# Patient Record
Sex: Female | Born: 1995 | Race: White | Hispanic: No | Marital: Married | Smoking: Never smoker
Health system: Southern US, Community
[De-identification: ages and names within clinical notes are randomized; demographics above are authoritative.]

## PROBLEM LIST (undated history)

## (undated) DIAGNOSIS — B20 Human immunodeficiency virus [HIV] disease: Secondary | ICD-10-CM

## (undated) DIAGNOSIS — Z21 Asymptomatic human immunodeficiency virus [HIV] infection status: Secondary | ICD-10-CM

## (undated) DIAGNOSIS — J45909 Unspecified asthma, uncomplicated: Secondary | ICD-10-CM

## (undated) DIAGNOSIS — E786 Lipoprotein deficiency: Secondary | ICD-10-CM

## (undated) DIAGNOSIS — A6000 Herpesviral infection of urogenital system, unspecified: Secondary | ICD-10-CM

## (undated) HISTORY — DX: Unspecified asthma, uncomplicated: J45.909

## (undated) HISTORY — DX: Herpesviral infection of urogenital system, unspecified: A60.00

## (undated) HISTORY — PX: APPENDECTOMY: SHX54

---
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
## 2013-01-09 ENCOUNTER — Emergency Department: Payer: Self-pay | Admitting: Emergency Medicine

## 2013-01-09 LAB — COMPREHENSIVE METABOLIC PANEL
Albumin: 3.1 g/dL — ABNORMAL LOW (ref 3.8–5.6)
Alkaline Phosphatase: 88 U/L (ref 82–169)
BUN: 9 mg/dL (ref 9–21)
Co2: 25 mmol/L (ref 16–25)
Creatinine: 0.6 mg/dL (ref 0.60–1.30)
EGFR (Non-African Amer.): >60
Sodium: 136 mmol/L (ref 132–141)
Total Protein: 7 g/dL (ref 6.4–8.6)

## 2013-01-09 LAB — CBC
HCT: 31.3 % — ABNORMAL LOW (ref 35.0–47.0)
HGB: 10.9 g/dL — ABNORMAL LOW (ref 12.0–16.0)
MCH: 28.7 pg (ref 26.0–34.0)
MCHC: 34.8 g/dL (ref 32.0–36.0)
MCV: 82 fL (ref 80–100)
RBC: 3.8 10*6/uL (ref 3.80–5.20)
RDW: 13.6 % (ref 11.5–14.5)
WBC: 10.2 10*3/uL (ref 3.6–11.0)

## 2013-01-09 LAB — URINALYSIS, COMPLETE
Nitrite: NEGATIVE
Ph: 7 (ref 4.5–8.0)
Protein: NEGATIVE
RBC,UR: <1 /HPF (ref 0–5)
Squamous Epithelial: <1

## 2013-05-17 DIAGNOSIS — Z5948 Other specified lack of adequate food: Secondary | ICD-10-CM | POA: Insufficient documentation

## 2013-10-06 DIAGNOSIS — B009 Herpesviral infection, unspecified: Secondary | ICD-10-CM | POA: Insufficient documentation

## 2013-10-06 HISTORY — DX: Herpesviral infection, unspecified: B00.9

## 2014-01-20 ENCOUNTER — Emergency Department: Payer: Self-pay | Admitting: Emergency Medicine

## 2014-03-27 ENCOUNTER — Emergency Department: Payer: Self-pay | Admitting: Internal Medicine

## 2014-07-25 ENCOUNTER — Emergency Department
Admit: 2014-07-25 | Disposition: A | Discharge status: Home / Self Care | Payer: Self-pay | Admitting: Emergency Medicine

## 2014-07-25 LAB — CBC
HCT: 40.9 % (ref 35.0–47.0)
HGB: 13.6 g/dL (ref 12.0–16.0)
MCH: 28.3 pg (ref 26.0–34.0)
MCHC: 33.4 g/dL (ref 32.0–36.0)
MCV: 85 fL (ref 80–100)
Platelet: 333 10*3/uL (ref 150–440)
RBC: 4.82 10*6/uL (ref 3.80–5.20)
RDW: 13.1 % (ref 11.5–14.5)
WBC: 7.3 10*3/uL (ref 3.6–11.0)

## 2014-07-25 LAB — BASIC METABOLIC PANEL
Anion Gap: 7 (ref 7–16)
BUN: 15 mg/dL
CREATININE: 0.58 mg/dL
Calcium, Total: 9.4 mg/dL
Chloride: 105 mmol/L
Co2: 27 mmol/L
EGFR (African American): >60
EGFR (Non-African Amer.): >60
Glucose: 87 mg/dL
Potassium: 4 mmol/L
Sodium: 139 mmol/L

## 2014-07-25 LAB — D-DIMER(ARMC): D-Dimer: 132 ng/ml

## 2014-07-25 LAB — TROPONIN I

## 2014-09-08 ENCOUNTER — Emergency Department
Admission: EM | Admit: 2014-09-08 | Discharge: 2014-09-08 | Disposition: A | Discharge status: Home / Self Care | Payer: Medicaid Other | Attending: Emergency Medicine | Admitting: Emergency Medicine

## 2014-09-08 ENCOUNTER — Encounter: Payer: Self-pay | Admitting: Emergency Medicine

## 2014-09-08 DIAGNOSIS — Z880 Allergy status to penicillin: Secondary | ICD-10-CM | POA: Diagnosis not present

## 2014-09-08 DIAGNOSIS — H578 Other specified disorders of eye and adnexa: Secondary | ICD-10-CM | POA: Diagnosis present

## 2014-09-08 DIAGNOSIS — Z79899 Other long term (current) drug therapy: Secondary | ICD-10-CM | POA: Diagnosis not present

## 2014-09-08 DIAGNOSIS — H109 Unspecified conjunctivitis: Secondary | ICD-10-CM | POA: Insufficient documentation

## 2014-09-08 HISTORY — DX: Asymptomatic human immunodeficiency virus (hiv) infection status: Z21

## 2014-09-08 HISTORY — DX: Human immunodeficiency virus (HIV) disease: B20

## 2014-09-08 MED ORDER — TOBRAMYCIN 0.3 % OP SOLN: 1.0000 [drp] | OPHTHALMIC | Status: AC

## 2014-09-08 MED ORDER — TETRACAINE HCL 0.5 % OP SOLN
OPHTHALMIC | Status: AC
  Filled 2014-09-08: qty 2

## 2014-09-08 MED ORDER — EYE WASH OPHTH SOLN
OPHTHALMIC | Status: AC
  Filled 2014-09-08: qty 118

## 2014-09-08 MED ORDER — FLUORESCEIN SODIUM 1 MG OP STRP
ORAL_STRIP | OPHTHALMIC | Status: AC
  Filled 2014-09-08: qty 2

## 2014-09-08 NOTE — ED Notes (Signed)
Sclera reddened 

## 2014-09-08 NOTE — ED Notes (Signed)
Noted to keep L eye closed more than R.

## 2014-09-08 NOTE — ED Provider Notes (Signed)
CSN: [id number]     Arrival date & time 09/08/14  1103 History   First MD Initiated Contact with Patient 09/08/14 1138     Chief Complaint  Patient presents with  . Eye Problem    L eye irritation x 3 days, denies pain, itching or visual disturbance, sent by boss for clearance for work     (Consider location/radiation/quality/duration/timing/severity/associated sxs/prior Treatment) HPI Comments: 19 year old HIV positive female presents today for left eye irritation and swelling for the past few days. She has chronic issues with decreased far vision in her left eye. No injury that she is aware of to the eye. She is not having any blurred vision or eye pain.   Patient is a 19 y.o. female presenting with eye problem. The history is provided by the patient.  Eye Problem Location:  L eye Quality:  Aching Severity:  Mild Onset quality:  Gradual Duration:  3 days Timing:  Constant Progression:  Worsening Chronicity:  New Context: not burn, not chemical exposure, not contact lens problem, not direct trauma, not foreign body, not using machinery, not scratch, not smoke exposure and not tanning booth use   Relieved by:  Nothing Worsened by:  Bright light Ineffective treatments:  None tried Associated symptoms: crusting, discharge, inflammation, itching, swelling and tearing   Associated symptoms: no blurred vision, no decreased vision, no double vision, no foreign body sensation, no photophobia and no vomiting   Risk factors: not exposed to pinkeye, no previous injury to eye and no recent URI     Past Medical History  Diagnosis Date  . HIV (human immunodeficiency virus infection)    History reviewed. No pertinent past surgical history. No family history on file. History  Substance Use Topics  . Smoking status: Never Smoker   . Smokeless tobacco: Not on file  . Alcohol Use: No   OB History    No data available     Review of Systems  Eyes: Positive for discharge and itching.  Negative for blurred vision, double vision and photophobia.  Gastrointestinal: Negative for vomiting.  All other systems reviewed and are negative.     Allergies  Penicillins  Home Medications   Prior to Admission medications   Medication Sig Start Date End Date Taking? Authorizing Provider  darunavir (PREZISTA) 400 MG tablet Take 400 mg by mouth.   Yes Historical Provider, MD  emtricitabine-tenofovir (TRUVADA) 200-300 MG per tablet Take 1 tablet by mouth daily.   Yes Historical Provider, MD  tobramycin (TOBREX) 0.3 % ophthalmic solution Place 1 drop into the left eye every 4 (four) hours. 09/08/14 09/18/14  Wilber Oliphant V, PA-C   BP 106/57 mmHg  Pulse 102  Temp(Src) 99.1 F (37.3 C) (Oral)  Resp 18  Ht  (1.448 m)  Wt 100 lb (45.36 kg)  BMI 21.63 kg/m2  SpO2 99%  LMP 08/15/2014 (Exact Date) Physical Exam  Constitutional: She is oriented to person, place, and time. She appears well-developed and well-nourished.  HENT:  Head: Normocephalic and atraumatic.  Right Ear: Tympanic membrane and external ear normal.  Left Ear: Tympanic membrane and external ear normal.  Nose: Nose normal.  Mouth/Throat: Oropharynx is clear and moist.  Eyes: EOM are normal. Pupils are equal, round, and reactive to light. Lids are everted and swept, no foreign bodies found. Right eye exhibits no discharge. Left eye exhibits discharge. Left conjunctiva is injected.  Slit lamp exam:      The right eye shows no fluorescein uptake.  The left eye shows no corneal abrasion, no corneal flare, no corneal ulcer, no foreign body and no fluorescein uptake.  Neck: Normal range of motion. Neck supple.  Musculoskeletal: Normal range of motion.  Neurological: She is alert and oriented to person, place, and time.  Skin: Skin is warm and dry.  Psychiatric: She has a normal mood and affect. Her behavior is normal. Judgment and thought content normal.  Nursing note and vitals reviewed.   ED Course   Procedures (including critical care time) Labs Review Labs Reviewed - No data to display  Imaging Review No results found.   EKG Interpretation None      MDM  Fluorescein exam performed, no obvious injury to eye. Treat for conjunctivitis. Tobrex 10 day course.Return for eye pain, vision problems, vomiting  Final diagnoses:  Conjunctivitis, left eye       Luvenia Reddenmma Weavil V, PA-C 09/08/14 1153  Governor Rooksebecca Lord, MD 09/10/14 [phone]

## 2014-09-08 NOTE — Discharge Instructions (Signed)

## 2014-10-01 ENCOUNTER — Emergency Department
Admission: EM | Admit: 2014-10-01 | Discharge: 2014-10-01 | Disposition: A | Discharge status: Home / Self Care | Payer: Medicaid Other | Attending: Emergency Medicine | Admitting: Emergency Medicine

## 2014-10-01 DIAGNOSIS — Z880 Allergy status to penicillin: Secondary | ICD-10-CM | POA: Diagnosis not present

## 2014-10-01 DIAGNOSIS — J069 Acute upper respiratory infection, unspecified: Secondary | ICD-10-CM | POA: Insufficient documentation

## 2014-10-01 DIAGNOSIS — Z21 Asymptomatic human immunodeficiency virus [HIV] infection status: Secondary | ICD-10-CM | POA: Insufficient documentation

## 2014-10-01 DIAGNOSIS — Z79899 Other long term (current) drug therapy: Secondary | ICD-10-CM | POA: Diagnosis not present

## 2014-10-01 DIAGNOSIS — R05 Cough: Secondary | ICD-10-CM | POA: Diagnosis present

## 2014-10-01 MED ORDER — AZITHROMYCIN 250 MG PO TABS
ORAL_TABLET | ORAL | Status: AC
  Administered 2014-10-01: 500 mg via ORAL
  Filled 2014-10-01: qty 2

## 2014-10-01 MED ORDER — AZITHROMYCIN 250 MG PO TABS: 250.0000 mg | ORAL_TABLET | Freq: Every day | ORAL | Status: DC

## 2014-10-01 MED ORDER — AZITHROMYCIN 250 MG PO TABS
500.0000 mg | ORAL_TABLET | Freq: Once | ORAL | Status: AC
  Administered 2014-10-01: 500 mg via ORAL

## 2014-10-01 MED ORDER — HYDROCOD POLST-CPM POLST ER 10-8 MG/5ML PO SUER: 5.0000 mL | Freq: Two times a day (BID) | ORAL | Status: DC

## 2014-10-01 NOTE — Discharge Instructions (Signed)
1. Finish antibiotics as prescribed (erythromycin 250 mg daily 4 days). 2. Take cough medicine as needed (Tussionex). 3. Return to the ER for worsening symptoms, persistent vomiting, fever, difficulty breathing or other concerns.  Upper Respiratory Infection, Adult An upper respiratory infection (URI) is also sometimes known as the common cold. The upper respiratory tract includes the nose, sinuses, throat, trachea, and bronchi. Bronchi are the airways leading to the lungs. Most people improve within 1 week, but symptoms can last up to 2 weeks. A residual cough may last even longer.  CAUSES Many different viruses can infect the tissues lining the upper respiratory tract. The tissues become irritated and inflamed and often become very moist. Mucus production is also common. A cold is contagious. You can easily spread the virus to others by oral contact. This includes kissing, sharing a glass, coughing, or sneezing. Touching your mouth or nose and then touching a surface, which is then touched by another person, can also spread the virus. SYMPTOMS  Symptoms typically develop 1 to 3 days after you come in contact with a cold virus. Symptoms vary from person to person. They may include:  Runny nose.  Sneezing.  Nasal congestion.  Sinus irritation.  Sore throat.  Loss of voice (laryngitis).  Cough.  Fatigue.  Muscle aches.  Loss of appetite.  Headache.  Low-grade fever. DIAGNOSIS  You might diagnose your own cold based on familiar symptoms, since most people get a cold 2 to 3 times a year. Your caregiver can confirm this based on your exam. Most importantly, your caregiver can check that your symptoms are not due to another disease such as strep throat, sinusitis, pneumonia, asthma, or epiglottitis. Blood tests, throat tests, and X-rays are not necessary to diagnose a common cold, but they may sometimes be helpful in excluding other more serious diseases. Your caregiver will decide if  any further tests are required. RISKS AND COMPLICATIONS  You may be at risk for a more severe case of the common cold if you smoke cigarettes, have chronic heart disease (such as heart failure) or lung disease (such as asthma), or if you have a weakened immune system. The very young and very old are also at risk for more serious infections. Bacterial sinusitis, middle ear infections, and bacterial pneumonia can complicate the common cold. The common cold can worsen asthma and chronic obstructive pulmonary disease (COPD). Sometimes, these complications can require emergency medical care and may be life-threatening. PREVENTION  The best way to protect against getting a cold is to practice good hygiene. Avoid oral or hand contact with people with cold symptoms. Wash your hands often if contact occurs. There is no clear evidence that vitamin C, vitamin E, echinacea, or exercise reduces the chance of developing a cold. However, it is always recommended to get plenty of rest and practice good nutrition. TREATMENT  Treatment is directed at relieving symptoms. There is no cure. Antibiotics are not effective, because the infection is caused by a virus, not by bacteria. Treatment may include:  Increased fluid intake. Sports drinks offer valuable electrolytes, sugars, and fluids.  Breathing heated mist or steam (vaporizer or shower).  Eating chicken soup or other clear broths, and maintaining good nutrition.  Getting plenty of rest.  Using gargles or lozenges for comfort.  Controlling fevers with ibuprofen or acetaminophen as directed by your caregiver.  Increasing usage of your inhaler if you have asthma. Zinc gel and zinc lozenges, taken in the first 24 hours of the common cold, can  shorten the duration and lessen the severity of symptoms. Pain medicines may help with fever, muscle aches, and throat pain. A variety of non-prescription medicines are available to treat congestion and runny nose. Your  caregiver can make recommendations and may suggest nasal or lung inhalers for other symptoms.  HOME CARE INSTRUCTIONS   Only take over-the-counter or prescription medicines for pain, discomfort, or fever as directed by your caregiver.  Use a warm mist humidifier or inhale steam from a shower to increase air moisture. This may keep secretions moist and make it easier to breathe.  Drink enough water and fluids to keep your urine clear or pale yellow.  Rest as needed.  Return to work when your temperature has returned to normal or as your caregiver advises. You may need to stay home longer to avoid infecting others. You can also use a face mask and careful hand washing to prevent spread of the virus. SEEK MEDICAL CARE IF:   After the first few days, you feel you are getting worse rather than better.  You need your caregiver's advice about medicines to control symptoms.  You develop chills, worsening shortness of breath, or brown or red sputum. These may be signs of pneumonia.  You develop yellow or brown nasal discharge or pain in the face, especially when you bend forward. These may be signs of sinusitis.  You develop a fever, swollen neck glands, pain with swallowing, or white areas in the back of your throat. These may be signs of strep throat. SEEK IMMEDIATE MEDICAL CARE IF:   You have a fever.  You develop severe or persistent headache, ear pain, sinus pain, or chest pain.  You develop wheezing, a prolonged cough, cough up blood, or have a change in your usual mucus (if you have chronic lung disease).  You develop sore muscles or a stiff neck. Document Released: 09/30/2000 Document Revised: 06/29/2011 Document Reviewed: 07/12/2013 [facility] Patient Information 2015 [place], [place]. This information is not intended to replace advice given to you by your health care provider. Make sure you discuss any questions you have with your health care provider.

## 2014-10-01 NOTE — ED Notes (Signed)
Pt states that she has been coughing, hoarse, and some diarrhea, daughter here with similar complaints

## 2014-10-01 NOTE — ED Provider Notes (Signed)
[facility] Emergency Department Provider Note  ____________________________________________  Time seen: Approximately 5:32 AM  I have reviewed the triage vital signs and the nursing notes.   HISTORY  Chief Complaint Cough    HPI Erica Walton is a 19 y.o. female who presents with a one-week history of worsening fever, cough, sore throat and diarrhea. Patient states she had fevers and chills for the first day of her illness, none subsequently. Cough has progressed to a productive one of yellow sputum with sore throat, hoarseness and occasional diarrhea. Her daughter is here with similar complaints. Denies headache, chest pain, abdominal pain, vomiting, weakness, numbness, tingling.Denies recent foreign travel.   Past Medical History  Diagnosis Date  . HIV (human immunodeficiency virus infection)     There are no active problems to display for this patient.   No past surgical history on file.  Current Outpatient Rx  Name  Route  Sig  Dispense  Refill  . darunavir (PREZISTA) 400 MG tablet   Oral   Take 400 mg by mouth.         Marland Kitchen emtricitabine-tenofovir (TRUVADA) 200-300 MG per tablet   Oral   Take 1 tablet by mouth daily.           Allergies Penicillins  No family history on file.  Social History History  Substance Use Topics  . Smoking status: Never Smoker   . Smokeless tobacco: Not on file  . Alcohol Use: No    Review of Systems Constitutional: No fever/chills Eyes: No visual changes. ENT: Positive for sore throat. Cardiovascular: Denies chest pain. Respiratory: Positive for productive cough. Denies shortness of breath. Gastrointestinal: No abdominal pain.  No nausea, no vomiting.  No diarrhea.  No constipation. Genitourinary: Negative for dysuria. Musculoskeletal: Negative for back pain. Skin: Negative for rash. Neurological: Negative for headaches, focal weakness or numbness.  10-point ROS otherwise  negative.  ____________________________________________   PHYSICAL EXAM:  VITAL SIGNS: ED Triage Vitals  Enc Vitals Group     BP 10/01/14 0101 109/74 mmHg     Pulse Rate 10/01/14 0101 87     Resp --      Temp 10/01/14 0101 98.8 F (37.1 C)     Temp Source 10/01/14 0101 Oral     SpO2 10/01/14 0101 97 %     Weight 10/01/14 0101 95 lb (43.092 kg)     Height 10/01/14 0101 4\' 10"  (1.473 m)     Head Cir --      Peak Flow --      Pain Score 10/01/14 0102 8     Pain Loc --      Pain Edu? --      Excl. in GC? --     Constitutional: Alert and oriented. Well appearing and in no acute distress. Eyes: Conjunctivae are normal. PERRL. EOMI. Head: Atraumatic. Nose: No congestion/rhinnorhea. Mouth/Throat: Mucous membranes are moist.  Oropharynx mildly erythematous without tonsillar swelling. No peritonsillar abscess. No drooling. No hoarse voice.. Neck: No stridor.   Cardiovascular: Normal rate, regular rhythm. Grossly normal heart sounds.  Good peripheral circulation. Respiratory: Normal respiratory effort.  No retractions. Lungs CTAB. Gastrointestinal: Soft and nontender. No distention. No abdominal bruits. No CVA tenderness. Musculoskeletal: No lower extremity tenderness nor edema.  No joint effusions. Neurologic:  Normal speech and language. No gross focal neurologic deficits are appreciated. Speech is normal. No gait instability. Skin:  Skin is warm, dry and intact. No rash noted. Psychiatric: Mood and affect are normal. Speech  and behavior are normal.  ____________________________________________   LABS (all labs ordered are listed, but only abnormal results are displayed)  Labs Reviewed - No data to display ____________________________________________  EKG  None ____________________________________________  RADIOLOGY  None ____________________________________________   PROCEDURES  Procedure(s) performed: None  Critical Care performed:  No  ____________________________________________   INITIAL IMPRESSION / ASSESSMENT AND PLAN / ED COURSE  Pertinent labs & imaging results that were available during my care of the patient were reviewed by me and considered in my medical decision making (see chart for details).  19 year old female with worsening cough and congestion for one week. Afebrile, room air sats 97%. Will treat with Z-Pak, Tussionex, follow-up PCP. Strict return precautions given. Patient verbalizes understanding and agree with plan of care. ____________________________________________   FINAL CLINICAL IMPRESSION(S) / ED DIAGNOSES  Final diagnoses:  URI (upper respiratory infection)      Irean Hong, MD 10/01/14 0745

## 2015-04-03 ENCOUNTER — Emergency Department: Payer: Medicaid Other

## 2015-04-03 ENCOUNTER — Encounter: Payer: Self-pay | Admitting: Emergency Medicine

## 2015-04-03 ENCOUNTER — Emergency Department
Admission: EM | Admit: 2015-04-03 | Discharge: 2015-04-04 | Disposition: A | Discharge status: Home / Self Care | Payer: Medicaid Other | Attending: Emergency Medicine | Admitting: Emergency Medicine

## 2015-04-03 DIAGNOSIS — Z79899 Other long term (current) drug therapy: Secondary | ICD-10-CM | POA: Insufficient documentation

## 2015-04-03 DIAGNOSIS — S0083XA Contusion of other part of head, initial encounter: Secondary | ICD-10-CM

## 2015-04-03 DIAGNOSIS — S0990XA Unspecified injury of head, initial encounter: Secondary | ICD-10-CM | POA: Diagnosis present

## 2015-04-03 DIAGNOSIS — Y998 Other external cause status: Secondary | ICD-10-CM | POA: Diagnosis not present

## 2015-04-03 DIAGNOSIS — S060X1A Concussion with loss of consciousness of 30 minutes or less, initial encounter: Secondary | ICD-10-CM | POA: Diagnosis not present

## 2015-04-03 DIAGNOSIS — W01198A Fall on same level from slipping, tripping and stumbling with subsequent striking against other object, initial encounter: Secondary | ICD-10-CM | POA: Diagnosis not present

## 2015-04-03 DIAGNOSIS — Z880 Allergy status to penicillin: Secondary | ICD-10-CM | POA: Insufficient documentation

## 2015-04-03 DIAGNOSIS — Y9389 Activity, other specified: Secondary | ICD-10-CM | POA: Diagnosis not present

## 2015-04-03 DIAGNOSIS — Y9289 Other specified places as the place of occurrence of the external cause: Secondary | ICD-10-CM | POA: Diagnosis not present

## 2015-04-03 LAB — CBC WITH DIFFERENTIAL/PLATELET
BASOS ABS: 0 10*3/uL (ref 0–0.1)
BASOS PCT: 0 %
EOS ABS: 0.4 10*3/uL (ref 0–0.7)
Eosinophils Relative: 3 %
HEMATOCRIT: 43 % (ref 35.0–47.0)
HEMOGLOBIN: 14.4 g/dL (ref 12.0–16.0)
Lymphocytes Relative: 14 %
Lymphs Abs: 1.9 10*3/uL (ref 1.0–3.6)
MCH: 28.1 pg (ref 26.0–34.0)
MCHC: 33.4 g/dL (ref 32.0–36.0)
MCV: 84.2 fL (ref 80.0–100.0)
MONOS PCT: 5 %
Monocytes Absolute: 0.7 10*3/uL (ref 0.2–0.9)
NEUTROS ABS: 10.4 10*3/uL — AB (ref 1.4–6.5)
NEUTROS PCT: 78 %
Platelets: 315 10*3/uL (ref 150–440)
RBC: 5.11 MIL/uL (ref 3.80–5.20)
RDW: 12.3 % (ref 11.5–14.5)
WBC: 13.5 10*3/uL — AB (ref 3.6–11.0)

## 2015-04-03 LAB — BASIC METABOLIC PANEL
Anion gap: 4 — ABNORMAL LOW (ref 5–15)
BUN: 11 mg/dL (ref 6–20)
CALCIUM: 9.2 mg/dL (ref 8.9–10.3)
CHLORIDE: 110 mmol/L (ref 101–111)
CO2: 24 mmol/L (ref 22–32)
Creatinine, Ser: 0.57 mg/dL (ref 0.44–1.00)
GFR calc Af Amer: >60 mL/min (ref >60)
GFR calc non Af Amer: >60 mL/min (ref >60)
Glucose, Bld: 103 mg/dL — ABNORMAL HIGH (ref 65–99)
POTASSIUM: 4 mmol/L (ref 3.5–5.1)
Sodium: 138 mmol/L (ref 135–145)

## 2015-04-03 LAB — ETHANOL

## 2015-04-03 NOTE — ED Notes (Signed)
Pt arrived to the ED accompanied by family member for a head injury after sustaining a fall. Pt is Alert but not oriented. ED MD notified.

## 2015-04-03 NOTE — ED Provider Notes (Signed)
[facility] Emergency Department Provider Note   ____________________________________________  Time seen: Upon arrival I have reviewed the triage vital signs and the triage nursing note.  HISTORY  Chief Complaint Head Injury   Historian Patient is altered mental status, history obtained from roommate  HPI Erica Walton is a 19 y.o. female was brought in by roommate after head injury. Reportedly the patient slipped on the floor and struck her head against the wall. Reported short loss of consciousness followed by confusion which is persisted. Currently patient is confused but does state she has a headache. No additional reported recent illnesses.    Past Medical History  Diagnosis Date  . HIV (human immunodeficiency virus infection) (HCC)     There are no active problems to display for this patient.   No past surgical history on file.  Current Outpatient Rx  Name  Route  Sig  Dispense  Refill  . darunavir (PREZISTA) 400 MG tablet   Oral   Take 400 mg by mouth.         Marland Kitchen emtricitabine-tenofovir (TRUVADA) 200-300 MG per tablet   Oral   Take 1 tablet by mouth daily.           Allergies Penicillins  No family history on file.  Social History Social History  Substance Use Topics  . Smoking status: Never Smoker   . Smokeless tobacco: None  . Alcohol Use: No    Review of Systems Limited due to altered mental status Constitutional:  Eyes:  ENT: Cardiovascular:  Respiratory:  Gastrointestinal: Genitourinary: Musculoskeletal:  Skin: Neurological: Positive for headache. 10 point Review of Systems otherwise negative ____________________________________________   PHYSICAL EXAM:  VITAL SIGNS: ED Triage Vitals  Enc Vitals Group     BP 04/03/15 2231 108/75 mmHg     Pulse Rate 04/03/15 2231 102     Resp 04/03/15 2231 16     Temp 04/03/15 2231 97.8 F (36.6 C)     Temp Source 04/03/15 2231 Oral     SpO2 04/03/15 2231 98 %      Weight 04/03/15 2231 115 lb (52.164 kg)     Height 04/03/15 2231 5' (1.524 m)     Head Cir --      Peak Flow --      Pain Score --      Pain Loc --      Pain Edu? --      Excl. in GC? --      Constitutional: Alert, confused.  In no distress. Eyes: Conjunctivae are normal. PERRL. Normal extraocular movements. ENT   Head: Forehead hematoma extending to the bridge of nose. No nasal septal hematoma.   Nose: No congestion/rhinnorhea.   Mouth/Throat: Mucous membranes are moist.   Neck: No stridor. No midline she's been tenderness or step-off. Cardiovascular/Chest: Normal rate, regular rhythm.  No murmurs, rubs, or gallops. Respiratory: Normal respiratory effort without tachypnea nor retractions. Breath sounds are clear and equal bilaterally. No wheezes/rales/rhonchi. Gastrointestinal: Soft. No distention, no guarding, no rebound. Nontender  Genitourinary/rectal:Deferred Musculoskeletal: Nontender with normal range of motion in all extremities. No joint effusions.  No lower extremity tenderness.  No edema. Neurologic:  Confused and repetitive questions, but no slurred speech. No gross or focal neurologic deficits are appreciated. Skin:  Skin is warm, dry and intact. No rash noted.  ____________________________________________   EKG I, Governor Rooks, MD, the attending physician have personally viewed and interpreted all ECGs.  None ____________________________________________  LABS (pertinent positives/negatives)  Basic  metabolic panel without significant abnormalities Alcohol less than 5 White blood count 13.5 and mild left shift, hemoglobin 14.4 and platelet count 3:15  ____________________________________________  RADIOLOGY All Xrays were viewed by me. Imaging interpreted by Radiologist.  CT head noncontrast: Small left frontal scalp hematoma. No skull fracture. Negative noncontrast head CT CT cervical spine: Strained cervical lordosis without acute fracture  or malalignment. __________________________________________  PROCEDURES  Procedure(s) performed: None  Critical Care performed: None  ____________________________________________   ED COURSE / ASSESSMENT AND PLAN  CONSULTATIONS: None  Pertinent labs & imaging results that were available during my care of the patient were reviewed by me and considered in my medical decision making (see chart for details).  Patient arrived with reported head injury, and she is confused consistent with the severe concussion.  CT head negative for intracranial traumatic injury.  Confusion I do think is likely due to concussion, and laboratory evaluation is reassuring. Urinalysis and urine drug screen are pending. Disposition to be determined after reevaluation and once urine tests are complete  Patient transferred to Dr. Manson PasseyBrown at midnight. Urinalysis, urine drug screen and disposition are pending.  Patient / Family / Caregiver informed of clinical course, medical decision-making process, and agree with plan.   ___________________________________________   FINAL CLINICAL IMPRESSION(S) / ED DIAGNOSES   Final diagnoses:  Concussion, with loss of consciousness of 30 minutes or less, initial encounter  Hematoma of face, initial encounter       Governor Rooksebecca Kanton Kamel, MD 04/04/15 0000

## 2015-04-04 LAB — URINALYSIS COMPLETE WITH MICROSCOPIC (ARMC ONLY)
BILIRUBIN URINE: NEGATIVE
Glucose, UA: NEGATIVE mg/dL
HGB URINE DIPSTICK: NEGATIVE
KETONES UR: NEGATIVE mg/dL
NITRITE: NEGATIVE
Protein, ur: NEGATIVE mg/dL
Specific Gravity, Urine: 1.011 (ref 1.005–1.030)
pH: 7 (ref 5.0–8.0)

## 2015-04-04 LAB — URINE DRUG SCREEN, QUALITATIVE (ARMC ONLY)
Amphetamines, Ur Screen: NOT DETECTED
Barbiturates, Ur Screen: NOT DETECTED
Benzodiazepine, Ur Scrn: NOT DETECTED
Cannabinoid 50 Ng, Ur ~~LOC~~: NOT DETECTED
Cocaine Metabolite,Ur ~~LOC~~: NOT DETECTED
MDMA (ECSTASY) UR SCREEN: NOT DETECTED
Methadone Scn, Ur: NOT DETECTED
Opiate, Ur Screen: NOT DETECTED
Phencyclidine (PCP) Ur S: NOT DETECTED
TRICYCLIC, UR SCREEN: NOT DETECTED

## 2015-04-04 MED ORDER — IBUPROFEN 800 MG PO TABS
800.0000 mg | ORAL_TABLET | Freq: Once | ORAL | Status: AC
  Administered 2015-04-04: 800 mg via ORAL
  Filled 2015-04-04: qty 1

## 2015-04-04 NOTE — Discharge Instructions (Signed)
Concussion, Adult  A concussion, or closed-head injury, is a brain injury caused by a direct blow to the head or by a quick and sudden movement (jolt) of the head or neck. Concussions are usually not life-threatening. Even so, the effects of a concussion can be serious. If you have had a concussion before, you are more likely to experience concussion-like symptoms after a direct blow to the head.   CAUSES  · Direct blow to the head, such as from running into another player during a soccer game, being hit in a fight, or hitting your head on a hard surface.  · A jolt of the head or neck that causes the brain to move back and forth inside the skull, such as in a car crash.  SIGNS AND SYMPTOMS  The signs of a concussion can be hard to notice. Early on, they may be missed by you, family members, and health care providers. You may look fine but act or feel differently.  Symptoms are usually temporary, but they may last for days, weeks, or even longer. Some symptoms may appear right away while others may not show up for hours or days. Every head injury is different. Symptoms include:  · Mild to moderate headaches that will not go away.  · A feeling of pressure inside your head.  · Having more trouble than usual:    Learning or remembering things you have heard.    Answering questions.    Paying attention or concentrating.    Organizing daily tasks.    Making decisions and solving problems.  · Slowness in thinking, acting or reacting, speaking, or reading.  · Getting lost or being easily confused.  · Feeling tired all the time or lacking energy (fatigued).  · Feeling drowsy.  · Sleep disturbances.    Sleeping more than usual.    Sleeping less than usual.    Trouble falling asleep.    Trouble sleeping (insomnia).  · Loss of balance or feeling lightheaded or dizzy.  · Nausea or vomiting.  · Numbness or tingling.  · Increased sensitivity to:    Sounds.    Lights.    Distractions.  · Vision problems or eyes that tire  easily.  · Diminished sense of taste or smell.  · Ringing in the ears.  · Mood changes such as feeling sad or anxious.  · Becoming easily irritated or angry for little or no reason.  · Lack of motivation.  · Seeing or hearing things other people do not see or hear (hallucinations).  DIAGNOSIS  Your health care provider can usually diagnose a concussion based on a description of your injury and symptoms. He or she will ask whether you passed out (lost consciousness) and whether you are having trouble remembering events that happened right before and during your injury.  Your evaluation might include:  · A brain scan to look for signs of injury to the brain. Even if the test shows no injury, you may still have a concussion.  · Blood tests to be sure other problems are not present.  TREATMENT  · Concussions are usually treated in an emergency department, in urgent care, or at a clinic. You may need to stay in the hospital overnight for further treatment.  · Tell your health care provider if you are taking any medicines, including prescription medicines, over-the-counter medicines, and natural remedies. Some medicines, such as blood thinners (anticoagulants) and aspirin, may increase the chance of complications. Also tell your health care   provider whether you have had alcohol or are taking illegal drugs. This information may affect treatment.  · Your health care provider will send you home with important instructions to follow.  · How fast you will recover from a concussion depends on many factors. These factors include how severe your concussion is, what part of your brain was injured, your age, and how healthy you were before the concussion.  · Most people with mild injuries recover fully. Recovery can take time. In general, recovery is slower in older persons. Also, persons who have had a concussion in the past or have other medical problems may find that it takes longer to recover from their current injury.  HOME  CARE INSTRUCTIONS  General Instructions  · Carefully follow the directions your health care provider gave you.  · Only take over-the-counter or prescription medicines for pain, discomfort, or fever as directed by your health care provider.  · Take only those medicines that your health care provider has approved.  · Do not drink alcohol until your health care provider says you are well enough to do so. Alcohol and certain other drugs may slow your recovery and can put you at risk of further injury.  · If it is harder than usual to remember things, write them down.  · If you are easily distracted, try to do one thing at a time. For example, do not try to watch TV while fixing dinner.  · Talk with family members or close friends when making important decisions.  · Keep all follow-up appointments. Repeated evaluation of your symptoms is recommended for your recovery.  · Watch your symptoms and tell others to do the same. Complications sometimes occur after a concussion. Older adults with a brain injury may have a higher risk of serious complications, such as a blood clot on the brain.  · Tell your teachers, school nurse, school counselor, coach, athletic trainer, or work manager about your injury, symptoms, and restrictions. Tell them about what you can or cannot do. They should watch for:    Increased problems with attention or concentration.    Increased difficulty remembering or learning new information.    Increased time needed to complete tasks or assignments.    Increased irritability or decreased ability to cope with stress.    Increased symptoms.  · Rest. Rest helps the brain to heal. Make sure you:    Get plenty of sleep at night. Avoid staying up late at night.    Keep the same bedtime hours on weekends and weekdays.    Rest during the day. Take daytime naps or rest breaks when you feel tired.  · Limit activities that require a lot of thought or concentration. These include:    Doing homework or job-related  work.    Watching TV.    Working on the computer.  · Avoid any situation where there is potential for another head injury (football, hockey, soccer, basketball, martial arts, downhill snow sports and horseback riding). Your condition will get worse every time you experience a concussion. You should avoid these activities until you are evaluated by the appropriate follow-up health care providers.  Returning To Your Regular Activities  You will need to return to your normal activities slowly, not all at once. You must give your body and brain enough time for recovery.  · Do not return to sports or other athletic activities until your health care provider tells you it is safe to do so.  · Ask   your health care provider when you can drive, ride a bicycle, or operate heavy machinery. Your ability to react may be slower after a brain injury. Never do these activities if you are dizzy.  · Ask your health care provider about when you can return to work or school.  Preventing Another Concussion  It is very important to avoid another brain injury, especially before you have recovered. In rare cases, another injury can lead to permanent brain damage, brain swelling, or death. The risk of this is greatest during the first 7-10 days after a head injury. Avoid injuries by:  · Wearing a seat belt when riding in a car.  · Drinking alcohol only in moderation.  · Wearing a helmet when biking, skiing, skateboarding, skating, or doing similar activities.  · Avoiding activities that could lead to a second concussion, such as contact or recreational sports, until your health care provider says it is okay.  · Taking safety measures in your home.    Remove clutter and tripping hazards from floors and stairways.    Use grab bars in bathrooms and handrails by stairs.    Place non-slip mats on floors and in bathtubs.    Improve lighting in dim areas.  SEEK MEDICAL CARE IF:  · You have increased problems paying attention or  concentrating.  · You have increased difficulty remembering or learning new information.  · You need more time to complete tasks or assignments than before.  · You have increased irritability or decreased ability to cope with stress.  · You have more symptoms than before.  Seek medical care if you have any of the following symptoms for more than 2 weeks after your injury:  · Lasting (chronic) headaches.  · Dizziness or balance problems.  · Nausea.  · Vision problems.  · Increased sensitivity to noise or light.  · Depression or mood swings.  · Anxiety or irritability.  · Memory problems.  · Difficulty concentrating or paying attention.  · Sleep problems.  · Feeling tired all the time.  SEEK IMMEDIATE MEDICAL CARE IF:  · You have severe or worsening headaches. These may be a sign of a blood clot in the brain.  · You have weakness (even if only in one hand, leg, or part of the face).  · You have numbness.  · You have decreased coordination.  · You vomit repeatedly.  · You have increased sleepiness.  · One pupil is larger than the other.  · You have convulsions.  · You have slurred speech.  · You have increased confusion. This may be a sign of a blood clot in the brain.  · You have increased restlessness, agitation, or irritability.  · You are unable to recognize people or places.  · You have neck pain.  · It is difficult to wake you up.  · You have unusual behavior changes.  · You lose consciousness.  MAKE SURE YOU:  · Understand these instructions.  · Will watch your condition.  · Will get help right away if you are not doing well or get worse.     This information is not intended to replace advice given to you by your health care provider. Make sure you discuss any questions you have with your health care provider.     Document Released: 06/27/2003 Document Revised: 04/27/2014 Document Reviewed: 10/27/2012  Elsevier Interactive Patient Education ©2016 [employer]

## 2015-04-04 NOTE — ED Provider Notes (Signed)
I assumed care of the patient at 12:00 AM from Dr. Shaune PollackLord. Patient currently alert and oriented 4. CT scan of the head revealed CT Head Wo Contrast (Final result) Result time: 04/03/15 22:54:23   Final result by Rad Results In Interface (04/03/15 22:54:23)   Narrative:   CLINICAL DATA: Fall, head injury. Alert but not oriented.  EXAM: CT HEAD WITHOUT CONTRAST  CT CERVICAL SPINE WITHOUT CONTRAST  TECHNIQUE: Multidetector CT imaging of the head and cervical spine was performed following the standard protocol without intravenous contrast. Multiplanar CT image reconstructions of the cervical spine were also generated.  COMPARISON: None.  FINDINGS: CT HEAD FINDINGS  The ventricles and sulci are normal. No intraparenchymal hemorrhage, mass effect nor midline shift. No acute large vascular territory infarcts.  No abnormal extra-axial fluid collections. Basal cisterns are patent.  No skull fracture. Small LEFT frontal scalp hematoma without subcutaneous gas or radiopaque foreign bodies. The included ocular globes and orbital contents are non-suspicious. The mastoid aircells and included paranasal sinuses are well-aerated. Prominent adenoidal soft tissues in keeping with patient's provided young age.  CT CERVICAL SPINE FINDINGS  Cervical vertebral bodies and posterior elements are intact and aligned with straightened cervical lordosis. Intervertebral disc heights preserved. No destructive bony lesions. C1-2 articulation maintained. Included prevertebral and paraspinal soft tissues are unremarkable.  IMPRESSION: CT HEAD: Small LEFT frontal scalp hematoma. No skull fracture.  Negative noncontrast CT head.  CT CERVICAL SPINE: Straightened cervical lordosis without acute fracture or or malalignment.   Electronically Signed By: Awilda Metroourtnay Bloomer M.D. On: 04/03/2015 22:54          CT Cervical Spine Wo Contrast (Final result) Result time: 04/03/15 22:54:23    Final result by Rad Results In Interface (04/03/15 22:54:23)   Narrative:   CLINICAL DATA: Fall, head injury. Alert but not oriented.  EXAM: CT HEAD WITHOUT CONTRAST  CT CERVICAL SPINE WITHOUT CONTRAST  TECHNIQUE: Multidetector CT imaging of the head and cervical spine was performed following the standard protocol without intravenous contrast. Multiplanar CT image reconstructions of the cervical spine were also generated.  COMPARISON: None.  FINDINGS: CT HEAD FINDINGS  The ventricles and sulci are normal. No intraparenchymal hemorrhage, mass effect nor midline shift. No acute large vascular territory infarcts.  No abnormal extra-axial fluid collections. Basal cisterns are patent.  No skull fracture. Small LEFT frontal scalp hematoma without subcutaneous gas or radiopaque foreign bodies. The included ocular globes and orbital contents are non-suspicious. The mastoid aircells and included paranasal sinuses are well-aerated. Prominent adenoidal soft tissues in keeping with patient's provided young age.  CT CERVICAL SPINE FINDINGS  Cervical vertebral bodies and posterior elements are intact and aligned with straightened cervical lordosis. Intervertebral disc heights preserved. No destructive bony lesions. C1-2 articulation maintained. Included prevertebral and paraspinal soft tissues are unremarkable.  IMPRESSION: CT HEAD: Small LEFT frontal scalp hematoma. No skull fracture.  Negative noncontrast CT head.  CT CERVICAL SPINE: Straightened cervical lordosis without acute fracture or or malalignment.   Electronically Signed By: Awilda Metroourtnay Bloomer M.D. On: 04/03/2015 22:54         Spoke with patient at length regarding the need for follow-up and possibly postconcussive syndrome.  Darci Currentandolph N Schon Zeiders, MD 04/04/15 [phone]

## 2015-05-29 ENCOUNTER — Emergency Department: Payer: Medicaid Other

## 2015-05-29 ENCOUNTER — Encounter: Payer: Self-pay | Admitting: *Deleted

## 2015-05-29 ENCOUNTER — Emergency Department
Admission: EM | Admit: 2015-05-29 | Discharge: 2015-05-29 | Disposition: A | Discharge status: Home / Self Care | Payer: Medicaid Other | Attending: Emergency Medicine | Admitting: Emergency Medicine

## 2015-05-29 DIAGNOSIS — M545 Low back pain, unspecified: Secondary | ICD-10-CM

## 2015-05-29 DIAGNOSIS — Z3202 Encounter for pregnancy test, result negative: Secondary | ICD-10-CM | POA: Insufficient documentation

## 2015-05-29 DIAGNOSIS — Z79899 Other long term (current) drug therapy: Secondary | ICD-10-CM | POA: Insufficient documentation

## 2015-05-29 DIAGNOSIS — Z880 Allergy status to penicillin: Secondary | ICD-10-CM | POA: Diagnosis not present

## 2015-05-29 LAB — URINALYSIS COMPLETE WITH MICROSCOPIC (ARMC ONLY)
BILIRUBIN URINE: NEGATIVE
GLUCOSE, UA: NEGATIVE mg/dL
HGB URINE DIPSTICK: NEGATIVE
KETONES UR: NEGATIVE mg/dL
LEUKOCYTES UA: NEGATIVE
Nitrite: NEGATIVE
Protein, ur: NEGATIVE mg/dL
SPECIFIC GRAVITY, URINE: 1.02 (ref 1.005–1.030)
pH: 7 (ref 5.0–8.0)

## 2015-05-29 LAB — POCT PREGNANCY, URINE: Preg Test, Ur: NEGATIVE

## 2015-05-29 MED ORDER — NAPROXEN 500 MG PO TABS: 500.0000 mg | ORAL_TABLET | Freq: Two times a day (BID) | ORAL | Status: DC

## 2015-05-29 MED ORDER — HYDROCODONE-ACETAMINOPHEN 5-325 MG PO TABS: 1.0000 | ORAL_TABLET | ORAL | Status: DC | PRN

## 2015-05-29 MED ORDER — HYDROCODONE-ACETAMINOPHEN 5-325 MG PO TABS
1.0000 | ORAL_TABLET | Freq: Once | ORAL | Status: AC
  Administered 2015-05-29: 1 via ORAL
  Filled 2015-05-29: qty 1

## 2015-05-29 MED ORDER — CYCLOBENZAPRINE HCL 5 MG PO TABS: 5.0000 mg | ORAL_TABLET | Freq: Three times a day (TID) | ORAL | Status: DC | PRN

## 2015-05-29 MED ORDER — KETOROLAC TROMETHAMINE 60 MG/2ML IM SOLN
60.0000 mg | Freq: Once | INTRAMUSCULAR | Status: AC
  Administered 2015-05-29: 60 mg via INTRAMUSCULAR
  Filled 2015-05-29: qty 2

## 2015-05-29 MED ORDER — DIAZEPAM 5 MG PO TABS
5.0000 mg | ORAL_TABLET | Freq: Once | ORAL | Status: AC
  Administered 2015-05-29: 5 mg via ORAL
  Filled 2015-05-29: qty 1

## 2015-05-29 NOTE — ED Provider Notes (Signed)
[facility] Emergency Department Provider Note  ____________________________________________  Time seen: Approximately 7:02 PM  I have reviewed the triage vital signs and the nursing notes.   HISTORY  Chief Complaint Back Pain    HPI Erica Walton is a 20 y.o. female is HIV positive complaining of low back pain continuous for several months. Patient states that she does not have a doctor and unable to speak. Patient is able to see her infectious disease doctor at [facility] with good results. Denies any recent trauma. Denies any numbness tingling saddle paresthesia or radiation of pain.   Past Medical History  Diagnosis Date  . HIV (human immunodeficiency virus infection) (HCC)     There are no active problems to display for this patient.   History reviewed. No pertinent past surgical history.  Current Outpatient Rx  Name  Route  Sig  Dispense  Refill  . cyclobenzaprine (FLEXERIL) 5 MG tablet   Oral   Take 1 tablet (5 mg total) by mouth every 8 (eight) hours as needed for muscle spasms.   30 tablet   0   . darunavir (PREZISTA) 400 MG tablet   Oral   Take 400 mg by mouth.         Marland Kitchen emtricitabine-tenofovir (TRUVADA) 200-300 MG per tablet   Oral   Take 1 tablet by mouth daily.         Marland Kitchen HYDROcodone-acetaminophen (NORCO) 5-325 MG tablet   Oral   Take 1-2 tablets by mouth every 4 (four) hours as needed for moderate pain.   15 tablet   0   . naproxen (NAPROSYN) 500 MG tablet   Oral   Take 1 tablet (500 mg total) by mouth 2 (two) times daily with a meal.   60 tablet   0     Allergies Penicillins  History reviewed. No pertinent family history.  Social History Social History  Substance Use Topics  . Smoking status: Never Smoker   . Smokeless tobacco: None  . Alcohol Use: No    Review of Systems Cardiovascular: Denies chest pain. Respiratory: Denies shortness of breath. Genitourinary: Negative for  dysuria. Musculoskeletal: Positive for back pain. Neurological: Negative for headaches, focal weakness or numbness.  10-point ROS otherwise negative.  ____________________________________________   PHYSICAL EXAM:  VITAL SIGNS: ED Triage Vitals  Enc Vitals Group     BP 05/29/15 1855 107/66 mmHg     Pulse Rate 05/29/15 1855 90     Resp 05/29/15 1855 18     Temp 05/29/15 1855 97.6 F (36.4 C)     Temp Source 05/29/15 1855 Oral     SpO2 05/29/15 1855 99 %     Weight 05/29/15 1855 95 lb (43.092 kg)     Height 05/29/15 1855  (1.448 m)     Head Cir --      Peak Flow --      Pain Score 05/29/15 1855 10     Pain Loc --      Pain Edu? --      Excl. in GC? --     Constitutional: Alert and oriented. Well appearing and in no acute distress. Cardiovascular: Normal rate, regular rhythm. Grossly normal heart sounds.  Good peripheral circulation. Respiratory: Normal respiratory effort.  No retractions. Lungs CTAB. Gastrointestinal: Soft and nontender. No distention. No abdominal bruits. No CVA tenderness. Musculoskeletal: Point tenderness to both lumbar and paralumbar spinal area. Straight leg raise positive. At approximately 45. Neurologic:  Normal speech and  language. No gross focal neurologic deficits are appreciated. No gait instability. Skin:  Skin is warm, dry and intact. No rash noted. Psychiatric: Mood and affect are normal. Speech and behavior are normal.  ____________________________________________   LABS (all labs ordered are listed, but only abnormal results are displayed)  Labs Reviewed  URINALYSIS COMPLETEWITH MICROSCOPIC ([facility] ONLY) - Abnormal; Notable for the following:    Color, Urine YELLOW (*)    APPearance CLOUDY (*)    Bacteria, UA RARE (*)    Squamous Epithelial / LPF 6-30 (*)    All other components within normal limits  POC URINE PREG, ED  POCT PREGNANCY, URINE    RADIOLOGY  Negative for any acute osseous findings of the lumbar  spine. ____________________________________________   PROCEDURES  Procedure(s) performed: None  Critical Care performed: No  ____________________________________________   INITIAL IMPRESSION / ASSESSMENT AND PLAN / ED COURSE  Pertinent labs & imaging results that were available during my care of the patient were reviewed by me and considered in my medical decision making (see chart for details).  Recurrent lower back pain without sciatica. Rx given for Naprosyn 500 mg twice a day, Flexeril 5 mg 3 times a day, hydrocodone 5/325 as needed for severe pain. Patient voices no other emergency medical complaints and will follow-up with orthopedics when she obtains insurance. ____________________________________________   FINAL CLINICAL IMPRESSION(S) / ED DIAGNOSES  Final diagnoses:  Acute low back pain without sciatica, unspecified back pain laterality     This chart was dictated using voice recognition software/Dragon. Despite best efforts to proofread, errors can occur which can change the meaning. Any change was purely unintentional.   Evangeline Dakin, PA-C 05/29/15 2001  Phineas Semen, MD 05/29/15 2037

## 2015-05-29 NOTE — ED Notes (Signed)
Patient transported to X-ray 

## 2015-05-29 NOTE — ED Notes (Signed)
Pt states lower back pain from a hx of a gymnastics inury

## 2015-05-29 NOTE — Discharge Instructions (Signed)
Back Pain, Adult °Back pain is very common in adults. The cause of back pain is rarely dangerous and the pain often gets better over time. The cause of your back pain may not be known. Some common causes of back pain include: °· Strain of the muscles or ligaments supporting the spine. °· Wear and tear (degeneration) of the spinal disks. °· Arthritis. °· Direct injury to the back. °For many people, back pain may return. Since back pain is rarely dangerous, most people can learn to manage this condition on their own. °HOME CARE INSTRUCTIONS °Watch your back pain for any changes. The following actions may help to lessen any discomfort you are feeling: °· Remain active. It is stressful on your back to sit or stand in one place for long periods of time. Do not sit, drive, or stand in one place for more than 30 minutes at a time. Take short walks on even surfaces as soon as you are able. Try to increase the length of time you walk each day. °· Exercise regularly as directed by your health care provider. Exercise helps your back heal faster. It also helps avoid future injury by keeping your muscles strong and flexible. °· Do not stay in bed. Resting more than 1-2 days can delay your recovery. °· Pay attention to your body when you bend and lift. The most comfortable positions are those that put less stress on your recovering back. Always use proper lifting techniques, including: °· Bending your knees. °· Keeping the load close to your body. °· Avoiding twisting. °· Find a comfortable position to sleep. Use a firm mattress and lie on your side with your knees slightly bent. If you lie on your back, put a pillow under your knees. °· Avoid feeling anxious or stressed. Stress increases muscle tension and can worsen back pain. It is important to recognize when you are anxious or stressed and learn ways to manage it, such as with exercise. °· Take medicines only as directed by your health care provider. Over-the-counter  medicines to reduce pain and inflammation are often the most helpful. Your health care provider may prescribe muscle relaxant drugs. These medicines help dull your pain so you can more quickly return to your normal activities and healthy exercise. °· Apply ice to the injured area: °· Put ice in a plastic bag. °· Place a towel between your skin and the bag. °· Leave the ice on for 20 minutes, 2-3 times a day for the first 2-3 days. After that, ice and heat may be alternated to reduce pain and spasms. °· Maintain a healthy weight. Excess weight puts extra stress on your back and makes it difficult to maintain good posture. °SEEK MEDICAL CARE IF: °· You have pain that is not relieved with rest or medicine. °· You have increasing pain going down into the legs or buttocks. °· You have pain that does not improve in one week. °· You have night pain. °· You lose weight. °· You have a fever or chills. °SEEK IMMEDIATE MEDICAL CARE IF:  °· You develop new bowel or bladder control problems. °· You have unusual weakness or numbness in your arms or legs. °· You develop nausea or vomiting. °· You develop abdominal pain. °· You feel faint. °  °This information is not intended to replace advice given to you by your health care provider. Make sure you discuss any questions you have with your health care provider. °  °Document Released: 04/06/2005 Document Revised: 04/27/2014 Document Reviewed: 08/08/2013 °Elsevier Interactive Patient Education ©2016 [employer]vier   [employer] ° °Chronic Back Pain ° When back pain lasts longer than 3 months, it is called chronic back pain. People with chronic back pain often go through certain periods that are more intense (flare-ups).  °CAUSES °Chronic back pain can be caused by wear and tear (degeneration) on different structures in your back. These structures include: °· The bones of your spine (vertebrae) and the joints surrounding your spinal cord and nerve roots (facets). °· The strong, fibrous tissues that  connect your vertebrae (ligaments). °Degeneration of these structures may result in pressure on your nerves. This can lead to constant pain. °HOME CARE INSTRUCTIONS °· Avoid bending, heavy lifting, prolonged sitting, and activities which make the problem worse. °· Take brief periods of rest throughout the day to reduce your pain. Lying down or standing usually is better than sitting while you are resting. °· Take over-the-counter or prescription medicines only as directed by your caregiver. °SEEK IMMEDIATE MEDICAL CARE IF:  °· You have weakness or numbness in one of your legs or feet. °· You have trouble controlling your bladder or bowels. °· You have nausea, vomiting, abdominal pain, shortness of breath, or fainting. °  °This information is not intended to replace advice given to you by your health care provider. Make sure you discuss any questions you have with your health care provider. °  °Document Released: 05/14/2004 Document Revised: 06/29/2011 Document Reviewed: 09/24/2014 °Elsevier Interactive Patient Education ©2016 [employer] Inc. ° °

## 2015-08-28 ENCOUNTER — Emergency Department
Admission: EM | Admit: 2015-08-28 | Discharge: 2015-08-28 | Disposition: A | Discharge status: Home / Self Care | Payer: Medicaid Other | Attending: Emergency Medicine | Admitting: Emergency Medicine

## 2015-08-28 ENCOUNTER — Encounter: Payer: Self-pay | Admitting: Emergency Medicine

## 2015-08-28 DIAGNOSIS — Z21 Asymptomatic human immunodeficiency virus [HIV] infection status: Secondary | ICD-10-CM | POA: Insufficient documentation

## 2015-08-28 DIAGNOSIS — M545 Low back pain: Secondary | ICD-10-CM | POA: Diagnosis not present

## 2015-08-28 DIAGNOSIS — M7918 Myalgia, other site: Secondary | ICD-10-CM

## 2015-08-28 MED ORDER — BACLOFEN 10 MG PO TABS: 10.0000 mg | ORAL_TABLET | Freq: Three times a day (TID) | ORAL | Status: DC

## 2015-08-28 MED ORDER — KETOROLAC TROMETHAMINE 60 MG/2ML IM SOLN: 60.0000 mg | Freq: Once | INTRAMUSCULAR | Status: DC

## 2015-08-28 MED ORDER — NAPROXEN 500 MG PO TABS: 500.0000 mg | ORAL_TABLET | Freq: Two times a day (BID) | ORAL | Status: DC

## 2015-08-28 MED ORDER — HYDROCODONE-ACETAMINOPHEN 5-325 MG PO TABS: 1.0000 | ORAL_TABLET | ORAL | Status: DC | PRN

## 2015-08-28 NOTE — Discharge Instructions (Signed)
Back Pain, Adult °Back pain is very common in adults. The cause of back pain is rarely dangerous and the pain often gets better over time. The cause of your back pain may not be known. Some common causes of back pain include: °· Strain of the muscles or ligaments supporting the spine. °· Wear and tear (degeneration) of the spinal disks. °· Arthritis. °· Direct injury to the back. °For many people, back pain may return. Since back pain is rarely dangerous, most people can learn to manage this condition on their own. °HOME CARE INSTRUCTIONS °Watch your back pain for any changes. The following actions may help to lessen any discomfort you are feeling: °· Remain active. It is stressful on your back to sit or stand in one place for long periods of time. Do not sit, drive, or stand in one place for more than 30 minutes at a time. Take short walks on even surfaces as soon as you are able. Try to increase the length of time you walk each day. °· Exercise regularly as directed by your health care provider. Exercise helps your back heal faster. It also helps avoid future injury by keeping your muscles strong and flexible. °· Do not stay in bed. Resting more than 1-2 days can delay your recovery. °· Pay attention to your body when you bend and lift. The most comfortable positions are those that put less stress on your recovering back. Always use proper lifting techniques, including: °· Bending your knees. °· Keeping the load close to your body. °· Avoiding twisting. °· Find a comfortable position to sleep. Use a firm mattress and lie on your side with your knees slightly bent. If you lie on your back, put a pillow under your knees. °· Avoid feeling anxious or stressed. Stress increases muscle tension and can worsen back pain. It is important to recognize when you are anxious or stressed and learn ways to manage it, such as with exercise. °· Take medicines only as directed by your health care provider. Over-the-counter  medicines to reduce pain and inflammation are often the most helpful. Your health care provider may prescribe muscle relaxant drugs. These medicines help dull your pain so you can more quickly return to your normal activities and healthy exercise. °· Apply ice to the injured area: °· Put ice in a plastic bag. °· Place a towel between your skin and the bag. °· Leave the ice on for 20 minutes, 2-3 times a day for the first 2-3 days. After that, ice and heat may be alternated to reduce pain and spasms. °· Maintain a healthy weight. Excess weight puts extra stress on your back and makes it difficult to maintain good posture. °SEEK MEDICAL CARE IF: °· You have pain that is not relieved with rest or medicine. °· You have increasing pain going down into the legs or buttocks. °· You have pain that does not improve in one week. °· You have night pain. °· You lose weight. °· You have a fever or chills. °SEEK IMMEDIATE MEDICAL CARE IF:  °· You develop new bowel or bladder control problems. °· You have unusual weakness or numbness in your arms or legs. °· You develop nausea or vomiting. °· You develop abdominal pain. °· You feel faint. °  °This information is not intended to replace advice given to you by your health care provider. Make sure you discuss any questions you have with your health care provider. °  °Document Released: 04/06/2005 Document Revised: 04/27/2014 Document Reviewed: 08/08/2013 °Elsevier Interactive Patient Education ©2016 Elsevier   Inc.  Musculoskeletal Pain Musculoskeletal pain is muscle and boney aches and pains. These pains can occur in any part of the body. Your caregiver may treat you without knowing the cause of the pain. They may treat you if blood or urine tests, X-rays, and other tests were normal.  CAUSES There is often not a definite cause or reason for these pains. These pains may be caused by a type of germ (virus). The discomfort may also come from overuse. Overuse includes working out  too hard when your body is not fit. Boney aches also come from weather changes. Bone is sensitive to atmospheric pressure changes. HOME CARE INSTRUCTIONS   Ask when your test results will be ready. Make sure you get your test results.  Only take over-the-counter or prescription medicines for pain, discomfort, or fever as directed by your caregiver. If you were given medications for your condition, do not drive, operate machinery or power tools, or sign legal documents for 24 hours. Do not drink alcohol. Do not take sleeping pills or other medications that may interfere with treatment.  Continue all activities unless the activities cause more pain. When the pain lessens, slowly resume normal activities. Gradually increase the intensity and duration of the activities or exercise.  During periods of severe pain, bed rest may be helpful. Lay or sit in any position that is comfortable.  Putting ice on the injured area.  Put ice in a bag.  Place a towel between your skin and the bag.  Leave the ice on for 15 to 20 minutes, 3 to 4 times a day.  Follow up with your caregiver for continued problems and no reason can be found for the pain. If the pain becomes worse or does not go away, it may be necessary to repeat tests or do additional testing. Your caregiver may need to look further for a possible cause. SEEK IMMEDIATE MEDICAL CARE IF:  You have pain that is getting worse and is not relieved by medications.  You develop chest pain that is associated with shortness or breath, sweating, feeling sick to your stomach (nauseous), or throw up (vomit).  Your pain becomes localized to the abdomen.  You develop any new symptoms that seem different or that concern you. MAKE SURE YOU:   Understand these instructions.  Will watch your condition.  Will get help right away if you are not doing well or get worse.   This information is not intended to replace advice given to you by your health care  provider. Make sure you discuss any questions you have with your health care provider.   Document Released: 04/06/2005 Document Revised: 06/29/2011 Document Reviewed: 12/09/2012 Elsevier Interactive Patient Education 2016 Elsevier Inc.  [employer] Therapy Heat therapy can help ease sore, stiff, injured, and tight muscles and joints. Heat relaxes your muscles, which may help ease your pain. Heat therapy should only be used on old, pre-existing, or long-lasting (chronic) injuries. Do not use heat therapy unless told by your doctor. HOW TO USE HEAT THERAPY There are several different kinds of heat therapy, including:  Moist heat pack.  Warm water bath.  Hot water bottle.  Electric heating pad.  Heated gel pack.  Heated wrap.  Electric heating pad. GENERAL HEAT THERAPY RECOMMENDATIONS   Do not sleep while using heat therapy. Only use heat therapy while you are awake.  Your skin may turn pink while using heat therapy. Do not use heat therapy if your skin turns red.  Do not use  heat therapy if you have new pain.  High heat or long exposure to heat can cause burns. Be careful when using heat therapy to avoid burning your skin.  Do not use heat therapy on areas of your skin that are already irritated, such as with a rash or sunburn. GET HELP IF:   You have blisters, redness, swelling (puffiness), or numbness.  You have new pain.  Your pain is worse. MAKE SURE YOU:  Understand these instructions.  Will watch your condition.  Will get help right away if you are not doing well or get worse.   This information is not intended to replace advice given to you by your health care provider. Make sure you discuss any questions you have with your health care provider.   Document Released: 06/29/2011 Document Revised: 04/27/2014 Document Reviewed: 05/30/2013 Elsevier Interactive Patient Education [employer]2016 Elsevier Inc.

## 2015-08-28 NOTE — ED Notes (Signed)
Pt reports bilateral hip and lower back pain/pressure x4 days; denies any urinary symptoms.

## 2015-08-28 NOTE — ED Provider Notes (Signed)
[facility] Emergency Department Provider Note  ____________________________________________  Time seen: Approximately 4:41 PM  I have reviewed the triage vital signs and the nursing notes.   HISTORY  Chief Complaint Back Pain    HPI Erica Walton is a 20 y.o. female presents for evaluation of continued lower back and hip pain and pressure 4 days. Denies any urinary symptoms. Past medical history is significant for HIV. Patient states that she did not get the medications filled from 2 months ago when she was here last secondary to lack of insurance or money. Describes her pain as 12/10 nonradiating.   Past Medical History  Diagnosis Date  . HIV (human immunodeficiency virus infection) (HCC)     There are no active problems to display for this patient.   History reviewed. No pertinent past surgical history.  Current Outpatient Rx  Name  Route  Sig  Dispense  Refill  . baclofen (LIORESAL) 10 MG tablet   Oral   Take 1 tablet (10 mg total) by mouth 3 (three) times daily.   30 tablet   0   . darunavir (PREZISTA) 400 MG tablet   Oral   Take 400 mg by mouth.         Marland Kitchen. emtricitabine-tenofovir (TRUVADA) 200-300 MG per tablet   Oral   Take 1 tablet by mouth daily.         Marland Kitchen. HYDROcodone-acetaminophen (NORCO) 5-325 MG tablet   Oral   Take 1-2 tablets by mouth every 4 (four) hours as needed for moderate pain.   8 tablet   0   . naproxen (NAPROSYN) 500 MG tablet   Oral   Take 1 tablet (500 mg total) by mouth 2 (two) times daily with a meal.   60 tablet   0     Allergies Penicillins  No family history on file.  Social History Social History  Substance Use Topics  . Smoking status: Never Smoker   . Smokeless tobacco: None  . Alcohol Use: No    Review of Systems Constitutional: No fever/chills Eyes: No visual changes. ENT: No sore throat. Cardiovascular: Denies chest pain. Respiratory: Denies shortness of  breath. Gastrointestinal: No abdominal pain.  No nausea, no vomiting.  No diarrhea.  No constipation. Genitourinary: Negative for dysuria. Musculoskeletal: Positive for low back pain. Skin: Negative for rash. Neurological: Negative for headaches, focal weakness or numbness.  10-point ROS otherwise negative.  ____________________________________________   PHYSICAL EXAM:  VITAL SIGNS: ED Triage Vitals  Enc Vitals Group     BP 08/28/15 1628 103/56 mmHg     Pulse Rate 08/28/15 1628 92     Resp 08/28/15 1628 20     Temp 08/28/15 1628 98.6 F (37 C)     Temp Source 08/28/15 1628 Oral     SpO2 08/28/15 1628 99 %     Weight 08/28/15 1628 105 lb (47.628 kg)     Height 08/28/15 1628 4\' 9"  (1.448 m)     Head Cir --      Peak Flow --      Pain Score --      Pain Loc --      Pain Edu? --      Excl. in GC? --     Constitutional: Alert and oriented. Well appearing and in no acute distress.  Cardiovascular: Normal rate, regular rhythm. Grossly normal heart sounds.  Good peripheral circulation. Respiratory: Normal respiratory effort.  No retractions. Lungs CTAB. Gastrointestinal: Soft and nontender. No distention.  No abdominal bruits. No CVA tenderness. Musculoskeletal: No lower extremity tenderness nor edema.  No joint effusions.Straight leg raise positive bilaterally at 20 both sides. When tenderness noted to the lumbar and paraspinal muscle area. Neurologic:  Normal speech and language. No gross focal neurologic deficits are appreciated. No gait instability. Skin:  Skin is warm, dry and intact. No rash noted. Psychiatric: Mood and affect are normal. Speech and behavior are normal.  ____________________________________________   LABS (all labs ordered are listed, but only abnormal results are displayed)  Labs Reviewed - No data to display ____________________________________________    PROCEDURES  Procedure(s) performed: None  Critical Care performed:  No  ____________________________________________   INITIAL IMPRESSION / ASSESSMENT AND PLAN / ED COURSE  Pertinent labs & imaging results that were available during my care of the patient were reviewed by me and considered in my medical decision making (see chart for details).  Acute lumbar muscle pain. Rx given for 1010 mg 3 times a day and Naprosyn 500 mg twice a day. Patient follow-up PCP or return to the ER for any worsening symptomology. ____________________________________________   FINAL CLINICAL IMPRESSION(S) / ED DIAGNOSES  Final diagnoses:  Lumbar muscle pain     This chart was dictated using voice recognition software/Dragon. Despite best efforts to proofread, errors can occur which can change the meaning. Any change was purely unintentional.   Evangeline Dakin, PA-C 08/28/15 1818  Maurilio Lovely, MD 08/29/15 1610

## 2015-11-29 ENCOUNTER — Ambulatory Visit: Payer: Medicaid Other | Admitting: Sports Medicine

## 2016-01-12 ENCOUNTER — Emergency Department
Admission: EM | Admit: 2016-01-12 | Discharge: 2016-01-12 | Disposition: A | Discharge status: Home / Self Care | Payer: Self-pay | Attending: Emergency Medicine | Admitting: Emergency Medicine

## 2016-01-12 ENCOUNTER — Encounter: Payer: Self-pay | Admitting: *Deleted

## 2016-01-12 ENCOUNTER — Emergency Department: Payer: Self-pay

## 2016-01-12 DIAGNOSIS — X501XXA Overexertion from prolonged static or awkward postures, initial encounter: Secondary | ICD-10-CM | POA: Insufficient documentation

## 2016-01-12 DIAGNOSIS — S93402A Sprain of unspecified ligament of left ankle, initial encounter: Secondary | ICD-10-CM | POA: Insufficient documentation

## 2016-01-12 DIAGNOSIS — Y9389 Activity, other specified: Secondary | ICD-10-CM | POA: Insufficient documentation

## 2016-01-12 DIAGNOSIS — H109 Unspecified conjunctivitis: Secondary | ICD-10-CM | POA: Insufficient documentation

## 2016-01-12 DIAGNOSIS — Z79899 Other long term (current) drug therapy: Secondary | ICD-10-CM | POA: Insufficient documentation

## 2016-01-12 DIAGNOSIS — Y990 Civilian activity done for income or pay: Secondary | ICD-10-CM | POA: Insufficient documentation

## 2016-01-12 DIAGNOSIS — Z791 Long term (current) use of non-steroidal anti-inflammatories (NSAID): Secondary | ICD-10-CM | POA: Insufficient documentation

## 2016-01-12 DIAGNOSIS — Z21 Asymptomatic human immunodeficiency virus [HIV] infection status: Secondary | ICD-10-CM | POA: Insufficient documentation

## 2016-01-12 DIAGNOSIS — Y929 Unspecified place or not applicable: Secondary | ICD-10-CM | POA: Insufficient documentation

## 2016-01-12 MED ORDER — MELOXICAM 15 MG PO TABS: 15.0000 mg | ORAL_TABLET | Freq: Every day | ORAL | 0 refills | Status: DC

## 2016-01-12 MED ORDER — POLYMYXIN B-TRIMETHOPRIM 10000-0.1 UNIT/ML-% OP SOLN: 2.0000 [drp] | Freq: Four times a day (QID) | OPHTHALMIC | 0 refills | Status: DC

## 2016-01-12 NOTE — ED Provider Notes (Signed)
[facility] Emergency Department Provider Note  ____________________________________________  Time seen: Approximately 6:59 PM  I have reviewed the triage vital signs and the nursing notes.   HISTORY  Chief Complaint Ankle Pain and Eye Pain    HPI Erica Walton is a 20 y.o. female who presents emergency department complaining of left ankle pain and left eye pain. Patient states that her left eye has been swelling, with pustular drainage and redness 2 weeks. Patient states that she "doesn't like seen the doctors" so has not sought medical treatment for this condition. Patient states that she has been using over-the-counter red eye drops with no relief. Patient denies any vision changes, headache. Patient is also complaining of left ankle pain. She states that she was at work when she "rolled her ankle." Patient states that she was sitting on the floor trying to restock shelf when she stood up and her ankle rolled. She states that pain is concerning to the lateral and anterior aspect of the ankle. No numbness or tingling in her toes. No gross edema or ecchymosis visualized. This occurred 3 days prior to arrival. No other complaints.   Past Medical History:  Diagnosis Date  . HIV (human immunodeficiency virus infection) (HCC)     There are no active problems to display for this patient.   History reviewed. No pertinent surgical history.  Prior to Admission medications   Medication Sig Start Date End Date Taking? Authorizing Provider  baclofen (LIORESAL) 10 MG tablet Take 1 tablet (10 mg total) by mouth 3 (three) times daily. 08/28/15   Charmayne Sheer Beers, PA-C  darunavir (PREZISTA) 400 MG tablet Take 400 mg by mouth.    Historical Provider, MD  emtricitabine-tenofovir (TRUVADA) 200-300 MG per tablet Take 1 tablet by mouth daily.    Historical Provider, MD  HYDROcodone-acetaminophen (NORCO) 5-325 MG tablet Take 1-2 tablets by mouth every 4 (four) hours as  needed for moderate pain. 08/28/15   Charmayne Sheer Beers, PA-C  meloxicam (MOBIC) 15 MG tablet Take 1 tablet (15 mg total) by mouth daily. 01/12/16   Delorise Royals Cuthriell, PA-C  naproxen (NAPROSYN) 500 MG tablet Take 1 tablet (500 mg total) by mouth 2 (two) times daily with a meal. 08/28/15   Evangeline Dakin, PA-C  trimethoprim-polymyxin b (POLYTRIM) ophthalmic solution Place 2 drops into the left eye every 6 (six) hours. 01/12/16   Delorise Royals Cuthriell, PA-C    Allergies Penicillins  History reviewed. No pertinent family history.  Social History Social History  Substance Use Topics  . Smoking status: Never Smoker  . Smokeless tobacco: Never Used  . Alcohol use No     Review of Systems  Constitutional: No fever/chills Eyes: No visual changes. Positive for purulent discharge from left eye. Left eye is red. ENT: No upper respiratory complaints. Cardiovascular: no chest pain. Respiratory: no cough. No SOB. Gastrointestinal: No abdominal pain.  No nausea, no vomiting.   Musculoskeletal: Positive for left ankle pain Skin: Negative for rash, abrasions, lacerations, ecchymosis. Neurological: Negative for headaches, focal weakness or numbness. 10-point ROS otherwise negative.  ____________________________________________   PHYSICAL EXAM:  VITAL SIGNS: ED Triage Vitals  Enc Vitals Group     BP 01/12/16 1828 98/67     Pulse Rate 01/12/16 1828 82     Resp 01/12/16 1828 16     Temp 01/12/16 1828 98.6 F (37 C)     Temp Source 01/12/16 1828 Oral     SpO2 01/12/16 1828 99 %  Weight 01/12/16 1828 105 lb (47.6 kg)     Height 01/12/16 1828 4\' 9"  (1.448 m)     Head Circumference --      Peak Flow --      Pain Score 01/12/16 1829 5     Pain Loc --      Pain Edu? --      Excl. in GC? --      Constitutional: Alert and oriented. Well appearing and in no acute distress. Eyes: Is erythematous on the left side. No drainage is appreciated this time. Funduscopic exam reveals good red  reflex, vascular and optic disc is unremarkable. PERRL. EOMI. Head: Atraumatic. ENT:      Ears:       Nose: No congestion/rhinnorhea.      Mouth/Throat: Mucous membranes are moist.  Neck: No stridor.   Cardiovascular: Normal rate, regular rhythm. Normal S1 and S2.  Good peripheral circulation. Respiratory: Normal respiratory effort without tachypnea or retractions. Lungs CTAB. Good air entry to the bases with no decreased or absent breath sounds. Musculoskeletal: Full range of motion to all extremities. No gross deformities appreciated. No deformities noted to left ankle upon inspection. Full range of motion ankle. Patient is diffusely tender to palpation over the lateral malleolus and talonavicular joint line. No palpable abnormality. Dorsalis pedis pulse intact. Sensation intact 5 digits. Neurologic:  Normal speech and language. No gross focal neurologic deficits are appreciated.  Skin:  Skin is warm, dry and intact. No rash noted. Psychiatric: Mood and affect are normal. Speech and behavior are normal. Patient exhibits appropriate insight and judgement.   ____________________________________________   LABS (all labs ordered are listed, but only abnormal results are displayed)  Labs Reviewed - No data to display ____________________________________________  EKG   ____________________________________________  RADIOLOGY Festus Barren Cuthriell, personally viewed and evaluated these images (plain radiographs) as part of my medical decision making, as well as reviewing the written report by the radiologist.  Dg Ankle Complete Left  Result Date: 01/12/2016 CLINICAL DATA:  Rolled left ankle at work, with left ankle pain. Initial encounter. EXAM: LEFT ANKLE COMPLETE - 3+ VIEW COMPARISON:  None. FINDINGS: There is no evidence of fracture or dislocation. The ankle mortise is intact; the interosseous space is within normal limits. No talar tilt or subluxation is seen. The joint spaces are  preserved. Dorsal soft tissue swelling is noted at the ankle. IMPRESSION: No evidence of fracture or dislocation. Electronically Signed   By: Roanna Raider M.D.   On: 01/12/2016 20:25    ____________________________________________    PROCEDURES  Procedure(s) performed:    Procedures    Medications - No data to display   ____________________________________________   INITIAL IMPRESSION / ASSESSMENT AND PLAN / ED COURSE  Pertinent labs & imaging results that were available during my care of the patient were reviewed by me and considered in my medical decision making (see chart for details).  Review of the [place] CSRS was performed in accordance of the [facility] prior to dispensing any controlled drugs.  Clinical Course    Patient's diagnosis is consistent with Left ankle sprain and left-sided pectoral conjunctivitis. Exam is reassuring to left ankle. X-ray reveals no acute osseous abnormality. Examination of the left eye is reassuring.. Patient will be discharged home with prescriptions for anti-inflammatories for left ankle and antibiotic eyedrops for left eye. Patient is to follow up with primary care as needed or otherwise directed. Patient is given ED precautions to return to the ED for any worsening  or new symptoms.     ____________________________________________  FINAL CLINICAL IMPRESSION(S) / ED DIAGNOSES  Final diagnoses:  Conjunctivitis of left eye  Left ankle sprain, initial encounter      NEW MEDICATIONS STARTED DURING THIS VISIT:  New Prescriptions   MELOXICAM (MOBIC) 15 MG TABLET    Take 1 tablet (15 mg total) by mouth daily.   TRIMETHOPRIM-POLYMYXIN B (POLYTRIM) OPHTHALMIC SOLUTION    Place 2 drops into the left eye every 6 (six) hours.        This chart was dictated using voice recognition software/Dragon. Despite best efforts to proofread, errors can occur which can change the meaning. Any change was purely unintentional.    Racheal PatchesJonathan D Cuthriell,  PA-C 01/12/16 2039    Phineas SemenGraydon Goodman, MD 01/12/16 2328

## 2016-01-12 NOTE — ED Triage Notes (Signed)
Pt reports rolling left ankle to the outside 3 days ago at work. Pt reports swelling and pain has increased over the past three days. Pt also has left eye redness to left eye for the past 2-3 weeks. Pt reports discharge at night that she reports is "cursted" when she wakes up. Pt denies fevers or vision changes.

## 2017-09-26 ENCOUNTER — Other Ambulatory Visit: Payer: Self-pay

## 2017-09-26 ENCOUNTER — Encounter: Payer: Self-pay | Admitting: Emergency Medicine

## 2017-09-26 ENCOUNTER — Emergency Department: Payer: Medicaid Other

## 2017-09-26 ENCOUNTER — Emergency Department
Admission: EM | Admit: 2017-09-26 | Discharge: 2017-09-26 | Disposition: A | Discharge status: Home / Self Care | Payer: Medicaid Other | Attending: Emergency Medicine | Admitting: Emergency Medicine

## 2017-09-26 DIAGNOSIS — J069 Acute upper respiratory infection, unspecified: Secondary | ICD-10-CM | POA: Diagnosis not present

## 2017-09-26 DIAGNOSIS — Z79899 Other long term (current) drug therapy: Secondary | ICD-10-CM | POA: Diagnosis not present

## 2017-09-26 DIAGNOSIS — Z21 Asymptomatic human immunodeficiency virus [HIV] infection status: Secondary | ICD-10-CM | POA: Insufficient documentation

## 2017-09-26 DIAGNOSIS — O99513 Diseases of the respiratory system complicating pregnancy, third trimester: Secondary | ICD-10-CM | POA: Insufficient documentation

## 2017-09-26 MED ORDER — SULFAMETHOXAZOLE-TRIMETHOPRIM 800-160 MG PO TABS: 1.0000 | ORAL_TABLET | Freq: Two times a day (BID) | ORAL | 0 refills | Status: DC

## 2017-09-26 MED ORDER — ALBUTEROL SULFATE (2.5 MG/3ML) 0.083% IN NEBU
2.5000 mg | INHALATION_SOLUTION | Freq: Once | RESPIRATORY_TRACT | Status: AC
Start: 2017-09-26 — End: 2017-09-26
  Administered 2017-09-26: 2.5 mg via RESPIRATORY_TRACT
  Filled 2017-09-26: qty 3

## 2017-09-26 MED ORDER — ALBUTEROL SULFATE HFA 108 (90 BASE) MCG/ACT IN AERS: 2.0000 | INHALATION_SPRAY | Freq: Four times a day (QID) | RESPIRATORY_TRACT | 0 refills | Status: DC | PRN

## 2017-09-26 NOTE — ED Notes (Signed)
Pt has hx of HIV. Pt is 29 weeks 2 days pregnant. Pt is G3L2. Pt c/o cough and swollen glands in her throat. Pt with noted strong productive cough, pt states white thick sputum when cough is productive. Pt also with noted nasal congestion.

## 2017-09-26 NOTE — ED Provider Notes (Signed)
[facility] Emergency Department Provider Note  ____________________________________________  Time seen: Approximately 1:43 PM  I have reviewed the triage vital signs and the nursing notes.   HISTORY  Chief Complaint Cough; Nasal Congestion; and Chest Pain    HPI Erica Walton is a 22 y.o. female that is 6629 weeks pregnant with PMH HIV that presents to the emergency department for evaluation of nasal congestion, sore throat, cough for 6 days. Cough is productive with white sputum. Patient has had chest tightness for several days that worsens with coughing and deep breathing. She is [redacted] weeks pregnant.  This is her third child.  Last CD4 count was in May and was 600.  No fever, chills, shortness of breath, nausea, vomiting, abdominal pain.   Past Medical History:  Diagnosis Date  . HIV (human immunodeficiency virus infection) (HCC)     There are no active problems to display for this patient.   History reviewed. No pertinent surgical history.  Prior to Admission medications   Medication Sig Start Date End Date Taking? Authorizing Provider  albuterol (PROVENTIL HFA;VENTOLIN HFA) 108 (90 Base) MCG/ACT inhaler Inhale 2 puffs into the lungs every 6 (six) hours as needed for wheezing or shortness of breath. 09/26/17   Enid DerryWagner, Feliz Herard, PA-C  baclofen (LIORESAL) 10 MG tablet Take 1 tablet (10 mg total) by mouth 3 (three) times daily. 08/28/15   Beers, Charmayne Sheerharles M, PA-C  darunavir (PREZISTA) 400 MG tablet Take 400 mg by mouth.    [provider]  emtricitabine-tenofovir (TRUVADA) 200-300 MG per tablet Take 1 tablet by mouth daily.    [provider]  HYDROcodone-acetaminophen (NORCO) 5-325 MG tablet Take 1-2 tablets by mouth every 4 (four) hours as needed for moderate pain. 08/28/15   Beers, Charmayne Sheerharles M, PA-C  meloxicam (MOBIC) 15 MG tablet Take 1 tablet (15 mg total) by mouth daily. 01/12/16   Cuthriell, Delorise RoyalsJonathan D, PA-C  naproxen (NAPROSYN) 500 MG  tablet Take 1 tablet (500 mg total) by mouth 2 (two) times daily with a meal. 08/28/15   Beers, Charmayne Sheerharles M, PA-C  sulfamethoxazole-trimethoprim (BACTRIM DS,SEPTRA DS) 800-160 MG tablet Take 1 tablet by mouth 2 (two) times daily. 09/26/17   Enid DerryWagner, Ambermarie Honeyman, PA-C  trimethoprim-polymyxin b (POLYTRIM) ophthalmic solution Place 2 drops into the left eye every 6 (six) hours. 01/12/16   Cuthriell, Delorise RoyalsJonathan D, PA-C    Allergies Penicillins  No family history on file.  Social History Social History   Tobacco Use  . Smoking status: Never Smoker  . Smokeless tobacco: Never Used  Substance Use Topics  . Alcohol use: No  . Drug use: Not on file     Review of Systems  Constitutional: No fever/chills Eyes: No visual changes. No discharge. ENT: Positive for congestion and rhinorrhea. Respiratory: Positive for cough. No SOB. Gastrointestinal: No abdominal pain.  No nausea, no vomiting. Musculoskeletal: Negative for musculoskeletal pain. Skin: Negative for rash, abrasions, lacerations, ecchymosis. Neurological: Negative for headaches.   ____________________________________________   PHYSICAL EXAM:  VITAL SIGNS: ED Triage Vitals [09/26/17 1217]  Enc Vitals Group     BP 111/69     Pulse Rate (!) 105     Resp 16     Temp 98.3 F (36.8 C)     Temp Source Oral     SpO2 100 %     Weight 128 lb (58.1 kg)     Height 4' 9.5" (1.461 m)     Head Circumference      Peak Flow  Pain Score 0     Pain Loc      Pain Edu?      Excl. in GC?      Constitutional: Alert and oriented. Well appearing and in no acute distress. Eyes: Conjunctivae are normal. PERRL. EOMI. No discharge. Head: Atraumatic. ENT: No frontal and maxillary sinus tenderness.      Ears: Tympanic membranes pearly gray with good landmarks. No discharge.      Nose: Moderate congestion/rhinnorhea.      Mouth/Throat: Mucous membranes are moist. Oropharynx non-erythematous. Tonsils not enlarged. No exudates. Uvula midline. Neck:  No stridor.   Hematological/Lymphatic/Immunilogical: No cervical lymphadenopathy. Cardiovascular: Normal rate, regular rhythm.  Good peripheral circulation. Respiratory: Normal respiratory effort without tachypnea or retractions. Lungs CTAB. Good air entry to the bases with no decreased or absent breath sounds. Gastrointestinal: Bowel sounds 4 quadrants. Soft and nontender to palpation. No guarding or rigidity. No palpable masses. No distention. Musculoskeletal: Full range of motion to all extremities. No gross deformities appreciated. Neurologic:  Normal speech and language. No gross focal neurologic deficits are appreciated.  Skin:  Skin is warm, dry and intact. No rash noted. Psychiatric: Mood and affect are normal. Speech and behavior are normal. Patient exhibits appropriate insight and judgement.   ____________________________________________   LABS (all labs ordered are listed, but only abnormal results are displayed)  Labs Reviewed - No data to display ____________________________________________  EKG  ST ____________________________________________  RADIOLOGY   No results found.  ____________________________________________    PROCEDURES  Procedure(s) performed:    Procedures    Medications  albuterol (PROVENTIL) (2.5 MG/3ML) 0.083% nebulizer solution 2.5 mg (2.5 mg Nebulization Given 09/26/17 1347)     ____________________________________________   INITIAL IMPRESSION / ASSESSMENT AND PLAN / ED COURSE  Pertinent labs & imaging results that were available during my care of the patient were reviewed by me and considered in my medical decision making (see chart for details).  Review of the [place] CSRS was performed in accordance of the [facility] prior to dispensing any controlled drugs.   Patient's diagnosis is consistent with URI. Vital signs and exam are reassuring.  No STEMI on EKG per MD.  EKG shows sinus tachycardia.  Dr. Jean Rosenthal with OB was consulted and is  agreeable with treatment plan of Bactrim to cover for PCP pneumonia.  Chest tightness resolved with albuterol nebulizer.  Patient appears well and is staying well hydrated. Patient feels comfortable going home. Patient will be discharged home with prescriptions for Bactrim and albuterol inhaler. Patient is to follow up with OB as needed or otherwise directed. Patient is given ED precautions to return to the ED for any worsening or new symptoms.     ____________________________________________  FINAL CLINICAL IMPRESSION(S) / ED DIAGNOSES  Final diagnoses:  Upper respiratory tract infection, unspecified type      NEW MEDICATIONS STARTED DURING THIS VISIT:  ED Discharge Orders        Ordered    sulfamethoxazole-trimethoprim (BACTRIM DS,SEPTRA DS) 800-160 MG tablet  2 times daily     09/26/17 1501    albuterol (PROVENTIL HFA;VENTOLIN HFA) 108 (90 Base) MCG/ACT inhaler  Every 6 hours PRN     09/26/17 1501          This chart was dictated using voice recognition software/Dragon. Despite best efforts to proofread, errors can occur which can change the meaning. Any change was purely unintentional.    Enid Derry, PA-C 09/26/17 1818    Phineas Semen, MD 09/27/17 [phone]

## 2017-09-26 NOTE — ED Triage Notes (Signed)
C/O cough, congestion x 6 days.  States symptoms worsened Friday, and last night felt chest pressure. Denies SOB.

## 2017-10-18 DIAGNOSIS — O98713 Human immunodeficiency virus [HIV] disease complicating pregnancy, third trimester: Secondary | ICD-10-CM | POA: Diagnosis not present

## 2017-10-18 DIAGNOSIS — Z3A32 32 weeks gestation of pregnancy: Secondary | ICD-10-CM | POA: Diagnosis not present

## 2017-11-01 DIAGNOSIS — B20 Human immunodeficiency virus [HIV] disease: Secondary | ICD-10-CM | POA: Diagnosis not present

## 2017-11-09 DIAGNOSIS — Z3A35 35 weeks gestation of pregnancy: Secondary | ICD-10-CM | POA: Diagnosis not present

## 2017-11-09 DIAGNOSIS — O98713 Human immunodeficiency virus [HIV] disease complicating pregnancy, third trimester: Secondary | ICD-10-CM | POA: Diagnosis not present

## 2017-11-18 DIAGNOSIS — O98713 Human immunodeficiency virus [HIV] disease complicating pregnancy, third trimester: Secondary | ICD-10-CM | POA: Diagnosis not present

## 2017-11-30 DIAGNOSIS — O98713 Human immunodeficiency virus [HIV] disease complicating pregnancy, third trimester: Secondary | ICD-10-CM | POA: Diagnosis not present

## 2017-11-30 DIAGNOSIS — Z3A00 Weeks of gestation of pregnancy not specified: Secondary | ICD-10-CM | POA: Diagnosis not present

## 2017-12-05 DIAGNOSIS — Z3A39 39 weeks gestation of pregnancy: Secondary | ICD-10-CM | POA: Diagnosis not present

## 2017-12-06 DIAGNOSIS — O9872 Human immunodeficiency virus [HIV] disease complicating childbirth: Secondary | ICD-10-CM | POA: Diagnosis not present

## 2017-12-06 DIAGNOSIS — Z3A39 39 weeks gestation of pregnancy: Secondary | ICD-10-CM | POA: Diagnosis not present

## 2018-02-03 ENCOUNTER — Encounter: Payer: Self-pay | Admitting: *Deleted

## 2018-02-03 ENCOUNTER — Other Ambulatory Visit: Payer: Self-pay

## 2018-02-03 ENCOUNTER — Emergency Department
Admission: EM | Admit: 2018-02-03 | Discharge: 2018-02-03 | Disposition: A | Discharge status: Home / Self Care | Payer: Medicaid Other | Attending: Emergency Medicine | Admitting: Emergency Medicine

## 2018-02-03 DIAGNOSIS — Y939 Activity, unspecified: Secondary | ICD-10-CM | POA: Insufficient documentation

## 2018-02-03 DIAGNOSIS — Y999 Unspecified external cause status: Secondary | ICD-10-CM | POA: Insufficient documentation

## 2018-02-03 DIAGNOSIS — Z79899 Other long term (current) drug therapy: Secondary | ICD-10-CM | POA: Diagnosis not present

## 2018-02-03 DIAGNOSIS — B20 Human immunodeficiency virus [HIV] disease: Secondary | ICD-10-CM | POA: Insufficient documentation

## 2018-02-03 DIAGNOSIS — S60445A External constriction of left ring finger, initial encounter: Secondary | ICD-10-CM | POA: Diagnosis not present

## 2018-02-03 DIAGNOSIS — Y929 Unspecified place or not applicable: Secondary | ICD-10-CM | POA: Insufficient documentation

## 2018-02-03 DIAGNOSIS — W4904XA Ring or other jewelry causing external constriction, initial encounter: Secondary | ICD-10-CM | POA: Diagnosis not present

## 2018-02-03 DIAGNOSIS — M79645 Pain in left finger(s): Secondary | ICD-10-CM | POA: Diagnosis not present

## 2018-02-03 NOTE — ED Notes (Signed)
Dr. Dolores Frame at the bedside for pt evaluation. Pt denies injury and states her fingers were starting to swell this evening due to normal fluid retention she suffers from monthly. Pt states when she attempted to take her ring off the finger became increasingly swollen. Denies injury. Finger purple and red in color with swelling noted. +painful with tingling per pt.

## 2018-02-03 NOTE — ED Triage Notes (Signed)
Pt said she tried to get the ring on her left hand fourth finger off, but she was unable. The more she tried, her finger started swelling more.

## 2018-02-03 NOTE — ED Provider Notes (Signed)
[facility] Emergency Department Provider Note   ____________________________________________   First MD Initiated Contact with Patient 02/03/18 [phone]     (approximate)  I have reviewed the triage vital signs and the nursing notes.   HISTORY  Chief Complaint Hand Pain    HPI Erica Walton is a 22 y.o. female who presents to the ED from home with a chief complaint of ring stuck on left fourth digit.  Patient started her period and states that her fingers always swell during her menstrual cycle.  Try to remove her ring but finger progressively swelled.  Complains of pain and swelling in her left fourth digit.  Denies numbness or tingling.  Voices no other complaints.   Past Medical History:  Diagnosis Date  . HIV (human immunodeficiency virus infection) (HCC)     There are no active problems to display for this patient.   History reviewed. No pertinent surgical history.  Prior to Admission medications   Medication Sig Start Date End Date Taking? Authorizing Provider  albuterol (PROVENTIL HFA;VENTOLIN HFA) 108 (90 Base) MCG/ACT inhaler Inhale 2 puffs into the lungs every 6 (six) hours as needed for wheezing or shortness of breath. 09/26/17   Enid Derry, PA-C  baclofen (LIORESAL) 10 MG tablet Take 1 tablet (10 mg total) by mouth 3 (three) times daily. 08/28/15   Beers, Charmayne Sheer, PA-C  darunavir (PREZISTA) 400 MG tablet Take 400 mg by mouth.    [provider]  emtricitabine-tenofovir (TRUVADA) 200-300 MG per tablet Take 1 tablet by mouth daily.    [provider]  HYDROcodone-acetaminophen (NORCO) 5-325 MG tablet Take 1-2 tablets by mouth every 4 (four) hours as needed for moderate pain. 08/28/15   Beers, Charmayne Sheer, PA-C  meloxicam (MOBIC) 15 MG tablet Take 1 tablet (15 mg total) by mouth daily. 01/12/16   Cuthriell, Delorise Royals, PA-C  naproxen (NAPROSYN) 500 MG tablet Take 1 tablet (500 mg total) by mouth 2 (two) times daily with a  meal. 08/28/15   Beers, Charmayne Sheer, PA-C  sulfamethoxazole-trimethoprim (BACTRIM DS,SEPTRA DS) 800-160 MG tablet Take 1 tablet by mouth 2 (two) times daily. 09/26/17   Enid Derry, PA-C  trimethoprim-polymyxin b (POLYTRIM) ophthalmic solution Place 2 drops into the left eye every 6 (six) hours. 01/12/16   Cuthriell, Delorise Royals, PA-C    Allergies Penicillins  No family history on file.  Social History Social History   Tobacco Use  . Smoking status: Never Smoker  . Smokeless tobacco: Never Used  Substance Use Topics  . Alcohol use: No  . Drug use: Not on file    Review of Systems  Constitutional: No fever/chills Eyes: No visual changes. ENT: No sore throat. Cardiovascular: Denies chest pain. Respiratory: Denies shortness of breath. Gastrointestinal: No abdominal pain.  No nausea, no vomiting.  No diarrhea.  No constipation. Genitourinary: Negative for dysuria. Musculoskeletal: Positive for ring stuck on the fourth digit.  Negative for back pain. Skin: Negative for rash. Neurological: Negative for headaches, focal weakness or numbness.   ____________________________________________   PHYSICAL EXAM:  VITAL SIGNS: ED Triage Vitals  Enc Vitals Group     BP 02/03/18 0543 114/78     Pulse Rate 02/03/18 0543 82     Resp 02/03/18 0543 16     Temp 02/03/18 0543 (!) 97.4 F (36.3 C)     Temp Source 02/03/18 0543 Oral     SpO2 02/03/18 0543 99 %     Weight --  Height --      Head Circumference --      Peak Flow --      Pain Score 02/03/18 0542 9     Pain Loc --      Pain Edu? --      Excl. in GC? --     Constitutional: Alert and oriented. Well appearing and in no acute distress. Eyes: Conjunctivae are normal. PERRL. EOMI. Head: Atraumatic. Nose: No congestion/rhinnorhea. Mouth/Throat: Mucous membranes are moist.  Oropharynx non-erythematous. Neck: No stridor.   Cardiovascular: Normal rate, regular rhythm. Grossly normal heart sounds.  Good peripheral  circulation. Respiratory: Normal respiratory effort.  No retractions. Lungs CTAB. Gastrointestinal: Soft and nontender. No distention. No abdominal bruits. No CVA tenderness. Musculoskeletal:  Ring stuck on left fourth finger.  Finger with mild to moderate swelling.  2+ radial pulse.  Brisk, less than 5-second capillary refill. No lower extremity tenderness nor edema.  No joint effusions. Neurologic:  Normal speech and language. No gross focal neurologic deficits are appreciated. No gait instability. Skin:  Skin is warm, dry and intact. No rash noted. Psychiatric: Mood and affect are normal. Speech and behavior are normal.  ____________________________________________   LABS (all labs ordered are listed, but only abnormal results are displayed)  Labs Reviewed - No data to display ____________________________________________  EKG  None ____________________________________________  RADIOLOGY  ED MD interpretation: None  Official radiology report(s): No results found.  ____________________________________________   PROCEDURES  Procedure(s) performed: None  Procedures  Critical Care performed: No  ____________________________________________   INITIAL IMPRESSION / ASSESSMENT AND PLAN / ED COURSE  As part of my medical decision making, I reviewed the following data within the electronic MEDICAL RECORD NUMBER Nursing notes reviewed and incorporated and Notes from prior ED visits   22 year old female with ring stuck on her left fourth finger.  Will remove with ring cutter.  Clinical Course as of Feb 04 628  Thu Feb 03, 2018  0628 Nursing remove ring with electric ring cutter.  Patient remains neurovascular intact.  Instructed patient to elevate her left hand as much as possible today.  Strict return precautions given.  Patient verbalizes understanding and agrees with plan of care.   [JS]    Clinical Course User Index [JS] Irean Hong, MD      ____________________________________________   FINAL CLINICAL IMPRESSION(S) / ED DIAGNOSES  Final diagnoses:  Ring or other jewelry causing external constriction, initial encounter     ED Discharge Orders    None       Note:  This document was prepared using Dragon voice recognition software and may include unintentional dictation errors.    Irean Hong, MD 02/03/18 [phone]

## 2018-02-03 NOTE — Discharge Instructions (Addendum)
Elevate left hand is much as possible today to reduce swelling.  Return to the ER for worsening symptoms, increased swelling, redness or other concerns.

## 2018-02-03 NOTE — ED Notes (Signed)
Successfully removed ring from affected finger with ring cutter. Pt tolerated well.

## 2018-06-15 DIAGNOSIS — O98712 Human immunodeficiency virus [HIV] disease complicating pregnancy, second trimester: Secondary | ICD-10-CM | POA: Diagnosis not present

## 2018-08-05 DIAGNOSIS — O98711 Human immunodeficiency virus [HIV] disease complicating pregnancy, first trimester: Secondary | ICD-10-CM | POA: Diagnosis not present

## 2018-09-15 DIAGNOSIS — Z21 Asymptomatic human immunodeficiency virus [HIV] infection status: Secondary | ICD-10-CM | POA: Diagnosis not present

## 2018-09-15 DIAGNOSIS — O98711 Human immunodeficiency virus [HIV] disease complicating pregnancy, first trimester: Secondary | ICD-10-CM | POA: Diagnosis not present

## 2018-09-15 DIAGNOSIS — O365930 Maternal care for other known or suspected poor fetal growth, third trimester, not applicable or unspecified: Secondary | ICD-10-CM | POA: Diagnosis not present

## 2018-09-15 DIAGNOSIS — Z3A32 32 weeks gestation of pregnancy: Secondary | ICD-10-CM | POA: Diagnosis not present

## 2018-10-07 DIAGNOSIS — B20 Human immunodeficiency virus [HIV] disease: Secondary | ICD-10-CM | POA: Diagnosis not present

## 2018-10-17 DIAGNOSIS — Z3493 Encounter for supervision of normal pregnancy, unspecified, third trimester: Secondary | ICD-10-CM | POA: Diagnosis not present

## 2018-10-26 DIAGNOSIS — Z3493 Encounter for supervision of normal pregnancy, unspecified, third trimester: Secondary | ICD-10-CM | POA: Diagnosis not present

## 2018-11-01 DIAGNOSIS — Z1159 Encounter for screening for other viral diseases: Secondary | ICD-10-CM | POA: Diagnosis not present

## 2018-11-04 DIAGNOSIS — O98313 Other infections with a predominantly sexual mode of transmission complicating pregnancy, third trimester: Secondary | ICD-10-CM | POA: Diagnosis not present

## 2018-11-04 DIAGNOSIS — Z599 Problem related to housing and economic circumstances, unspecified: Secondary | ICD-10-CM | POA: Diagnosis not present

## 2018-11-04 DIAGNOSIS — B009 Herpesviral infection, unspecified: Secondary | ICD-10-CM | POA: Diagnosis not present

## 2018-11-04 DIAGNOSIS — J45909 Unspecified asthma, uncomplicated: Secondary | ICD-10-CM | POA: Diagnosis not present

## 2018-11-04 DIAGNOSIS — O99513 Diseases of the respiratory system complicating pregnancy, third trimester: Secondary | ICD-10-CM | POA: Diagnosis not present

## 2018-11-04 DIAGNOSIS — O98713 Human immunodeficiency virus [HIV] disease complicating pregnancy, third trimester: Secondary | ICD-10-CM | POA: Diagnosis not present

## 2018-11-04 DIAGNOSIS — Z3A39 39 weeks gestation of pregnancy: Secondary | ICD-10-CM | POA: Diagnosis not present

## 2018-11-05 DIAGNOSIS — O9872 Human immunodeficiency virus [HIV] disease complicating childbirth: Secondary | ICD-10-CM | POA: Diagnosis not present

## 2018-11-05 DIAGNOSIS — Z3A39 39 weeks gestation of pregnancy: Secondary | ICD-10-CM | POA: Diagnosis not present

## 2018-11-06 DIAGNOSIS — Z599 Problem related to housing and economic circumstances, unspecified: Secondary | ICD-10-CM | POA: Diagnosis not present

## 2018-11-06 DIAGNOSIS — Z30017 Encounter for initial prescription of implantable subdermal contraceptive: Secondary | ICD-10-CM | POA: Diagnosis not present

## 2018-11-06 DIAGNOSIS — D649 Anemia, unspecified: Secondary | ICD-10-CM | POA: Diagnosis not present

## 2018-11-06 DIAGNOSIS — B20 Human immunodeficiency virus [HIV] disease: Secondary | ICD-10-CM | POA: Diagnosis not present

## 2019-02-03 DIAGNOSIS — M545 Low back pain: Secondary | ICD-10-CM | POA: Diagnosis not present

## 2019-02-03 DIAGNOSIS — M461 Sacroiliitis, not elsewhere classified: Secondary | ICD-10-CM | POA: Diagnosis not present

## 2019-04-02 DIAGNOSIS — Z20828 Contact with and (suspected) exposure to other viral communicable diseases: Secondary | ICD-10-CM | POA: Diagnosis not present

## 2019-04-02 DIAGNOSIS — G4489 Other headache syndrome: Secondary | ICD-10-CM | POA: Diagnosis not present

## 2019-04-02 DIAGNOSIS — R05 Cough: Secondary | ICD-10-CM | POA: Diagnosis not present

## 2019-04-04 ENCOUNTER — Encounter: Payer: Self-pay | Admitting: Family

## 2019-04-05 ENCOUNTER — Other Ambulatory Visit: Payer: Medicaid Other

## 2019-04-05 ENCOUNTER — Other Ambulatory Visit: Payer: Self-pay | Admitting: *Deleted

## 2019-04-05 DIAGNOSIS — Z79899 Other long term (current) drug therapy: Secondary | ICD-10-CM

## 2019-04-05 DIAGNOSIS — B20 Human immunodeficiency virus [HIV] disease: Secondary | ICD-10-CM

## 2019-04-05 DIAGNOSIS — Z113 Encounter for screening for infections with a predominantly sexual mode of transmission: Secondary | ICD-10-CM

## 2019-04-12 ENCOUNTER — Other Ambulatory Visit: Payer: Self-pay

## 2019-04-12 ENCOUNTER — Encounter: Payer: Self-pay | Admitting: Certified Nurse Midwife

## 2019-04-12 ENCOUNTER — Ambulatory Visit (INDEPENDENT_AMBULATORY_CARE_PROVIDER_SITE_OTHER): Payer: Medicaid Other | Admitting: Certified Nurse Midwife

## 2019-04-12 VITALS — BP 116/78 | HR 96 | Ht <= 58 in | Wt 152.0 lb

## 2019-04-12 DIAGNOSIS — Z01419 Encounter for gynecological examination (general) (routine) without abnormal findings: Secondary | ICD-10-CM

## 2019-04-12 DIAGNOSIS — Z3046 Encounter for surveillance of implantable subdermal contraceptive: Secondary | ICD-10-CM | POA: Diagnosis not present

## 2019-04-12 NOTE — Patient Instructions (Signed)

## 2019-04-12 NOTE — Progress Notes (Signed)
GYNECOLOGY ANNUAL PREVENTATIVE CARE ENCOUNTER NOTE  History:     Erica Walton is a 23 y.o. [phone] female here for a new patient gynecologic exam.  Current complaints: requesting nexplanon removal d/t irregular bleeding.   Denies discharge, pelvic pain, problems with intercourse or other gynecologic concerns.    She reports since placement of Nexplanon in July has consistently had a cycle with 2-3 days off since placement. Patient reports having Nexplanon during years of 2015-2018 with no abnormal bleeding. Does not want to be on birth control at this time.    Gynecologic History No LMP recorded (lmp unknown). Contraception: Nexplanon Last Pap: 03/2018. Results were: normal with negative HPV  Obstetric History OB History  Gravida Para Term Preterm AB Living  4 4 4     4   SAB TAB Ectopic Multiple Live Births               # Outcome Date GA Lbr Len/2nd Weight Sex Delivery Anes PTL Lv  4 Term           3 Term           2 Term           1 Term             Past Medical History:  Diagnosis Date  . Genital herpes   . HIV (human immunodeficiency virus infection) ([facility])     History reviewed. No pertinent surgical history.  Current Outpatient Medications on File Prior to Visit  Medication Sig Dispense Refill  . elvitegravir-cobicistat-emtricitabine-tenofovir (GENVOYA) 150-150-200-10 MG TABS tablet Take by mouth.    . etonogestrel (NEXPLANON) 68 MG IMPL implant 1 each by Subdermal route once.    Marland Kitchen albuterol (PROVENTIL HFA;VENTOLIN HFA) 108 (90 Base) MCG/ACT inhaler Inhale 2 puffs into the lungs every 6 (six) hours as needed for wheezing or shortness of breath. (Patient not taking: Reported on 04/12/2019) 1 Inhaler 0  . baclofen (LIORESAL) 10 MG tablet Take 1 tablet (10 mg total) by mouth 3 (three) times daily. (Patient not taking: Reported on 04/12/2019) 30 tablet 0  . darunavir (PREZISTA) 400 MG tablet Take 400 mg by mouth.    Marland Kitchen emtricitabine-tenofovir (TRUVADA) 200-300 MG  per tablet Take 1 tablet by mouth daily.    Marland Kitchen HYDROcodone-acetaminophen (NORCO) 5-325 MG tablet Take 1-2 tablets by mouth every 4 (four) hours as needed for moderate pain. (Patient not taking: Reported on 04/12/2019) 8 tablet 0  . meloxicam (MOBIC) 15 MG tablet Take 1 tablet (15 mg total) by mouth daily. (Patient not taking: Reported on 04/12/2019) 30 tablet 0  . naproxen (NAPROSYN) 500 MG tablet Take 1 tablet (500 mg total) by mouth 2 (two) times daily with a meal. (Patient not taking: Reported on 04/12/2019) 60 tablet 0  . sulfamethoxazole-trimethoprim (BACTRIM DS,SEPTRA DS) 800-160 MG tablet Take 1 tablet by mouth 2 (two) times daily. (Patient not taking: Reported on 04/12/2019) 20 tablet 0  . trimethoprim-polymyxin b (POLYTRIM) ophthalmic solution Place 2 drops into the left eye every 6 (six) hours. (Patient not taking: Reported on 04/12/2019) 10 mL 0   No current facility-administered medications on file prior to visit.    Allergies  Allergen Reactions  . Penicillins Rash and Other (See Comments)    Has patient had a PCN reaction causing immediate rash, facial/tongue/throat swelling, SOB or lightheadedness with hypotension: yes, [id number]} Has patient had a PCN reaction causing severe rash involving mucus membranes or skin necrosis: no:[id number]} Has patient had a PCN  reaction that required hospitalization no:[id number]} Has patient had a PCN reaction occurring within the last 10 years: [id number]} If all of the above answers are "NO", then may proceed with Cephalosporin use.     Social History:  reports that she has never smoked. She has never used smokeless tobacco. She reports previous drug use. She reports that she does not drink alcohol.  Family History  Problem Relation Age of Onset  . Cancer Father   . Breast cancer Other     The following portions of the patient's history were reviewed and updated as appropriate: allergies, current medications, past family history, past  medical history, past social history, past surgical history and problem list.  Review of Systems Pertinent items noted in HPI and remainder of comprehensive ROS otherwise negative.  Physical Exam:  BP 116/78   Pulse 96   Ht 4\' 10"  (1.473 m)   Wt 152 lb (68.9 kg)   LMP  (LMP Unknown)   Breastfeeding No   BMI 31.77 kg/m  CONSTITUTIONAL: Well-developed, well-nourished female in no acute distress.  HENT:  Normocephalic, atraumatic, External right and left ear normal. Oropharynx is clear and moist EYES: Conjunctivae and EOM are normal. Pupils are equal, round, and reactive to light. NECK: Normal range of motion, supple, no masses.  Normal thyroid.  SKIN: Skin is warm and dry. No rash noted. Not diaphoretic. No erythema. No pallor. MUSCULOSKELETAL: Normal range of motion. No tenderness.  No cyanosis, clubbing, or edema.  2+ distal pulses. NEUROLOGIC: Alert and oriented to person, place, and time. Normal reflexes, muscle tone coordination.  PSYCHIATRIC: Normal mood and affect. Normal behavior. Normal judgment and thought content. CARDIOVASCULAR: Normal heart rate noted, regular rhythm RESPIRATORY: Clear to auscultation bilaterally. Effort and breath sounds normal, no problems with respiration noted. BREASTS: Symmetric in size. No masses, tenderness, skin changes, nipple drainage, or lymphadenopathy bilaterally. ABDOMEN: Soft, no distention noted.  No tenderness, rebound or guarding.     PROCEDURES:  GYNECOLOGY CLINIC PROCEDURE NOTE  Nexplanon Removal Patient was given informed consent for removal of her Nexplanon.  Appropriate time out taken. Nexplanon site identified.  Area prepped in usual sterile fashon. One ml of 1% lidocaine was used to anesthetize the area at the distal end of the implant. A small stab incision was made right beside the implant on the distal portion.  The Nexplanon rod was grasped using hemostats and removed without difficulty.  There was minimal blood loss. There were  no complications.  A small amount of antibiotic ointment and steri-strips were applied over the small incision.  A pressure bandage was applied to reduce any bruising.  The patient tolerated the procedure well and was given post procedure instructions.  Patient is planning to use condoms for contraception.  Assessment and Plan:  1. Women's annual routine gynecological examination - Normal well woman examination  - Pap normal in December 2019 under care everywhere - next one needed 2022  2. Encounter for Nexplanon removal - See above procedure note  - Patient does not want to be on contraception at this time - plans to use condoms  - Educated and discussed birth control options in detail   Contraception options discussed  Routine preventative health maintenance measures emphasized. Please refer to After Visit Summary for other counseling recommendations.      2023, CNM [facility], [facility]

## 2019-04-19 ENCOUNTER — Encounter: Payer: Medicaid Other | Admitting: Family

## 2019-04-21 NOTE — L&D Delivery Note (Addendum)
Delivery Note Called to bedside and patient complete and pushing. At 1:15 PM a viable female was delivered via Vaginal, Spontaneous (Presentation: Left Occiput Anterior). Tight nuchal cord x1, delivered through. APGAR: 9, 9; weight pending.   Placenta status: Spontaneous, Intact.  Cord: 3 vessels with the following complications, marginal insertion: None.  Anesthesia: Epidural Episiotomy: None Lacerations: None Est. Blood Loss (mL): 150  Mom to postpartum.  Baby to Couplet care / Skin to Skin.  Alric Seton 04/06/2020, 1:36 PM

## 2019-04-26 ENCOUNTER — Encounter: Payer: Medicaid Other | Admitting: Family

## 2019-05-18 ENCOUNTER — Ambulatory Visit: Payer: Medicaid Other

## 2019-05-18 ENCOUNTER — Other Ambulatory Visit: Payer: Medicaid Other

## 2019-06-08 ENCOUNTER — Encounter: Payer: Medicaid Other | Admitting: Infectious Diseases

## 2019-06-15 ENCOUNTER — Other Ambulatory Visit: Payer: Medicaid Other

## 2019-06-15 ENCOUNTER — Ambulatory Visit: Payer: Medicaid Other

## 2019-06-29 ENCOUNTER — Encounter: Payer: Medicaid Other | Admitting: Infectious Diseases

## 2019-07-04 DIAGNOSIS — S46912A Strain of unspecified muscle, fascia and tendon at shoulder and upper arm level, left arm, initial encounter: Secondary | ICD-10-CM | POA: Diagnosis not present

## 2019-07-17 ENCOUNTER — Ambulatory Visit: Payer: Medicaid Other

## 2019-07-17 ENCOUNTER — Other Ambulatory Visit: Payer: Self-pay

## 2019-07-17 ENCOUNTER — Encounter: Payer: Self-pay | Admitting: Infectious Diseases

## 2019-07-17 ENCOUNTER — Other Ambulatory Visit: Payer: Medicaid Other

## 2019-07-17 ENCOUNTER — Other Ambulatory Visit (HOSPITAL_COMMUNITY)
Admission: RE | Admit: 2019-07-17 | Discharge: 2019-07-17 | Disposition: A | Discharge status: Home / Self Care | Payer: Medicaid Other | Source: Ambulatory Visit | Attending: Infectious Diseases | Admitting: Infectious Diseases

## 2019-07-17 DIAGNOSIS — B20 Human immunodeficiency virus [HIV] disease: Secondary | ICD-10-CM | POA: Diagnosis not present

## 2019-07-17 NOTE — Addendum Note (Signed)
Addended by: Clista Bernhardt on: 07/17/2019 09:29 AM   Modules accepted: Orders

## 2019-07-17 NOTE — Addendum Note (Signed)
Addended by: Clista Bernhardt on: 07/17/2019 09:30 AM   Modules accepted: Orders

## 2019-07-18 LAB — URINE CYTOLOGY ANCILLARY ONLY
Chlamydia: NEGATIVE
Comment: NEGATIVE
Comment: NORMAL
Neisseria Gonorrhea: NEGATIVE

## 2019-07-18 LAB — T-HELPER CELL (CD4) - (RCID CLINIC ONLY)
CD4 % Helper T Cell: 40 % (ref 33–65)
CD4 T Cell Abs: 801 /uL (ref 400–1790)

## 2019-07-21 ENCOUNTER — Other Ambulatory Visit: Payer: Self-pay

## 2019-07-21 ENCOUNTER — Inpatient Hospital Stay
Admission: EM | Admit: 2019-07-21 | Discharge: 2019-07-23 | DRG: 343 | Disposition: A | Discharge status: Home / Self Care | Payer: Medicaid Other | Attending: General Surgery | Admitting: General Surgery

## 2019-07-21 DIAGNOSIS — Z21 Asymptomatic human immunodeficiency virus [HIV] infection status: Secondary | ICD-10-CM | POA: Diagnosis present

## 2019-07-21 DIAGNOSIS — Z880 Allergy status to penicillin: Secondary | ICD-10-CM

## 2019-07-21 DIAGNOSIS — Z79899 Other long term (current) drug therapy: Secondary | ICD-10-CM

## 2019-07-21 DIAGNOSIS — K353 Acute appendicitis with localized peritonitis, without perforation or gangrene: Principal | ICD-10-CM | POA: Diagnosis present

## 2019-07-21 DIAGNOSIS — R109 Unspecified abdominal pain: Secondary | ICD-10-CM

## 2019-07-21 DIAGNOSIS — R1031 Right lower quadrant pain: Secondary | ICD-10-CM

## 2019-07-21 DIAGNOSIS — Z20822 Contact with and (suspected) exposure to covid-19: Secondary | ICD-10-CM | POA: Diagnosis present

## 2019-07-21 LAB — CBC
HCT: 39.6 % (ref 36.0–46.0)
Hemoglobin: 12.9 g/dL (ref 12.0–15.0)
MCH: 26.3 pg (ref 26.0–34.0)
MCHC: 32.6 g/dL (ref 30.0–36.0)
MCV: 80.8 fL (ref 80.0–100.0)
Platelets: 329 10*3/uL (ref 150–400)
RBC: 4.9 MIL/uL (ref 3.87–5.11)
RDW: 14 % (ref 11.5–15.5)
WBC: 9.3 10*3/uL (ref 4.0–10.5)
nRBC: 0 % (ref 0.0–0.2)

## 2019-07-21 LAB — COMPLETE METABOLIC PANEL WITH GFR
AG Ratio: 1.4 (calc) (ref 1.0–2.5)
ALT: 11 U/L (ref 6–29)
AST: 13 U/L (ref 10–30)
Albumin: 4.3 g/dL (ref 3.6–5.1)
Alkaline phosphatase (APISO): 111 U/L (ref 31–125)
BUN: 10 mg/dL (ref 7–25)
CO2: 26 mmol/L (ref 20–32)
Calcium: 9.3 mg/dL (ref 8.6–10.2)
Chloride: 105 mmol/L (ref 98–110)
Creat: 0.67 mg/dL (ref 0.50–1.10)
GFR, Est African American: 142 mL/min/{1.73_m2} (ref >=60)
GFR, Est Non African American: 122 mL/min/{1.73_m2} (ref >=60)
Globulin: 3 g/dL (calc) (ref 1.9–3.7)
Glucose, Bld: 93 mg/dL (ref 65–99)
Potassium: 4.1 mmol/L (ref 3.5–5.3)
Sodium: 139 mmol/L (ref 135–146)
Total Bilirubin: 0.3 mg/dL (ref 0.2–1.2)
Total Protein: 7.3 g/dL (ref 6.1–8.1)

## 2019-07-21 LAB — LIPID PANEL
Cholesterol: 186 mg/dL (ref <200)
HDL: 42 mg/dL — ABNORMAL LOW (ref >=50)
LDL Cholesterol (Calc): 112 mg/dL (calc) — ABNORMAL HIGH
Non-HDL Cholesterol (Calc): 144 mg/dL (calc) — ABNORMAL HIGH (ref <130)
Total CHOL/HDL Ratio: 4.4 (calc) (ref <5.0)
Triglycerides: 207 mg/dL — ABNORMAL HIGH (ref <150)

## 2019-07-21 LAB — URINALYSIS
Bilirubin Urine: NEGATIVE
Glucose, UA: NEGATIVE
Hgb urine dipstick: NEGATIVE
Ketones, ur: NEGATIVE
Nitrite: NEGATIVE
Protein, ur: NEGATIVE
Specific Gravity, Urine: 1.028 (ref 1.001–1.03)
pH: <=5 (ref 5.0–8.0)

## 2019-07-21 LAB — HIV-1/2 AB - DIFFERENTIATION
HIV-1 antibody: POSITIVE — AB
HIV-2 Ab: NEGATIVE

## 2019-07-21 LAB — CBC WITH DIFFERENTIAL/PLATELET
Absolute Monocytes: 608 cells/uL (ref 200–950)
Basophils Absolute: 38 cells/uL (ref 0–200)
Basophils Relative: 0.5 %
Eosinophils Absolute: 458 cells/uL (ref 15–500)
Eosinophils Relative: 6.1 %
HCT: 41.9 % (ref 35.0–45.0)
Hemoglobin: 13.6 g/dL (ref 11.7–15.5)
Lymphs Abs: 2048 cells/uL (ref 850–3900)
MCH: 26.2 pg — ABNORMAL LOW (ref 27.0–33.0)
MCHC: 32.5 g/dL (ref 32.0–36.0)
MCV: 80.7 fL (ref 80.0–100.0)
MPV: 10 fL (ref 7.5–12.5)
Monocytes Relative: 8.1 %
Neutro Abs: 4350 cells/uL (ref 1500–7800)
Neutrophils Relative %: 58 %
Platelets: 349 10*3/uL (ref 140–400)
RBC: 5.19 10*6/uL — ABNORMAL HIGH (ref 3.80–5.10)
RDW: 14 % (ref 11.0–15.0)
Total Lymphocyte: 27.3 %
WBC: 7.5 10*3/uL (ref 3.8–10.8)

## 2019-07-21 LAB — HEPATITIS C ANTIBODY
Hepatitis C Ab: NONREACTIVE
SIGNAL TO CUT-OFF: 0.02 (ref <1.00)

## 2019-07-21 LAB — COMPREHENSIVE METABOLIC PANEL
ALT: 15 U/L (ref 0–44)
AST: 19 U/L (ref 15–41)
Albumin: 4.1 g/dL (ref 3.5–5.0)
Alkaline Phosphatase: 101 U/L (ref 38–126)
Anion gap: 8 (ref 5–15)
BUN: 8 mg/dL (ref 6–20)
CO2: 25 mmol/L (ref 22–32)
Calcium: 8.7 mg/dL — ABNORMAL LOW (ref 8.9–10.3)
Chloride: 106 mmol/L (ref 98–111)
Creatinine, Ser: 0.56 mg/dL (ref 0.44–1.00)
GFR calc Af Amer: >60 mL/min (ref >60)
GFR calc non Af Amer: >60 mL/min (ref >60)
Glucose, Bld: 137 mg/dL — ABNORMAL HIGH (ref 70–99)
Potassium: 4.2 mmol/L (ref 3.5–5.1)
Sodium: 139 mmol/L (ref 135–145)
Total Bilirubin: 0.5 mg/dL (ref 0.3–1.2)
Total Protein: 7.2 g/dL (ref 6.5–8.1)

## 2019-07-21 LAB — POCT PREGNANCY, URINE: Preg Test, Ur: NEGATIVE

## 2019-07-21 LAB — URINALYSIS, COMPLETE (UACMP) WITH MICROSCOPIC
Bacteria, UA: NONE SEEN
Bilirubin Urine: NEGATIVE
Glucose, UA: NEGATIVE mg/dL
Hgb urine dipstick: NEGATIVE
Ketones, ur: NEGATIVE mg/dL
Nitrite: NEGATIVE
Protein, ur: NEGATIVE mg/dL
Specific Gravity, Urine: 1.016 (ref 1.005–1.030)
pH: 6 (ref 5.0–8.0)

## 2019-07-21 LAB — QUANTIFERON-TB GOLD PLUS
Mitogen-NIL: 7.97 IU/mL
NIL: 0.04 IU/mL
QuantiFERON-TB Gold Plus: NEGATIVE
TB1-NIL: <0 IU/mL
TB2-NIL: <0 IU/mL

## 2019-07-21 LAB — HIV ANTIBODY (ROUTINE TESTING W REFLEX): HIV 1&2 Ab, 4th Generation: REACTIVE — AB

## 2019-07-21 LAB — HLA B*5701: HLA-B*5701 w/rflx HLA-B High: NEGATIVE

## 2019-07-21 LAB — HEPATITIS B SURFACE ANTIBODY,QUALITATIVE: Hep B S Ab: REACTIVE — AB

## 2019-07-21 LAB — HIV-1 RNA ULTRAQUANT REFLEX TO GENTYP+
HIV 1 RNA Quant: <20 copies/mL
HIV-1 RNA Quant, Log: <1.3 Log copies/mL

## 2019-07-21 LAB — HEPATITIS B SURFACE ANTIGEN: Hepatitis B Surface Ag: NONREACTIVE

## 2019-07-21 LAB — RPR: RPR Ser Ql: NONREACTIVE

## 2019-07-21 LAB — HEPATITIS A ANTIBODY, TOTAL: Hepatitis A AB,Total: NONREACTIVE

## 2019-07-21 LAB — HEPATITIS B CORE ANTIBODY, TOTAL: Hep B Core Total Ab: NONREACTIVE

## 2019-07-21 NOTE — ED Triage Notes (Signed)
Pt with rlq pain since 0900 with nausea, no vomiting or diarrhea. Pt denies known fever. States she is also having vaginal pressure.

## 2019-07-22 ENCOUNTER — Observation Stay: Payer: Medicaid Other | Admitting: Certified Registered"

## 2019-07-22 ENCOUNTER — Emergency Department: Payer: Medicaid Other

## 2019-07-22 ENCOUNTER — Encounter
Admission: EM | Disposition: A | Discharge status: Home / Self Care | Payer: Self-pay | Source: Home / Self Care | Attending: General Surgery

## 2019-07-22 DIAGNOSIS — Z20822 Contact with and (suspected) exposure to covid-19: Secondary | ICD-10-CM | POA: Diagnosis present

## 2019-07-22 DIAGNOSIS — Z79899 Other long term (current) drug therapy: Secondary | ICD-10-CM | POA: Diagnosis not present

## 2019-07-22 DIAGNOSIS — K353 Acute appendicitis with localized peritonitis, without perforation or gangrene: Secondary | ICD-10-CM | POA: Diagnosis present

## 2019-07-22 DIAGNOSIS — Z21 Asymptomatic human immunodeficiency virus [HIV] infection status: Secondary | ICD-10-CM | POA: Diagnosis present

## 2019-07-22 DIAGNOSIS — Z880 Allergy status to penicillin: Secondary | ICD-10-CM | POA: Diagnosis not present

## 2019-07-22 DIAGNOSIS — K37 Unspecified appendicitis: Secondary | ICD-10-CM | POA: Diagnosis not present

## 2019-07-22 DIAGNOSIS — R1031 Right lower quadrant pain: Secondary | ICD-10-CM | POA: Diagnosis not present

## 2019-07-22 DIAGNOSIS — R109 Unspecified abdominal pain: Secondary | ICD-10-CM | POA: Diagnosis not present

## 2019-07-22 HISTORY — PX: LAPAROSCOPIC APPENDECTOMY: SHX408

## 2019-07-22 LAB — RESPIRATORY PANEL BY RT PCR (FLU A&B, COVID)
Influenza A by PCR: NEGATIVE
Influenza B by PCR: NEGATIVE
SARS Coronavirus 2 by RT PCR: NEGATIVE

## 2019-07-22 SURGERY — APPENDECTOMY, LAPAROSCOPIC
Anesthesia: General | Site: Abdomen

## 2019-07-22 MED ORDER — CIPROFLOXACIN IN D5W 400 MG/200ML IV SOLN
400.0000 mg | Freq: Two times a day (BID) | INTRAVENOUS | Status: DC
  Administered 2019-07-22 – 2019-07-23 (×3): 400 mg via INTRAVENOUS
  Filled 2019-07-22 (×5): qty 200

## 2019-07-22 MED ORDER — ACETAMINOPHEN 650 MG RE SUPP: 650.0000 mg | Freq: Four times a day (QID) | RECTAL | Status: DC | PRN

## 2019-07-22 MED ORDER — FENTANYL CITRATE (PF) 100 MCG/2ML IJ SOLN
25.0000 ug | INTRAMUSCULAR | Status: DC | PRN
  Administered 2019-07-22: 25 ug via INTRAVENOUS
  Administered 2019-07-22: 50 ug via INTRAVENOUS

## 2019-07-22 MED ORDER — DEXAMETHASONE SODIUM PHOSPHATE 10 MG/ML IJ SOLN
INTRAMUSCULAR | Status: AC
  Filled 2019-07-22: qty 1

## 2019-07-22 MED ORDER — METRONIDAZOLE IN NACL 5-0.79 MG/ML-% IV SOLN
500.0000 mg | Freq: Three times a day (TID) | INTRAVENOUS | Status: DC
  Administered 2019-07-22 – 2019-07-23 (×3): 500 mg via INTRAVENOUS
  Filled 2019-07-22 (×7): qty 100

## 2019-07-22 MED ORDER — PHENYLEPHRINE HCL (PRESSORS) 10 MG/ML IV SOLN
INTRAVENOUS | Status: DC | PRN
  Administered 2019-07-22: 50 ug via INTRAVENOUS
  Administered 2019-07-22: 100 ug via INTRAVENOUS

## 2019-07-22 MED ORDER — SUGAMMADEX SODIUM 200 MG/2ML IV SOLN
INTRAVENOUS | Status: DC | PRN
  Administered 2019-07-22: 250 mg via INTRAVENOUS

## 2019-07-22 MED ORDER — HYDROMORPHONE HCL 1 MG/ML IJ SOLN
1.0000 mg | INTRAMUSCULAR | Status: DC
  Filled 2019-07-22: qty 1

## 2019-07-22 MED ORDER — PROPOFOL 10 MG/ML IV BOLUS
INTRAVENOUS | Status: AC
  Filled 2019-07-22: qty 20

## 2019-07-22 MED ORDER — IOHEXOL 300 MG/ML  SOLN
100.0000 mL | Freq: Once | INTRAMUSCULAR | Status: AC | PRN
  Administered 2019-07-22: 100 mL via INTRAVENOUS

## 2019-07-22 MED ORDER — HYDROCODONE-ACETAMINOPHEN 5-325 MG PO TABS
1.0000 | ORAL_TABLET | ORAL | Status: DC | PRN
  Administered 2019-07-22 (×2): 1 via ORAL
  Administered 2019-07-22 – 2019-07-23 (×3): 2 via ORAL
  Filled 2019-07-22 (×4): qty 2

## 2019-07-22 MED ORDER — EPHEDRINE 5 MG/ML INJ
INTRAVENOUS | Status: AC
  Filled 2019-07-22: qty 10

## 2019-07-22 MED ORDER — MORPHINE SULFATE (PF) 4 MG/ML IV SOLN
4.0000 mg | INTRAVENOUS | Status: DC | PRN
  Administered 2019-07-22 (×2): 4 mg via INTRAVENOUS
  Filled 2019-07-22 (×2): qty 1

## 2019-07-22 MED ORDER — PHENYLEPHRINE HCL (PRESSORS) 10 MG/ML IV SOLN
INTRAVENOUS | Status: AC
  Filled 2019-07-22: qty 1

## 2019-07-22 MED ORDER — FENTANYL CITRATE (PF) 100 MCG/2ML IJ SOLN
INTRAMUSCULAR | Status: DC | PRN
  Administered 2019-07-22: 100 ug via INTRAVENOUS
  Administered 2019-07-22: 50 ug via INTRAVENOUS

## 2019-07-22 MED ORDER — LACTATED RINGERS IV SOLN: INTRAVENOUS | Status: DC | PRN

## 2019-07-22 MED ORDER — HYDROCODONE-ACETAMINOPHEN 5-325 MG PO TABS
ORAL_TABLET | ORAL | Status: AC
  Filled 2019-07-22: qty 1

## 2019-07-22 MED ORDER — ACETAMINOPHEN 325 MG PO TABS: 650.0000 mg | ORAL_TABLET | Freq: Four times a day (QID) | ORAL | Status: DC | PRN

## 2019-07-22 MED ORDER — ONDANSETRON HCL 4 MG/2ML IJ SOLN
4.0000 mg | Freq: Once | INTRAMUSCULAR | Status: AC
  Administered 2019-07-22: 4 mg via INTRAVENOUS
  Filled 2019-07-22: qty 2

## 2019-07-22 MED ORDER — LABETALOL HCL 5 MG/ML IV SOLN
INTRAVENOUS | Status: DC | PRN
  Administered 2019-07-22: 2.5 mg via INTRAVENOUS

## 2019-07-22 MED ORDER — SUCCINYLCHOLINE CHLORIDE 20 MG/ML IJ SOLN
INTRAMUSCULAR | Status: DC | PRN
  Administered 2019-07-22: 120 mg via INTRAVENOUS

## 2019-07-22 MED ORDER — LABETALOL HCL 5 MG/ML IV SOLN
INTRAVENOUS | Status: AC
  Filled 2019-07-22: qty 4

## 2019-07-22 MED ORDER — SODIUM CHLORIDE 0.9 % IV SOLN
2.0000 g | Freq: Once | INTRAVENOUS | Status: AC
  Administered 2019-07-22: 2 g via INTRAVENOUS
  Filled 2019-07-22: qty 20

## 2019-07-22 MED ORDER — METHOCARBAMOL 750 MG PO TABS
750.0000 mg | ORAL_TABLET | Freq: Three times a day (TID) | ORAL | Status: DC | PRN
  Filled 2019-07-22: qty 1

## 2019-07-22 MED ORDER — MIDAZOLAM HCL 2 MG/2ML IJ SOLN
INTRAMUSCULAR | Status: AC
  Filled 2019-07-22: qty 2

## 2019-07-22 MED ORDER — HYDROMORPHONE HCL 1 MG/ML IJ SOLN: 1.0000 mg | Freq: Once | INTRAMUSCULAR | Status: DC

## 2019-07-22 MED ORDER — ROCURONIUM BROMIDE 100 MG/10ML IV SOLN
INTRAVENOUS | Status: DC | PRN
  Administered 2019-07-22: 40 mg via INTRAVENOUS

## 2019-07-22 MED ORDER — GLYCOPYRROLATE 0.2 MG/ML IJ SOLN
INTRAMUSCULAR | Status: AC
  Filled 2019-07-22: qty 1

## 2019-07-22 MED ORDER — ONDANSETRON 4 MG PO TBDP: 4.0000 mg | ORAL_TABLET | Freq: Four times a day (QID) | ORAL | Status: DC | PRN

## 2019-07-22 MED ORDER — ONDANSETRON HCL 4 MG/2ML IJ SOLN
INTRAMUSCULAR | Status: AC
  Filled 2019-07-22: qty 2

## 2019-07-22 MED ORDER — KETOROLAC TROMETHAMINE 30 MG/ML IJ SOLN
15.0000 mg | INTRAMUSCULAR | Status: AC
  Administered 2019-07-22: 15 mg via INTRAVENOUS
  Filled 2019-07-22: qty 1

## 2019-07-22 MED ORDER — ELVITEG-COBIC-EMTRICIT-TENOFAF 150-150-200-10 MG PO TABS
1.0000 | ORAL_TABLET | Freq: Every day | ORAL | Status: DC
  Administered 2019-07-23: 1 via ORAL
  Filled 2019-07-22 (×2): qty 1

## 2019-07-22 MED ORDER — LIDOCAINE HCL (CARDIAC) PF 100 MG/5ML IV SOSY
PREFILLED_SYRINGE | INTRAVENOUS | Status: DC | PRN
  Administered 2019-07-22: 80 mg via INTRAVENOUS

## 2019-07-22 MED ORDER — FENTANYL CITRATE (PF) 100 MCG/2ML IJ SOLN
INTRAMUSCULAR | Status: AC
  Filled 2019-07-22: qty 2

## 2019-07-22 MED ORDER — PROPOFOL 10 MG/ML IV BOLUS
INTRAVENOUS | Status: DC | PRN
  Administered 2019-07-22: 200 mg via INTRAVENOUS

## 2019-07-22 MED ORDER — PROMETHAZINE HCL 25 MG/ML IJ SOLN: 6.2500 mg | INTRAMUSCULAR | Status: DC | PRN

## 2019-07-22 MED ORDER — SUCCINYLCHOLINE CHLORIDE 200 MG/10ML IV SOSY
PREFILLED_SYRINGE | INTRAVENOUS | Status: AC
  Filled 2019-07-22: qty 10

## 2019-07-22 MED ORDER — METRONIDAZOLE IN NACL 5-0.79 MG/ML-% IV SOLN
500.0000 mg | Freq: Once | INTRAVENOUS | Status: AC
  Administered 2019-07-22: 500 mg via INTRAVENOUS
  Filled 2019-07-22: qty 100

## 2019-07-22 MED ORDER — ALBUTEROL SULFATE (2.5 MG/3ML) 0.083% IN NEBU: 3.0000 mL | INHALATION_SOLUTION | Freq: Four times a day (QID) | RESPIRATORY_TRACT | Status: DC | PRN

## 2019-07-22 MED ORDER — DEXAMETHASONE SODIUM PHOSPHATE 10 MG/ML IJ SOLN
INTRAMUSCULAR | Status: DC | PRN
  Administered 2019-07-22: 5 mg via INTRAVENOUS

## 2019-07-22 MED ORDER — BUPIVACAINE-EPINEPHRINE 0.5% -1:200000 IJ SOLN
INTRAMUSCULAR | Status: DC | PRN
  Administered 2019-07-22: 30 mL

## 2019-07-22 MED ORDER — SEVOFLURANE IN SOLN
RESPIRATORY_TRACT | Status: AC
  Filled 2019-07-22: qty 250

## 2019-07-22 MED ORDER — LIDOCAINE HCL (PF) 2 % IJ SOLN
INTRAMUSCULAR | Status: AC
  Filled 2019-07-22: qty 5

## 2019-07-22 MED ORDER — ONDANSETRON HCL 4 MG/2ML IJ SOLN
4.0000 mg | Freq: Four times a day (QID) | INTRAMUSCULAR | Status: DC | PRN
  Administered 2019-07-22 (×2): 4 mg via INTRAVENOUS
  Filled 2019-07-22: qty 2

## 2019-07-22 MED ORDER — SODIUM CHLORIDE 0.9 % IV BOLUS
1000.0000 mL | Freq: Once | INTRAVENOUS | Status: AC
  Administered 2019-07-22: 1000 mL via INTRAVENOUS

## 2019-07-22 MED ORDER — ENOXAPARIN SODIUM 40 MG/0.4ML ~~LOC~~ SOLN
40.0000 mg | SUBCUTANEOUS | Status: DC
  Filled 2019-07-22: qty 0.4

## 2019-07-22 MED ORDER — ROCURONIUM BROMIDE 10 MG/ML (PF) SYRINGE
PREFILLED_SYRINGE | INTRAVENOUS | Status: AC
  Filled 2019-07-22: qty 10

## 2019-07-22 MED ORDER — HYDROMORPHONE HCL 1 MG/ML IJ SOLN
1.0000 mg | INTRAMUSCULAR | Status: AC
  Administered 2019-07-22: 1 mg via INTRAVENOUS
  Filled 2019-07-22: qty 1

## 2019-07-22 SURGICAL SUPPLY — 41 items
APPLIER CLIP LOGIC TI 5 (MISCELLANEOUS) ×3 IMPLANT
BLADE SURG SZ11 CARB STEEL (BLADE) ×3 IMPLANT
CANISTER SUCT 1200ML W/VALVE (MISCELLANEOUS) ×3 IMPLANT
CHLORAPREP W/TINT 26 (MISCELLANEOUS) ×3 IMPLANT
COVER WAND RF STERILE (DRAPES) ×3 IMPLANT
CUTTER FLEX LINEAR 45M (STAPLE) ×3 IMPLANT
DERMABOND ADVANCED (GAUZE/BANDAGES/DRESSINGS) ×2
DERMABOND ADVANCED .7 DNX12 (GAUZE/BANDAGES/DRESSINGS) ×1 IMPLANT
ELECT REM PT RETURN 9FT ADLT (ELECTROSURGICAL) ×3
ELECTRODE REM PT RTRN 9FT ADLT (ELECTROSURGICAL) ×1 IMPLANT
GLOVE BIO SURGEON STRL SZ 6.5 (GLOVE) ×2 IMPLANT
GLOVE BIO SURGEONS STRL SZ 6.5 (GLOVE) ×1
GLOVE BIOGEL PI IND STRL 6.5 (GLOVE) ×1 IMPLANT
GLOVE BIOGEL PI INDICATOR 6.5 (GLOVE) ×2
GOWN STRL REUS W/ TWL LRG LVL3 (GOWN DISPOSABLE) ×3 IMPLANT
GOWN STRL REUS W/TWL LRG LVL3 (GOWN DISPOSABLE) ×6
GRASPER SUT TROCAR 14GX15 (MISCELLANEOUS) ×3 IMPLANT
HANDLE YANKAUER SUCT BULB TIP (MISCELLANEOUS) ×3 IMPLANT
IRRIGATION STRYKERFLOW (MISCELLANEOUS) ×1 IMPLANT
IRRIGATOR STRYKERFLOW (MISCELLANEOUS) ×3
IV NS 1000ML (IV SOLUTION) ×2
IV NS 1000ML BAXH (IV SOLUTION) ×1 IMPLANT
KIT TURNOVER KIT A (KITS) ×3 IMPLANT
LIGASURE LAP MARYLAND 5MM 37CM (ELECTROSURGICAL) ×3 IMPLANT
NEEDLE HYPO 22GX1.5 SAFETY (NEEDLE) ×3 IMPLANT
NEEDLE VERESS 14GA 120MM (NEEDLE) ×3 IMPLANT
NS IRRIG 500ML POUR BTL (IV SOLUTION) ×3 IMPLANT
PACK LAP CHOLECYSTECTOMY (MISCELLANEOUS) ×3 IMPLANT
POUCH ENDO CATCH 10MM SPEC (MISCELLANEOUS) ×3 IMPLANT
RELOAD 45 VASCULAR/THIN (ENDOMECHANICALS) IMPLANT
RELOAD STAPLE TA45 3.5 REG BLU (ENDOMECHANICALS) ×3 IMPLANT
SCISSORS METZENBAUM CVD 33 (INSTRUMENTS) ×3 IMPLANT
SET TUBE SMOKE EVAC HIGH FLOW (TUBING) ×3 IMPLANT
SLEEVE ENDOPATH XCEL 5M (ENDOMECHANICALS) ×3 IMPLANT
SPONGE GAUZE 2X2 8PLY STER LF (GAUZE/BANDAGES/DRESSINGS) ×1
SPONGE GAUZE 2X2 8PLY STRL LF (GAUZE/BANDAGES/DRESSINGS) ×2 IMPLANT
SUT MNCRL AB 4-0 PS2 18 (SUTURE) ×3 IMPLANT
SUT VICRYL PLUS ABS 0 54 (SUTURE) ×3 IMPLANT
TRAY FOLEY MTR SLVR 16FR STAT (SET/KITS/TRAYS/PACK) ×3 IMPLANT
TROCAR XCEL 12X100 BLDLESS (ENDOMECHANICALS) ×3 IMPLANT
TROCAR XCEL NON-BLD 5MMX100MML (ENDOMECHANICALS) ×3 IMPLANT

## 2019-07-22 NOTE — Op Note (Signed)
Preoperative diagnosis: Acute appendicitis.  Postoperative diagnosis: Acute appendicitis  Procedure: Laparoscopic appendectomy.  Anesthesia: GETA  Surgeon: Dr. Hazle Quant, MD  Wound Classification: Contaminated  Indications: Patient is a 24 y.o. female  presented with right lower quadrant pain of one day of duration. Computed tomography scan and physical examination were consistent with acute appendicitis.   Findings: 1. Acutely inflamed appendix with abundant reactive fluid around the appendix.  2. No peri-appendiceal abscess or phlegmon 3. Normal anatomy 4. Adequate hemostasis.   Description of procedure: The patient was placed on the operating table in the supine position. General anesthesia was induced. A time-out was completed verifying correct patient, procedure, site, positioning, and implant(s) and/or special equipment prior to beginning this procedure. A Foley catheter and orogastric tubes were placed. The abdomen was prepped and draped in the usual sterile fashion.  An incision was made in a natural skin line lateral to the umbilicus.   The fascia was elevated and the Veress needle inserted. Proper position was confirmed by aspiration and saline meniscus test. The abdomen was insufflated with carbon dioxide to a pressure of 15 mmHg. The patient tolerated insufflation well. A 5-mm optiview trocar was then inserted supraumbilically. The laparoscope was inserted and the abdomen inspected. No injuries from initial trocar placement were noted. Turbid fluid was noted in the right lower quadrant. Under direct visualization, an 12-mm trocar was inserted in the left lower quadrant lateral to the rectus muscle. A 5-mm port was then placed above the symphysis pubis on midline.  Care was taken to avoid injury to the bladder or inferior epigastric vessels. The table was placed in the Trendelenburg position with the right side elevated.  The cecum was gently grasped with an endoscopic graspers  and pulled toward (the left upper quadrant). An atraumatic grasper was then passed through the suprapubic port and omentum was dissected away until the appendix was identified. The appendix was then grasped and elevated. It was noted to be inflamed.  An endoscopic linear cutting stapler was then used to divide and staple the base of the appendix. Mesoappendix divided with LigaSure Device.  The appendix was placed in an endoscopic retrieval bag and removed.  The appendiceal stump was then irrigated and hemostasis was assured. Fluid was suctioned and no other pathology was identified.  Secondary trocars were removed under direct vision. No bleeding was noted. The laparoscope was withdrawn and the umbilical trocar removed. The abdomen was allowed to collapse. All trocar sites greater than 5 mm were closed with Vicryl 0. The skin was closed with subcuticular sutures Monocryl 3-0 of and steristrips.  The patient tolerated the procedure well and was taken to the postanesthesia care unit in satisfactory condition.   Specimen: Appendix  Complications: None  Estimated Blood Loss: 2 mL

## 2019-07-22 NOTE — Transfer of Care (Signed)
Immediate Anesthesia Transfer of Care Note  Patient: Erica Walton  Procedure(s) Performed: APPENDECTOMY LAPAROSCOPIC (N/A Abdomen)  Patient Location: PACU  Anesthesia Type:General  Level of Consciousness: drowsy  Airway & Oxygen Therapy: Patient Spontanous Breathing  Post-op Assessment: Report given to RN  Post vital signs: Reviewed and stable  Last Vitals:  Vitals Value Taken Time  BP 104/61 07/22/19 0909  Temp    Pulse 100 07/22/19 0909  Resp 16 07/22/19 0909  SpO2 96 % 07/22/19 0909  Vitals shown include unvalidated device data.  Last Pain:  Vitals:   07/22/19 0727  TempSrc:   PainSc: 3          Complications: No apparent anesthesia complications

## 2019-07-22 NOTE — Anesthesia Procedure Notes (Signed)
Procedure Name: Intubation Date/Time: 07/22/2019 8:12 AM Performed by: Orion Crook, CRNA Pre-anesthesia Checklist: Patient identified, Emergency Drugs available, Suction available, Patient being monitored and Timeout performed Patient Re-evaluated:Patient Re-evaluated prior to induction Oxygen Delivery Method: Circle system utilized Preoxygenation: Pre-oxygenation with 100% oxygen Induction Type: IV induction and Rapid sequence Laryngoscope Size: Mac and 3 Grade View: Grade I Tube type: Oral Number of attempts: 1 Airway Equipment and Method: Stylet Placement Confirmation: ETT inserted through vocal cords under direct vision,  positive ETCO2 and breath sounds checked- equal and bilateral Secured at: 21 cm Tube secured with: Tape

## 2019-07-22 NOTE — H&P (Signed)
SURGICAL CONSULTATION NOTE   HISTORY OF PRESENT ILLNESS (HPI):  24 y.o. female presented to [facility] ED for evaluation of abdominal pain. Patient reports started having abdominal pain yesterday at 9 AM.  The pain was in the right side of the abdomen.  The pain has been slowly radiating to the right back and right groin.  The pain continued to get worse during the day and she decided to come to the ED. Denies nausea or vomiting, fever or chills.  At the ED she had labs that shows no leukocytosis.  She had an ultrasound of the abdomen that shows normal gallbladder with no stones.  She also had endovaginal ultrasound that shows normal ovaries without evidence of torsion or any pathology.  She also had CT scan of the abdomen and pelvis that shows mildly inflamed appendix.  There is no abscess, no free fluid or no free air.  I personally evaluated all the images.  Surgery is consulted by Dr. Scotty Court in this context for evaluation and management of right-sided abdominal pain with suspected acute appendicitis.  PAST MEDICAL HISTORY (PMH):  Past Medical History:  Diagnosis Date  . Genital herpes   . HIV (human immunodeficiency virus infection) (HCC)      PAST SURGICAL HISTORY (PSH):  No past surgical history on file.   MEDICATIONS:  Prior to Admission medications   Medication Sig Start Date End Date Taking? Authorizing Provider  elvitegravir-cobicistat-emtricitabine-tenofovir (GENVOYA) 150-150-200-10 MG TABS tablet Take 1 tablet by mouth daily with breakfast.  11/01/17  Yes [provider]  albuterol (PROVENTIL HFA;VENTOLIN HFA) 108 (90 Base) MCG/ACT inhaler Inhale 2 puffs into the lungs every 6 (six) hours as needed for wheezing or shortness of breath. 09/26/17   Enid Derry, PA-C  methocarbamol (ROBAXIN) 750 MG tablet Take 750 mg by mouth 3 (three) times daily as needed. 07/04/19   [provider]     ALLERGIES:  Allergies  Allergen Reactions  . Penicillins Rash and Other (See  Comments)    Has patient had a PCN reaction causing immediate rash, facial/tongue/throat swelling, SOB or lightheadedness with hypotension: yes, XBLT:[id number]} Has patient had a PCN reaction causing severe rash involving mucus membranes or skin necrosis: no:[id number]} Has patient had a PCN reaction that required hospitalization no:[id number]} Has patient had a PCN reaction occurring within the last 10 years: no:[id number]} If all of the above answers are "NO", then may proceed with Cephalosporin use.      SOCIAL HISTORY:  Social History   Socioeconomic History  . Marital status: Married    Spouse name: Not on file  . Number of children: Not on file  . Years of education: Not on file  . Highest education level: Not on file  Occupational History  . Not on file  Tobacco Use  . Smoking status: Never Smoker  . Smokeless tobacco: Never Used  Substance and Sexual Activity  . Alcohol use: No  . Drug use: Not Currently  . Sexual activity: Yes    Birth control/protection: Implant  Other Topics Concern  . Not on file  Social History Narrative  . Not on file   Social Determinants of Health   Financial Resource Strain:   . Difficulty of Paying Living Expenses:   Food Insecurity:   . Worried About Programme researcher, broadcasting/film/video in the Last Year:   . Barista in the Last Year:   Transportation Needs:   . Freight forwarder (Medical):   Marland Kitchen Lack of Transportation (Non-Medical):  Physical Activity:   . Days of Exercise per Week:   . Minutes of Exercise per Session:   Stress:   . Feeling of Stress :   Social Connections:   . Frequency of Communication with Friends and Family:   . Frequency of Social Gatherings with Friends and Family:   . Attends Religious Services:   . Active Member of Clubs or Organizations:   . Attends Banker Meetings:   Marland Kitchen Marital Status:   Intimate Partner Violence:   . Fear of Current or Ex-Partner:   . Emotionally Abused:   Marland Kitchen Physically Abused:    . Sexually Abused:       FAMILY HISTORY:  Family History  Problem Relation Age of Onset  . Cancer Father   . Breast cancer Other      REVIEW OF SYSTEMS:  Constitutional: denies weight loss, fever, chills, or sweats  Eyes: denies any other vision changes, history of eye injury  ENT: denies sore throat, hearing problems  Respiratory: denies shortness of breath, wheezing  Cardiovascular: denies chest pain, palpitations  Gastrointestinal: positive abdominal pain, nausea and vomiting Genitourinary: denies burning with urination or urinary frequency Musculoskeletal: denies any other joint pains or cramps  Skin: denies any other rashes or skin discolorations  Neurological: denies any other headache, dizziness, weakness  Psychiatric: denies any other depression, anxiety   All other review of systems were negative   VITAL SIGNS:  Temp:  [97.4 F (36.3 C)] 97.4 F (36.3 C) (04/02 2253) Pulse Rate:  [90-128] 99 (04/03 0637) Resp:  [16-20] 20 (04/03 0637) BP: (97-112)/(60-79) 105/79 (04/03 0637) SpO2:  [97 %-100 %] 97 % (04/03 0637) Weight:  [73 kg] 73 kg (04/02 2254)     Height: 4\' 10"  (147.3 cm) Weight: 73 kg BMI (Calculated): 33.66   INTAKE/OUTPUT:  This shift: Total I/O In: 1100 [IV Piggyback:1100] Out: -   Last 2 shifts: @IOLAST2SHIFTS @   PHYSICAL EXAM:  Constitutional:  -- Normal body habitus  -- Awake, alert, and oriented x3  Eyes:  -- Pupils equally round and reactive to light  -- No scleral icterus  Ear, nose, and throat:  -- No jugular venous distension  Pulmonary:  -- No crackles  -- Equal breath sounds bilaterally -- Breathing non-labored at rest Cardiovascular:  -- S1, S2 present  -- No pericardial rubs Gastrointestinal:  -- Abdomen soft, tender to palpation on the right side and right back, non-distended, no guarding or rebound tenderness -- No abdominal masses appreciated, pulsatile or otherwise  Musculoskeletal and Integumentary:  -- Wounds or skin  discoloration: None appreciated -- Extremities: B/L UE and LE FROM, hands and feet warm, no edema  Neurologic:  -- Motor function: intact and symmetric -- Sensation: intact and symmetric   Labs:  CBC Latest Ref Rng & Units 07/21/2019 07/17/2019 04/03/2015  WBC 4.0 - 10.5 K/uL 9.3 7.5 13.5(H)  Hemoglobin 12.0 - 15.0 g/dL 07/19/2019 04/05/2015 63.1  Hematocrit 36.0 - 46.0 % 39.6 41.9 43.0  Platelets 150 - 400 K/uL 329 349 315   CMP Latest Ref Rng & Units 07/21/2019 07/17/2019 04/03/2015  Glucose 70 - 99 mg/dL 07/19/2019) 93 04/05/2015)  BUN 6 - 20 mg/dL 8 10 11   Creatinine 0.44 - 1.00 mg/dL 378(H 885(O  Sodium 135 - 145 mmol/L 139 139 138  Potassium 3.5 - 5.1 mmol/L 4.2 4.1 4.0  Chloride 98 - 111 mmol/L 106 105 110  CO2 22 - 32 mmol/L 25 26 24   Calcium 8.9 - 10.3 mg/dL  8.7(L) 9.3 9.2  Total Protein 6.5 - 8.1 g/dL 7.2 7.3 -  Total Bilirubin 0.3 - 1.2 mg/dL 0.5 0.3 -  Alkaline Phos 38 - 126 U/L 101 - -  AST 15 - 41 U/L 19 13 -  ALT 0 - 44 U/L 15 11 -    Imaging studies:  EXAM: CT ABDOMEN AND PELVIS WITH CONTRAST  TECHNIQUE: Multidetector CT imaging of the abdomen and pelvis was performed using the standard protocol following bolus administration of intravenous contrast.  CONTRAST:  180mL OMNIPAQUE IOHEXOL 300 MG/ML  SOLN  COMPARISON:  None.  FINDINGS: LOWER CHEST: Normal.  HEPATOBILIARY: Normal hepatic contours. No intra- or extrahepatic biliary dilatation. Normal gallbladder.  PANCREAS: Normal pancreas. No ductal dilatation or peripancreatic fluid collection.  SPLEEN: Normal.  ADRENALS/URINARY TRACT: The adrenal glands are normal. No hydronephrosis, nephroureterolithiasis or solid renal mass. The urinary bladder is normal for degree of distention  STOMACH/BOWEL: There is no hiatal hernia. Normal duodenal course and caliber. No small bowel dilatation or inflammation. No focal colonic abnormality.  Appendix location: Retrocecal (11 o'clock)  Diameter: 5  mm  Appendicolith: None  Mucosal hyper-enhancement: Very mild adjacent fat stranding  Extraluminal gas: None  Periappendiceal collection: None  VASCULAR/LYMPHATIC: Normal course and caliber of the major abdominal vessels. No abdominal or pelvic lymphadenopathy.  REPRODUCTIVE: Normal uterus and ovaries.  MUSCULOSKELETAL. No bony spinal canal stenosis or focal osseous abnormality.  OTHER: None.  IMPRESSION: 1. Mild fat stranding adjacent to the appendix could indicate early acute appendicitis in the appropriate clinical context. 2. Otherwise normal CT of the abdomen and pelvis.   Electronically Signed   By: Ulyses Jarred M.D.   On: 07/22/2019 02:13  Assessment/Plan:  24 y.o. female with right-sided abdominal pain with suspected acute appendicitis, complicated by pertinent comorbidities including initiating.  Patient with history, physical exam and images consistent with acute appendicitis.  Even though the pain seems to be out of proportion to the amount of inflammation seen on CT scan, the fact that the comparison along there are no kidney stones the ovaries are normal and there is mild inflammation of the CT scan of the appendix, appendicitis is currently the highest in the differential diagnosis.  Patient oriented about diagnosis and surgical management as treatment.  Long discussion with the patient understanding that there is a possibility that the appendix looks normal.  If the appendix looks normal my recommendation is to have it removed anyways as a diagnostic and therapeutic approach.  Patient oriented about goals of surgery and its risk including: bowel injury, infection, abscess, bleeding, leak from cecum, intestinal adhesions, bowel obstruction, fistula, injury to the ureter among others.  If pain persist after appendectomy the other differential diagnoses passed kidney stone that was missed on the CT scan because of pain as a stimulatory story as a kidney stone  passing.  The urinalysis is normal and there is no evidence of kidney stones on the CT scan.  Patient has no history of previous kidney stones. Patient understood and agreed to proceed with surgery. Will admit patient, already started on antibiotic therapy, will give IV hydration since patient is NPO and schedule to OR.   Arnold Long, MD

## 2019-07-22 NOTE — Anesthesia Postprocedure Evaluation (Signed)
Anesthesia Post Note  Patient: Erica Walton  Procedure(s) Performed: APPENDECTOMY LAPAROSCOPIC (N/A Abdomen)  Patient location during evaluation: PACU Anesthesia Type: General Level of consciousness: awake and alert Pain management: pain level controlled Vital Signs Assessment: post-procedure vital signs reviewed and stable Respiratory status: spontaneous breathing, nonlabored ventilation, respiratory function stable and patient connected to nasal cannula oxygen Cardiovascular status: blood pressure returned to baseline and stable Postop Assessment: no apparent nausea or vomiting Anesthetic complications: no     Last Vitals:  Vitals:   07/22/19 1221 07/22/19 1300  BP:  99/63  Pulse: (!) 104 100  Resp: (!) 26 13  Temp:    SpO2: 95% 96%    Last Pain:  Vitals:   07/22/19 1300  TempSrc:   PainSc: Asleep                 Lenard Simmer

## 2019-07-22 NOTE — ED Notes (Signed)
Pt transported to US

## 2019-07-22 NOTE — Anesthesia Preprocedure Evaluation (Signed)
Anesthesia Evaluation  Patient identified by MRN, date of birth, ID band Patient awake    Reviewed: Allergy & Precautions, H&P , NPO status , Patient's Chart, lab work & pertinent test results, reviewed documented beta blocker date and time   History of Anesthesia Complications Negative for: history of anesthetic complications  Airway Mallampati: III  TM Distance: >3 FB Neck ROM: full    Dental  (+) Dental Advidsory Given, Missing, Teeth Intact   Pulmonary neg shortness of breath, asthma , neg recent URI,    Pulmonary exam normal breath sounds clear to auscultation       Cardiovascular Exercise Tolerance: Good negative cardio ROS Normal cardiovascular exam Rhythm:regular Rate:Normal     Neuro/Psych negative neurological ROS  negative psych ROS   GI/Hepatic negative GI ROS, Neg liver ROS,   Endo/Other  negative endocrine ROS  Renal/GU negative Renal ROS  negative genitourinary   Musculoskeletal   Abdominal   Peds  Hematology  (+) HIV,   Anesthesia Other Findings Past Medical History: No date: Genital herpes No date: HIV (human immunodeficiency virus infection) (HCC)   Reproductive/Obstetrics negative OB ROS                             Anesthesia Physical Anesthesia Plan  ASA: II and emergent  Anesthesia Plan: General   Post-op Pain Management:    Induction: Intravenous  PONV Risk Score and Plan: 3 and Ondansetron, Dexamethasone, Midazolam and Promethazine  Airway Management Planned: Oral ETT  Additional Equipment:   Intra-op Plan:   Post-operative Plan: Extubation in OR  Informed Consent: I have reviewed the patients History and Physical, chart, labs and discussed the procedure including the risks, benefits and alternatives for the proposed anesthesia with the patient or authorized representative who has indicated his/her understanding and acceptance.     Dental  Advisory Given  Plan Discussed with: Anesthesiologist, CRNA and Surgeon  Anesthesia Plan Comments:         Anesthesia Quick Evaluation

## 2019-07-22 NOTE — ED Notes (Signed)
Pt transported to OR by OR orderly Josh. Pt in NAD at time of transport.

## 2019-07-22 NOTE — ED Notes (Signed)
Pt transported to CT ?

## 2019-07-22 NOTE — ED Provider Notes (Signed)
[facility] Emergency Department Provider Note  ____________________________________________  Time seen: Approximately 5:43 AM  I have reviewed the triage vital signs and the nursing notes.   HISTORY  Chief Complaint Abdominal Pain    HPI Erica Walton is a 24 y.o. female with a history of HIV, currently well controlled, who complains of right lower quadrant abdominal pain since 9:00 AM yesterday.  Gradual onset, worsening, constant, waxing and waning, radiates to groin, no aggravating or alleviating factors.  Denies fever or chills, no vomiting diarrhea or constipation.  Denies any irregular vaginal bleeding or discharge.  Notes that she recently was seen on March 29 and tested for STI which is negative.  Electronic records reviewed, also shows her CD4 count was 800, HIV viral load nondetectable.  Past Medical History:  Diagnosis Date  . Genital herpes   . HIV (human immunodeficiency virus infection) [facility])      Patient Active Problem List   Diagnosis Date Noted  . SVD (spontaneous vaginal delivery) 12/06/2017  . Lack of food 05/17/2013  . Anemia 11/17/2011  . Asthma 11/17/2011  . Human immunodeficiency virus (HIV) disease (HCC) 11/17/2011     No past surgical history on file.   Prior to Admission medications   Medication Sig Start Date End Date Taking? Authorizing Provider  albuterol (PROVENTIL HFA;VENTOLIN HFA) 108 (90 Base) MCG/ACT inhaler Inhale 2 puffs into the lungs every 6 (six) hours as needed for wheezing or shortness of breath. 09/26/17  Yes Enid Derry, PA-C  elvitegravir-cobicistat-emtricitabine-tenofovir (GENVOYA) 150-150-200-10 MG TABS tablet Take 1 tablet by mouth daily with breakfast.  11/01/17  Yes [provider]  methocarbamol (ROBAXIN) 750 MG tablet Take 750 mg by mouth 3 (three) times daily as needed. 07/04/19  Yes [provider]     Allergies Penicillins   Family History  Problem Relation Age of  Onset  . Cancer Father   . Breast cancer Other     Social History Social History   Tobacco Use  . Smoking status: Never Smoker  . Smokeless tobacco: Never Used  Substance Use Topics  . Alcohol use: No  . Drug use: Not Currently    Review of Systems  Constitutional:   No fever or chills.  ENT:   No sore throat. No rhinorrhea. Cardiovascular:   No chest pain or syncope. Respiratory:   No dyspnea or cough. Gastrointestinal: Positive as above for abdominal pain without vomiting and diarrhea.  Musculoskeletal:   Negative for focal pain or swelling All other systems reviewed and are negative except as documented above in ROS and HPI.  ____________________________________________   PHYSICAL EXAM:  VITAL SIGNS: ED Triage Vitals  Enc Vitals Group     BP 07/21/19 2253 112/60     Pulse Rate 07/21/19 2253 (!) 104     Resp 07/21/19 2253 20     Temp 07/21/19 2253 (!) 97.4 F (36.3 C)     Temp Source 07/21/19 2253 Oral     SpO2 07/21/19 2253 100 %     Weight 07/21/19 2254 161 lb (73 kg)     Height 07/21/19 2254 4\' 10"  (1.473 m)     Head Circumference --      Peak Flow --      Pain Score 07/21/19 2254 10     Pain Loc --      Pain Edu? --      Excl. in [place]? --     Vital signs reviewed, nursing assessments reviewed.   Constitutional:  Alert and oriented. Non-toxic appearance. Eyes:   Conjunctivae are normal. EOMI. PERRL. ENT      Head:   Normocephalic and atraumatic.      Nose:   Wearing a mask.      Mouth/Throat:   Wearing a mask.      Neck:   No meningismus. Full ROM. Hematological/Lymphatic/Immunilogical:   No cervical lymphadenopathy. Cardiovascular:   RRR. Symmetric bilateral radial and DP pulses.  No murmurs. Cap refill less than 2 seconds. Respiratory:   Normal respiratory effort without tachypnea/retractions. Breath sounds are clear and equal bilaterally. No wheezes/rales/rhonchi. Gastrointestinal:   Soft with diffuse right-sided and lower abdominal tenderness,  much more pronounced in the right lower quadrant.. Non distended. There is no CVA tenderness.  No rebound, rigidity, or guarding.  Musculoskeletal:   Normal range of motion in all extremities. No joint effusions.  No lower extremity tenderness.  No edema. Neurologic:   Normal speech and language.  Motor grossly intact. No acute focal neurologic deficits are appreciated.  Skin:    Skin is warm, dry and intact. No rash noted.  No petechiae, purpura, or bullae.  ____________________________________________    LABS (pertinent positives/negatives) (all labs ordered are listed, but only abnormal results are displayed) Labs Reviewed  COMPREHENSIVE METABOLIC PANEL - Abnormal; Notable for the following components:      Result Value   Glucose, Bld 137 (*)    Calcium 8.7 (*)    All other components within normal limits  URINALYSIS, COMPLETE (UACMP) WITH MICROSCOPIC - Abnormal; Notable for the following components:   Color, Urine YELLOW (*)    APPearance CLOUDY (*)    Leukocytes,Ua TRACE (*)    All other components within normal limits  RESPIRATORY PANEL BY RT PCR (FLU A&B, COVID)  CBC  POC URINE PREG, ED  POCT PREGNANCY, URINE   ____________________________________________   EKG    ____________________________________________    RADIOLOGY  CT ABDOMEN PELVIS W CONTRAST  Result Date: 07/22/2019 CLINICAL DATA:  Right lower quadrant pain EXAM: CT ABDOMEN AND PELVIS WITH CONTRAST TECHNIQUE: Multidetector CT imaging of the abdomen and pelvis was performed using the standard protocol following bolus administration of intravenous contrast. CONTRAST:  156mL OMNIPAQUE IOHEXOL 300 MG/ML  SOLN COMPARISON:  None. FINDINGS: LOWER CHEST: Normal. HEPATOBILIARY: Normal hepatic contours. No intra- or extrahepatic biliary dilatation. Normal gallbladder. PANCREAS: Normal pancreas. No ductal dilatation or peripancreatic fluid collection. SPLEEN: Normal. ADRENALS/URINARY TRACT: The adrenal glands are  normal. No hydronephrosis, nephroureterolithiasis or solid renal mass. The urinary bladder is normal for degree of distention STOMACH/BOWEL: There is no hiatal hernia. Normal duodenal course and caliber. No small bowel dilatation or inflammation. No focal colonic abnormality. Appendix location: Retrocecal (11 o'clock) Diameter: 5 mm Appendicolith: None Mucosal hyper-enhancement: Very mild adjacent fat stranding Extraluminal gas: None Periappendiceal collection: None VASCULAR/LYMPHATIC: Normal course and caliber of the major abdominal vessels. No abdominal or pelvic lymphadenopathy. REPRODUCTIVE: Normal uterus and ovaries. MUSCULOSKELETAL. No bony spinal canal stenosis or focal osseous abnormality. OTHER: None. IMPRESSION: 1. Mild fat stranding adjacent to the appendix could indicate early acute appendicitis in the appropriate clinical context. 2. Otherwise normal CT of the abdomen and pelvis. Electronically Signed   By: Ulyses Jarred M.D.   On: 07/22/2019 02:13   US PELVIC COMPLETE W TRANSVAGINAL AND TORSION R/O  Result Date: 07/22/2019 CLINICAL DATA:  RIGHT-sided abdominal pain. EXAM: TRANSABDOMINAL AND TRANSVAGINAL ULTRASOUND OF PELVIS DOPPLER ULTRASOUND OF OVARIES TECHNIQUE: Both transabdominal and transvaginal ultrasound examinations of the pelvis were performed. Transabdominal technique  was performed for global imaging of the pelvis including uterus, ovaries, adnexal regions, and pelvic cul-de-sac. It was necessary to proceed with endovaginal exam following the transabdominal exam to visualize the adnexal structures to an adequate degree. Color and duplex Doppler ultrasound was utilized to evaluate blood flow to the ovaries. COMPARISON:  None. FINDINGS: Uterus Measurements: 9.8 x 4.7 x 5.8 cm = volume: 138 mL. No fibroids or other mass visualized. Endometrium Thickness: 2 mm.  No focal abnormality visualized. Right ovary Measurements: 3.4 x 2.2 x 2.2 cm = volume: 8.7 mL. Normal appearance/no adnexal mass.  Left ovary Measurements: 3.2 x 1.3 x 2.0 cm = volume: 4.4w mL. Normal appearance/no adnexal mass. Pulsed Doppler evaluation of both ovaries demonstrates normal low-resistance arterial and venous waveforms. Other findings No abnormal free fluid. IMPRESSION: Normal pelvic ultrasound.  No evidence of ovarian torsion. Electronically Signed   By: Bary Richard M.D.   On: 07/22/2019 05:15   US ABDOMEN LIMITED RUQ  Result Date: 07/22/2019 CLINICAL DATA:  Right-sided abdominal pain EXAM: ULTRASOUND ABDOMEN LIMITED RIGHT UPPER QUADRANT COMPARISON:  None. FINDINGS: Gallbladder: No gallstones or wall thickening visualized. No sonographic Murphy sign noted by sonographer. Common bile duct: Diameter: 1 mm Liver: No focal lesion identified. Within normal limits in parenchymal echogenicity. Portal vein is patent on color Doppler imaging with normal direction of blood flow towards the liver. Other: None. IMPRESSION: Normal right upper quadrant ultrasound Electronically Signed   By: Deatra Robinson M.D.   On: 07/22/2019 05:06    ____________________________________________   PROCEDURES Procedures  ____________________________________________  DIFFERENTIAL DIAGNOSIS   Appendicitis, ovarian cyst, cholecystitis, ureterolithiasis  CLINICAL IMPRESSION / ASSESSMENT AND PLAN / ED COURSE  Medications ordered in the ED: Medications  metroNIDAZOLE (FLAGYL) IVPB 500 mg (500 mg Intravenous New Bag/Given 07/22/19 0619)  HYDROmorphone (DILAUDID) injection 1 mg (has no administration in time range)  ondansetron (ZOFRAN) injection 4 mg (4 mg Intravenous Given 07/22/19 0118)  sodium chloride 0.9 % bolus 1,000 mL (0 mLs Intravenous Stopped 07/22/19 0402)  ketorolac (TORADOL) 30 MG/ML injection 15 mg (15 mg Intravenous Given 07/22/19 0119)  HYDROmorphone (DILAUDID) injection 1 mg (1 mg Intravenous Given 07/22/19 0140)  iohexol (OMNIPAQUE) 300 MG/ML solution 100 mL (100 mLs Intravenous Contrast Given 07/22/19 0203)  cefTRIAXone  (ROCEPHIN) 2 g in sodium chloride 0.9 % 100 mL IVPB (0 g Intravenous Stopped 07/22/19 4098)    Pertinent labs & imaging results that were available during my care of the patient were reviewed by me and considered in my medical decision making (see chart for details).  Tinesha Siegrist Dollison was evaluated in Emergency Department on 07/22/2019 for the symptoms described in the history of present illness. She was evaluated in the context of the global COVID-19 pandemic, which necessitated consideration that the patient might be at risk for infection with the SARS-CoV-2 virus that causes COVID-19. Institutional protocols and algorithms that pertain to the evaluation of patients at risk for COVID-19 are in a state of rapid change based on information released by regulatory bodies including the CDC and federal and state organizations. These policies and algorithms were followed during the patient's care in the ED.   Patient presents with severe abdominal pain and tenderness.  Poorly localized on exam, will need to proceed with CT scan to further evaluate with most likely diagnosis appendicitis.  Doubt ovarian torsion TOA PID.  Patient's not septic.  Ordered Toradol 15 mg IV and Dilaudid 1 mg IV for initial pain relief.  1 L fluid bolus for  hydration.  ----------------------------------------- 5:46 AM on 07/22/2019 -----------------------------------------  CT scan shows mild fat stranding around the appendix, concerning for early appendicitis.  Ultrasound of right upper quadrant negative for cholecystitis.  Pelvic ultrasound negative for ovarian cyst or signs of torsion.  Ceftriaxone and Flagyl ordered for antibiotic coverage.  Will consult surgery.  ----------------------------------------- 6:38 AM on 07/22/2019 -----------------------------------------  Discussed with Dr. Maia Plan who will come evaluate.      ____________________________________________   FINAL CLINICAL IMPRESSION(S) / ED  DIAGNOSES    Final diagnoses:  RLQ abdominal pain     ED Discharge Orders    None      Portions of this note were generated with dragon dictation software. Dictation errors may occur despite best attempts at proofreading.   Sharman Cheek, MD 07/22/19 [phone]

## 2019-07-23 MED ORDER — HYDROCODONE-ACETAMINOPHEN 5-325 MG PO TABS: 1.0000 | ORAL_TABLET | ORAL | 0 refills | Status: AC | PRN

## 2019-07-23 NOTE — Discharge Summary (Signed)
  Patient ID: Erica Walton MRN: [id number] DOB/AGE: December 26, 1995 23 y.o.  Admit date: 07/21/2019 Discharge date: 07/23/2019   Discharge Diagnoses:  Active Problems:   Acute appendicitis with localized peritonitis   Procedures: Laparoscopic appendectomy  Hospital Course: Patient with acute appendicitis.  She underwent laparoscopic appendectomy.  She tolerated the procedure well.  The pain has been improving significantly since surgery.  She is controlling the pain with Norco.  She is able to ambulate.  She is tolerating diet.  Wounds are healing well.  Physical Exam  Constitutional: She is oriented to person, place, and time and well-developed, well-nourished, and in no distress.  Pulmonary/Chest: Effort normal.  Abdominal: Soft. She exhibits no distension. There is no abdominal tenderness. There is no rebound.  Neurological: She is alert and oriented to person, place, and time.  Abdominal wounds dry and clean   Consults: None  Disposition: Discharge disposition: 01-Home or Self Care       Discharge Instructions    Diet - low sodium heart healthy   Complete by: As directed    Increase activity slowly   Complete by: As directed      Allergies as of 07/23/2019      Reactions   Penicillins Rash, Other (See Comments)   Has patient had a PCN reaction causing immediate rash, facial/tongue/throat swelling, SOB or lightheadedness with hypotension: yes, [id number]} Has patient had a PCN reaction causing severe rash involving mucus membranes or skin necrosis: no:[id number]} Has patient had a PCN reaction that required hospitalization no:[id number]} Has patient had a PCN reaction occurring within the last 10 years: no:[id number]} If all of the above answers are "NO", then may proceed with Cephalosporin use.      Medication List    TAKE these medications   albuterol 108 (90 Base) MCG/ACT inhaler Commonly known as: VENTOLIN HFA Inhale 2 puffs into the lungs every 6 (six) hours  as needed for wheezing or shortness of breath.   elvitegravir-cobicistat-emtricitabine-tenofovir 150-150-200-10 MG Tabs tablet Commonly known as: GENVOYA Take 1 tablet by mouth daily with breakfast.   HYDROcodone-acetaminophen 5-325 MG tablet Commonly known as: Norco Take 1 tablet by mouth every 4 (four) hours as needed for up to 3 days for moderate pain.   methocarbamol 750 MG tablet Commonly known as: ROBAXIN Take 750 mg by mouth 3 (three) times daily as needed.      Follow-up Information    Carolan Shiver, MD Follow up in 2 week(s).   Specialty: General Surgery Contact information: [address] [place] [address] [phone]

## 2019-07-23 NOTE — Progress Notes (Signed)
Erica Walton  A and O x 4 VSS. Pt tolerating diet well. No complaints of pain or nausea. IV removed intact, prescriptions given. Pt voices understanding of discharge instructions with no further questions. Pt discharged via wheelchair with axillary.   Allergies as of 07/23/2019      Reactions   Penicillins Rash, Other (See Comments)   Has patient had a PCN reaction causing immediate rash, facial/tongue/throat swelling, SOB or lightheadedness with hypotension: yes, [id number]} Has patient had a PCN reaction causing severe rash involving mucus membranes or skin necrosis: no:[id number]} Has patient had a PCN reaction that required hospitalization no:[id number]} Has patient had a PCN reaction occurring within the last 10 years: no:[id number]} If all of the above answers are "NO", then may proceed with Cephalosporin use.      Medication List    TAKE these medications   albuterol 108 (90 Base) MCG/ACT inhaler Commonly known as: VENTOLIN HFA Inhale 2 puffs into the lungs every 6 (six) hours as needed for wheezing or shortness of breath.   elvitegravir-cobicistat-emtricitabine-tenofovir 150-150-200-10 MG Tabs tablet Commonly known as: GENVOYA Take 1 tablet by mouth daily with breakfast.   HYDROcodone-acetaminophen 5-325 MG tablet Commonly known as: Norco Take 1 tablet by mouth every 4 (four) hours as needed for up to 3 days for moderate pain.   methocarbamol 750 MG tablet Commonly known as: ROBAXIN Take 750 mg by mouth 3 (three) times daily as needed.       Vitals:   07/22/19 2118 07/23/19 0454  BP: (!) 93/56 108/71  Pulse: (!) 106 89  Resp: 20 20  Temp: 98 F (36.7 C) (!) 97.5 F (36.4 C)  SpO2: 96% 97%    Dustie Brittle Etheleen Mayhew

## 2019-07-23 NOTE — Discharge Instructions (Signed)
  Diet: Resume home heart healthy regular diet.   Activity: Increase activity as tolerated. Light activity and walking are encouraged. Do not drive or drink alcohol if taking narcotic pain medications.  Wound care: May shower with soapy water and pat dry (do not rub incisions), but no baths or submerging incision underwater until follow-up. (no swimming)   Medications: Resume all home medications. For mild to moderate pain: acetaminophen (Tylenol) or ibuprofen (if no kidney disease). Combining Tylenol with alcohol can substantially increase your risk of causing liver disease. Narcotic pain medications, if prescribed, can be used for severe pain, though may cause nausea, constipation, and drowsiness. Do not combine Tylenol and Norco within a 6 hour period as Norco contains Tylenol. If you do not need the narcotic pain medication, you do not need to fill the prescription.  Call office ([phone]) at any time if any questions, worsening pain, fevers/chills, bleeding, drainage from incision site, or other concerns.  

## 2019-07-25 LAB — SURGICAL PATHOLOGY

## 2019-07-28 ENCOUNTER — Emergency Department
Admission: EM | Admit: 2019-07-28 | Discharge: 2019-07-28 | Disposition: A | Discharge status: Home / Self Care | Payer: Medicaid Other | Attending: Emergency Medicine | Admitting: Emergency Medicine

## 2019-07-28 ENCOUNTER — Other Ambulatory Visit: Payer: Self-pay

## 2019-07-28 ENCOUNTER — Encounter: Payer: Self-pay | Admitting: Emergency Medicine

## 2019-07-28 ENCOUNTER — Telehealth: Payer: Self-pay

## 2019-07-28 DIAGNOSIS — T7840XA Allergy, unspecified, initial encounter: Secondary | ICD-10-CM | POA: Diagnosis not present

## 2019-07-28 DIAGNOSIS — R21 Rash and other nonspecific skin eruption: Secondary | ICD-10-CM | POA: Diagnosis present

## 2019-07-28 DIAGNOSIS — J45909 Unspecified asthma, uncomplicated: Secondary | ICD-10-CM | POA: Insufficient documentation

## 2019-07-28 DIAGNOSIS — B20 Human immunodeficiency virus [HIV] disease: Secondary | ICD-10-CM | POA: Insufficient documentation

## 2019-07-28 MED ORDER — PREDNISONE 10 MG PO TABS: ORAL_TABLET | ORAL | 0 refills | Status: DC

## 2019-07-28 MED ORDER — METOCLOPRAMIDE HCL 5 MG/ML IJ SOLN
10.0000 mg | Freq: Once | INTRAMUSCULAR | Status: AC
  Administered 2019-07-28: 08:00:00 10 mg via INTRAVENOUS
  Filled 2019-07-28: qty 2

## 2019-07-28 MED ORDER — FAMOTIDINE 20 MG PO TABS: 20.0000 mg | ORAL_TABLET | Freq: Two times a day (BID) | ORAL | 0 refills | Status: DC

## 2019-07-28 MED ORDER — DIPHENHYDRAMINE HCL 50 MG/ML IJ SOLN
25.0000 mg | Freq: Once | INTRAMUSCULAR | Status: AC
  Administered 2019-07-28: 25 mg via INTRAVENOUS
  Filled 2019-07-28: qty 1

## 2019-07-28 MED ORDER — DEXAMETHASONE SODIUM PHOSPHATE 10 MG/ML IJ SOLN
10.0000 mg | Freq: Once | INTRAMUSCULAR | Status: AC
  Administered 2019-07-28: 08:00:00 10 mg via INTRAVENOUS
  Filled 2019-07-28: qty 1

## 2019-07-28 NOTE — ED Provider Notes (Signed)
[facility] Emergency Department Provider Note  ____________________________________________   First MD Initiated Contact with Patient 07/28/19 [phone]     (approximate)  I have reviewed the triage vital signs and the nursing notes.   HISTORY  Chief Complaint Allergic Reaction   HPI Erica Walton is a 24 y.o. female resents to the ED with complaint of generalized body rash.  Patient states that she had an appendectomy on 07/22/2019 and was discharged with a prescription for hydrocodone which she has been taking at home for pain.  Patient states that she began itching last evening and took some Benadryl 25 mg without any relief.  She denies any difficulty breathing or swallowing.  She is unaware of any allergies except for penicillin.         Past Medical History:  Diagnosis Date  . Genital herpes   . HIV (human immunodeficiency virus infection) [facility])     Patient Active Problem List   Diagnosis Date Noted  . Acute appendicitis with localized peritonitis 07/22/2019  . SVD (spontaneous vaginal delivery) 12/06/2017  . Lack of food 05/17/2013  . Anemia 11/17/2011  . Asthma 11/17/2011  . Human immunodeficiency virus (HIV) disease (HCC) 11/17/2011    Past Surgical History:  Procedure Laterality Date  . LAPAROSCOPIC APPENDECTOMY N/A 07/22/2019   Procedure: APPENDECTOMY LAPAROSCOPIC;  Surgeon: Carolan Shiver, MD;  Location: [facility] ORS;  Service: General;  Laterality: N/A;    Prior to Admission medications   Medication Sig Start Date End Date Taking? Authorizing Provider  albuterol (PROVENTIL HFA;VENTOLIN HFA) 108 (90 Base) MCG/ACT inhaler Inhale 2 puffs into the lungs every 6 (six) hours as needed for wheezing or shortness of breath. 09/26/17   Enid Derry, PA-C  elvitegravir-cobicistat-emtricitabine-tenofovir (GENVOYA) 150-150-200-10 MG TABS tablet Take 1 tablet by mouth daily with breakfast.  11/01/17   [provider]  famotidine  (PEPCID) 20 MG tablet Take 1 tablet (20 mg total) by mouth 2 (two) times daily. 07/28/19 07/27/20  Tommi Rumps, PA-C  predniSONE (DELTASONE) 10 MG tablet Take 6 tablets  today, on day 2 take 5 tablets, day 3 take 4 tablets, day 4 take 3 tablets, day 5 take  2 tablets and 1 tablet the last day 07/28/19   Tommi Rumps, PA-C    Allergies Penicillins  Family History  Problem Relation Age of Onset  . Cancer Father   . Breast cancer Other     Social History Social History   Tobacco Use  . Smoking status: Never Smoker  . Smokeless tobacco: Never Used  Substance Use Topics  . Alcohol use: No  . Drug use: Not Currently    Review of Systems Constitutional: No fever/chills Eyes: No visual changes. ENT: No sore throat.  Denies difficulty swallowing. Cardiovascular: Denies chest pain. Respiratory: Denies shortness of breath. Gastrointestinal: No abdominal pain.  No nausea, no vomiting.  No diarrhea.  No constipation. Genitourinary: Negative for dysuria. Musculoskeletal: Negative for back pain. Skin: Positive for rash.  Positive itching. Neurological: Negative for headaches, focal weakness or numbness. Immunological: HIV positive ____________________________________________   PHYSICAL EXAM:  VITAL SIGNS: ED Triage Vitals  Enc Vitals Group     BP 07/28/19 0502 128/63     Pulse Rate 07/28/19 0502 98     Resp 07/28/19 0502 18     Temp 07/28/19 0502 98.6 F (37 C)     Temp Source 07/28/19 0502 Oral     SpO2 07/28/19 0502 100 %     Weight 07/28/19  0503 161 lb (73 kg)     Height 07/28/19 0503 4\' 10"  (1.473 m)     Head Circumference --      Peak Flow --      Pain Score 07/28/19 0502 0     Pain Loc --      Pain Edu? --      Excl. in [place]? --     Constitutional: Alert and oriented. Well appearing and in no acute distress. Eyes: Conjunctivae are normal.  Head: Atraumatic. Nose: No congestion/rhinnorhea. Mouth/Throat: Mucous membranes are moist.  Oropharynx  non-erythematous.  No edema noted posterior pharynx and uvula is midline.  Patient is maintaining secretions without any difficulty and no difficulty with talking. Neck: No stridor.   Cardiovascular: Normal rate, regular rhythm. Grossly normal heart sounds.  Good peripheral circulation. Respiratory: Normal respiratory effort.  No retractions. Lungs CTAB. Gastrointestinal: Soft and nontender. No distention. Musculoskeletal: Moves upper and lower extremities with any difficulty.  Normal gait was noted. Neurologic:  Normal speech and language. No gross focal neurologic deficits are appreciated. No gait instability. Skin:  Skin is warm, dry and intact.  There is an erythematous irregular rash noted on the extremities and torso.  No vesicles or pustules were noted.  Areas are flat and nontender. Psychiatric: Mood and affect are normal. Speech and behavior are normal.  ____________________________________________   LABS (all labs ordered are listed, but only abnormal results are displayed)  Labs Reviewed - No data to display  PROCEDURES  Procedure(s) performed (including Critical Care):  Procedures   ____________________________________________   INITIAL IMPRESSION / ASSESSMENT AND PLAN / ED COURSE  As part of my medical decision making, I reviewed the following data within the electronic MEDICAL RECORD NUMBER Notes from prior ED visits and [place] Controlled Substance Database  ----------------------------------------- 9:15 AM on 07/28/2019 ----------------------------------------- Patient was given Decadron 10 mg IV along with Benadryl and Reglan and itching improved.  There was no continued spreading of her rash and patient feels like the rash has improved.  We discussed continuing the prednisone taper along with Pepcid and continued Benadryl at home in which she was made aware that she can take 1 or 2 every 6 hours if needed for itching.  She is return to the emergency department if any severe  worsening of her rash.  ____________________________________________   FINAL CLINICAL IMPRESSION(S) / ED DIAGNOSES  Final diagnoses:  Allergic reaction to drug, initial encounter     ED Discharge Orders         Ordered    predniSONE (DELTASONE) 10 MG tablet     07/28/19 0917    famotidine (PEPCID) 20 MG tablet  2 times daily     07/28/19 7673           Note:  This document was prepared using Dragon voice recognition software and may include unintentional dictation errors.    Johnn Hai, PA-C 07/28/19 4193    Delman Kitten, MD 07/28/19 [phone]

## 2019-07-28 NOTE — Telephone Encounter (Signed)
COVID-19 Pre-Screening Questions:07/28/19  Do you currently have a fever (>100 F), chills or unexplained body aches?NO   Are you currently experiencing new cough, shortness of breath, sore throat, runny nose?NO  .  Have you recently travelled outside the state of [place] in the last 14 days? NO  .  Have you been in contact with someone that is currently pending confirmation of Covid19 testing or has been confirmed to have the Covid19 virus? NO  **If the patient answers NO to ALL questions -  advise the patient to please call the clinic before coming to the office should any symptoms develop.     

## 2019-07-28 NOTE — ED Notes (Signed)
See triage note.  Presents with generalized body rash   States she had surgery last weeks  Placed on Norco  States rash started after she stopped take  Positive itching  No resp issues

## 2019-07-28 NOTE — Discharge Instructions (Signed)
Follow-up with your primary care provider if any continued problems or concerns.  Return to the emergency department immediately if any worsening of your rash.  Begin taking prednisone tapering dose beginning today.  Benadryl over-the-counter is 1 or 2 tablets every 6 hours as needed for itching.  Pepcid is 1 tablet twice a day.  This medication is an H2 blocker and should help with the itching as well.  This medication does not cause drowsiness like the Benadryl does.  Hydrocodone is suspicious for the cause of this rash.

## 2019-07-28 NOTE — ED Triage Notes (Signed)
Patient ambulatory to triage with steady gait, without difficulty or distress noted, mask in place; pt reports generalized itching & rash; st was taking hydrocodone but stopped it 2 days ago; 25mg  benadryl taking at 1130pm

## 2019-07-31 ENCOUNTER — Telehealth: Payer: Self-pay | Admitting: Pharmacy Technician

## 2019-07-31 ENCOUNTER — Encounter: Payer: Self-pay | Admitting: Infectious Diseases

## 2019-07-31 ENCOUNTER — Ambulatory Visit (INDEPENDENT_AMBULATORY_CARE_PROVIDER_SITE_OTHER): Payer: Medicaid Other | Admitting: Infectious Diseases

## 2019-07-31 ENCOUNTER — Other Ambulatory Visit: Payer: Self-pay

## 2019-07-31 ENCOUNTER — Ambulatory Visit: Payer: Medicaid Other | Admitting: Pharmacist

## 2019-07-31 DIAGNOSIS — B20 Human immunodeficiency virus [HIV] disease: Secondary | ICD-10-CM

## 2019-07-31 DIAGNOSIS — K353 Acute appendicitis with localized peritonitis, without perforation or gangrene: Secondary | ICD-10-CM

## 2019-07-31 DIAGNOSIS — L509 Urticaria, unspecified: Secondary | ICD-10-CM | POA: Diagnosis not present

## 2019-07-31 MED ORDER — RALTEGRAVIR POTASSIUM 400 MG PO TABS: 400.0000 mg | ORAL_TABLET | Freq: Two times a day (BID) | ORAL | 5 refills | Status: DC

## 2019-07-31 MED ORDER — EMTRICITABINE-TENOFOVIR DF 200-300 MG PO TABS: 1.0000 | ORAL_TABLET | Freq: Every day | ORAL | 5 refills | Status: DC

## 2019-07-31 NOTE — Telephone Encounter (Signed)
[facility] Patient Advocate Encounter  Insurance verification completed.    The patient is insured through [place] [employer]ICA[employer] and has a $3 copay.    Aurora Rody E. Wilena Tyndall, CPhT [facility] Patient Adv[facility] Phone: [phone] Fax:  [phone]   

## 2019-07-31 NOTE — Patient Instructions (Addendum)
Very nice to meet you and welcome to our clinic!  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  START Isentress twice a day (morning and evening) PLUS Truvada once a day.   Will simplify this as you achieve pregnancy and get into your second trimester.   Please start taking a prenatal vitamin between doses of your Isentress.   Will plan to have you back in 3-4 months again. Call us if you get pregnant sooner!  Ovulation kits would be very helpful also - start testing 12-14 days following your most recent period day 1. Typically ovulation occurs 14-16 days after for women with regular cycles.   Please sign up with MyChart to access your labs and set up email communication with our clinic for non-urgent medical concerns.

## 2019-07-31 NOTE — Progress Notes (Signed)
Name: Erica Walton  DOB: [birth year] MRN: [id number] PCP: Patient, No Pcp Per    Patient Active Problem List   Diagnosis Date Noted  . Urticarial rash 08/04/2019  . Acute appendicitis with localized peritonitis 07/22/2019  . Lack of food 05/17/2013  . Anemia 11/17/2011  . Asthma 11/17/2011  . Human immunodeficiency virus (HIV) disease ([place]) 11/17/2011     Brief Narrative:  Erica Walton is a 24 y.o. female with well controlled HIV, Dx 2013 with routine pregnancy testing. Has always been on treatment and previously in care through [facility].  CD4 nadir 300  HIV Risk: heterosexual  History of OIs: none known  Intake Labs 06/2019: Hep B sAg (-), sAb (+), cAb (-); Hep A (-), Hep C (-) Quantiferon (-) HLA B*5701 (-) G6PD: ()   Previous Regimens: . Truvada + Prezista/Ritonovir . Stribild . Genvoya --> undetectable    Genotypes: . 2013 - K103N    Subjective:   Chief Complaint  Patient presents with  . Follow-up       HPI: Erica Walton is a 24 y.o. female new to our clinic, transferring HIV care. Had surgery recently to remove appendix - had an allergic reaction to Norco where she had a pruritic drug rash after a few doses of medications. Much better now after taking Benadryl and Prednisone.   LMP Feb 5th and March 28th. Generally regular but has been "out of whack" in the past with other methods of contraception. She recently used the Nexplanon but had this removed due to prolonged heavy and constant bleeding.  She has an OBGYN locally she is in care with.   Considering the COVID vaccine but not sure what she will decide yet. She is married and sexually active only with her husband. He would like to take PrEP while they plan to conceive for maximal protection; previously was unable to afford but now has access to care.   Had a pap smear last year during pregnancy and reported normal results. Does recall some abnormal tests but they cleared without  intervention. No history of colposcopy. No family history of breast, ovarian or uterine cancer.   Currently not working due to [employer] pandemic but previously worked at [employer].   Review of Systems  Constitutional: Negative for chills, fever, malaise/fatigue and weight loss.  HENT: Negative for sore throat.   Respiratory: Negative for cough, sputum production and shortness of breath.   Cardiovascular: Negative.   Gastrointestinal: Positive for constipation. Negative for abdominal pain, diarrhea and vomiting.  Musculoskeletal: Negative for joint pain, myalgias and neck pain.  Skin: Positive for itching and rash.  Neurological: Negative for headaches.  Psychiatric/Behavioral: Negative for depression and substance abuse. The patient is not nervous/anxious.     Past Medical History:  Diagnosis Date  . Genital herpes   . HIV (human immunodeficiency virus infection) ([place])     Outpatient Medications Prior to Visit  Medication Sig Dispense Refill  . albuterol (PROVENTIL HFA;VENTOLIN HFA) 108 (90 Base) MCG/ACT inhaler Inhale 2 puffs into the lungs every 6 (six) hours as needed for wheezing or shortness of breath. 1 Inhaler 0  . diphenhydrAMINE (BENADRYL) 25 MG tablet Take 25 mg by mouth every 6 (six) hours as needed.    . famotidine (PEPCID) 20 MG tablet Take 1 tablet (20 mg total) by mouth 2 (two) times daily. 30 tablet 0  . predniSONE (DELTASONE) 10 MG tablet Take 6 tablets  today, on day 2 take 5 tablets, day  3 take 4 tablets, day 4 take 3 tablets, day 5 take  2 tablets and 1 tablet the last day 21 tablet 0  . elvitegravir-cobicistat-emtricitabine-tenofovir (GENVOYA) 150-150-200-10 MG TABS tablet Take 1 tablet by mouth daily with breakfast.      No facility-administered medications prior to visit.     Allergies  Allergen Reactions  . Norco [Hydrocodone-Acetaminophen] Rash  . Penicillins Rash and Other (See Comments)    Has patient had a PCN reaction causing immediate rash,  facial/tongue/throat swelling, SOB or lightheadedness with hypotension: yes, [id number]} Has patient had a PCN reaction causing severe rash involving mucus membranes or skin necrosis: no:[id number]} Has patient had a PCN reaction that required hospitalization no:[id number]} Has patient had a PCN reaction occurring within the last 10 years: no:[id number]} If all of the above answers are "NO", then may proceed with Cephalosporin use.     Social History   Tobacco Use  . Smoking status: Never Smoker  . Smokeless tobacco: Never Used  Substance Use Topics  . Alcohol use: No  . Drug use: Not Currently    Family History  Problem Relation Age of Onset  . Cancer Father   . Breast cancer Other     Social History   Substance and Sexual Activity  Sexual Activity Yes  . Birth control/protection: Implant     Objective:   Vitals:   07/31/19 1402  BP: 104/71  Pulse: (!) 101  Temp: 98.6 F (37 C)  TempSrc: Oral  Weight: 161 lb 6.4 oz (73.2 kg)   Body mass index is 33.73 kg/m.  Physical Exam Constitutional:      Appearance: She is well-developed.     Comments: Seated comfortably in chair.   HENT:     Mouth/Throat:     Mouth: No oral lesions.     Dentition: Normal dentition. No dental abscesses.     Pharynx: No oropharyngeal exudate.  Cardiovascular:     Rate and Rhythm: Normal rate and regular rhythm.     Heart sounds: Normal heart sounds.  Pulmonary:     Effort: Pulmonary effort is normal.     Breath sounds: Normal breath sounds.  Abdominal:     General: There is no distension.     Palpations: Abdomen is soft.     Tenderness: There is no abdominal tenderness.  Musculoskeletal:        General: No tenderness. Normal range of motion.  Lymphadenopathy:     Cervical: No cervical adenopathy.  Skin:    General: Skin is warm and dry.     Findings: No rash.     Comments: Urticarial papular rash diffusely over arm and stomach; some patches are still raised.  Keyhole  incisions clean and dry from recent surgery to umbilicus, lower abdomen and left lateral abdomen.   Neurological:     Mental Status: She is alert and oriented to person, place, and time.  Psychiatric:        Judgment: Judgment normal.     Comments: In good spirits today and engaged in care discussion     Lab Results Lab Results  Component Value Date   WBC 9.3 07/21/2019   HGB 12.9 07/21/2019   HCT 39.6 07/21/2019   MCV 80.8 07/21/2019   PLT 329 07/21/2019    Lab Results  Component Value Date   CREATININE 0.56 07/21/2019   BUN 8 07/21/2019   NA 139 07/21/2019   K 4.2 07/21/2019   CL 106 07/21/2019   CO2 25 07/21/2019  Lab Results  Component Value Date   ALT 15 07/21/2019   AST 19 07/21/2019   ALKPHOS 101 07/21/2019   BILITOT 0.5 07/21/2019    Lab Results  Component Value Date   CHOL 186 07/17/2019   HDL 42 (L) 07/17/2019   LDLCALC 112 (H) 07/17/2019   TRIG 207 (H) 07/17/2019   CHOLHDL 4.4 07/17/2019   HIV 1 RNA Quant (copies/mL)  Date Value  07/17/2019 <20 NOT DETECTED   CD4 T Cell Abs (/uL)  Date Value  07/17/2019 801     Assessment & Plan:   Problem List Items Addressed This Visit      Unprioritized   Urticarial rash    Continue measures and interventions with prednisone and benadryl as outlined by outside provider. Slow to fade.       Human immunodeficiency virus (HIV) disease ([place])    New patient here to transfer for HIV care.  Previous regimens as described above but desires pregnancy currently - will switch to Isentress BID and Truvada for pregnancy safe option. She can have simplified regimen in 2nd trimester with Tivicay + Truvada.  I discussed with Tania Ade Dollison treatment options/side effects, benefits of treatment and long-term outcomes. I discussed how HIV is transmitted and the process of untreated HIV including increased risk for opportunistic infections, cancer, dementia and renal failure. Patient was counseled on routine HIV care  including medication adherence, blood monitoring, necessary vaccines and follow up visits. Counseled regarding safe sex practices including: condom use, partner disclosure, limiting partners.   Medicaid active and no access problems for meds.    General introduction to our clinic and integrated services.  Dental referral placed today for [facility]. Information to schedule appointment completed today.   I spent greater than 30 minutes with the patient today. Greater than 50% of the time spent face-to-face counseling and coordination of care re: HIV and health maintenance.        Relevant Medications   raltegravir (ISENTRESS) 400 MG tablet   emtricitabine-tenofovir (TRUVADA) 200-300 MG tablet   Acute appendicitis with localized peritonitis    Recovering well from surgery - incisions are clean and still with dermabond.          Janene Madeira, MSN, NP-C [facility] for Infectious [place] Pager: [phone] Office: [phone]  08/04/19  3:08 PM

## 2019-08-04 DIAGNOSIS — L509 Urticaria, unspecified: Secondary | ICD-10-CM | POA: Insufficient documentation

## 2019-08-04 NOTE — Assessment & Plan Note (Signed)
New patient here to transfer for HIV care.  Previous regimens as described above but desires pregnancy currently - will switch to Isentress BID and Truvada for pregnancy safe option. She can have simplified regimen in 2nd trimester with Tivicay + Truvada.  I discussed with [name] treatment options/side effects, benefits of treatment and long-term outcomes. I discussed how HIV is transmitted and the process of untreated HIV including increased risk for opportunistic infections, cancer, dementia and renal failure. Patient was counseled on routine HIV care including medication adherence, blood monitoring, necessary vaccines and follow up visits. Counseled regarding safe sex practices including: condom use, partner disclosure, limiting partners.   Medicaid active and no access problems for meds.    General introduction to our clinic and integrated services.  Dental referral placed today for [facility]. Information to schedule appointment completed today.   I spent greater than 30 minutes with the patient today. Greater than 50% of the time spent face-to-face counseling and coordination of care re: HIV and health maintenance.

## 2019-08-04 NOTE — Assessment & Plan Note (Signed)
Continue measures and interventions with prednisone and benadryl as outlined by outside provider. Slow to fade.

## 2019-08-04 NOTE — Assessment & Plan Note (Signed)
Recovering well from surgery - incisions are clean and still with dermabond.

## 2019-08-09 ENCOUNTER — Other Ambulatory Visit: Payer: Medicaid Other

## 2019-09-06 ENCOUNTER — Other Ambulatory Visit: Payer: Self-pay

## 2019-09-06 ENCOUNTER — Ambulatory Visit (INDEPENDENT_AMBULATORY_CARE_PROVIDER_SITE_OTHER): Payer: Medicaid Other | Admitting: Family Medicine

## 2019-09-06 ENCOUNTER — Other Ambulatory Visit (HOSPITAL_COMMUNITY)
Admission: RE | Admit: 2019-09-06 | Discharge: 2019-09-06 | Disposition: A | Discharge status: Home / Self Care | Payer: Medicaid Other | Source: Ambulatory Visit | Attending: Family Medicine | Admitting: Family Medicine

## 2019-09-06 ENCOUNTER — Encounter: Payer: Self-pay | Admitting: Family Medicine

## 2019-09-06 VITALS — BP 121/84 | HR 106 | Wt 158.0 lb

## 2019-09-06 DIAGNOSIS — O0991 Supervision of high risk pregnancy, unspecified, first trimester: Secondary | ICD-10-CM | POA: Diagnosis not present

## 2019-09-06 DIAGNOSIS — Z3A08 8 weeks gestation of pregnancy: Secondary | ICD-10-CM

## 2019-09-06 DIAGNOSIS — O0990 Supervision of high risk pregnancy, unspecified, unspecified trimester: Secondary | ICD-10-CM | POA: Insufficient documentation

## 2019-09-06 DIAGNOSIS — E669 Obesity, unspecified: Secondary | ICD-10-CM | POA: Diagnosis not present

## 2019-09-06 DIAGNOSIS — O09891 Supervision of other high risk pregnancies, first trimester: Secondary | ICD-10-CM | POA: Diagnosis not present

## 2019-09-06 DIAGNOSIS — O09291 Supervision of pregnancy with other poor reproductive or obstetric history, first trimester: Secondary | ICD-10-CM

## 2019-09-06 DIAGNOSIS — O99210 Obesity complicating pregnancy, unspecified trimester: Secondary | ICD-10-CM | POA: Insufficient documentation

## 2019-09-06 DIAGNOSIS — O99211 Obesity complicating pregnancy, first trimester: Secondary | ICD-10-CM

## 2019-09-06 DIAGNOSIS — O09299 Supervision of pregnancy with other poor reproductive or obstetric history, unspecified trimester: Secondary | ICD-10-CM

## 2019-09-06 DIAGNOSIS — O98719 Human immunodeficiency virus [HIV] disease complicating pregnancy, unspecified trimester: Secondary | ICD-10-CM | POA: Insufficient documentation

## 2019-09-06 MED ORDER — ASPIRIN EC 81 MG PO TBEC: 81.0000 mg | DELAYED_RELEASE_TABLET | Freq: Every day | ORAL | 2 refills | Status: DC

## 2019-09-06 NOTE — Patient Instructions (Signed)

## 2019-09-06 NOTE — Progress Notes (Signed)
INITIAL PRENATAL VISIT  Subjective:   Erica Walton is a [id number] being seen today for her first obstetrical visit.  This is a planned pregnancy. This is a desired pregnancy.  She is at [redacted]w[redacted]d gestation by [place] today Her obstetrical history is significant for HIV positive. Relationship with FOB: spouse, living together. Patient wants to BF but has not in the past due to HIV status. Plans to discuss with pediatric provider. Pregnancy history fully reviewed.  Patient reports no complaints.  Indications for ASA therapy (per uptodate) One of the following: Previous pregnancy with preeclampsia, especially early onset and with an adverse outcome No Multifetal gestation No Chronic hypertension No Type 1 or 2 diabetes mellitus No Chronic kidney disease No Autoimmune disease (antiphospholipid syndrome, systemic lupus erythematosus) No  Two or more of the following: Nulliparity No Obesity (body mass index >30 kg/m2) Yes Family history of preeclampsia in mother or sister No Age ?35 years No Sociodemographic characteristics (African American race, low socioeconomic level) No Personal risk factors (eg, previous pregnancy with low birth weight or small for gestational age infant, previous adverse pregnancy outcome [eg, stillbirth], interval >10 years between pregnancies) Yes  Indications for early GDM screening  First-degree relative with diabetes Yes- maternal grandmother, father BMI >30kg/m2 Yes Age > 25 Yes Previous birth of an infant weighing ?4000 g No Gestational diabetes mellitus in a previous pregnancy No Glycated hemoglobin ?5.7 percent (39 mmol/mol), impaired glucose tolerance, or impaired fasting glucose on previous testing No High-risk race/ethnicity (eg, African American, Latino, Native American, Asian American, Pacific Islander) No Previous stillbirth of unknown cause No Maternal birthweight > 9 lbs No History of cardiovascular disease No Hypertension or on therapy for  hypertension No High-density lipoprotein cholesterol level <35 mg/dL (0.90 mmol/L) and/or a triglyceride level >250 mg/dL (2.82 mmol/L) No Polycystic ovary syndrome No Physical inactivity No Other clinical condition associated with insulin resistance (eg, severe obesity, acanthosis nigricans) No Current use of glucocorticoids No   Early screening tests: FBS, A1C, Random CBG, glucose challenge   Review of Systems:   Review of Systems  Objective:    Obstetric History OB History  Gravida Para Term Preterm AB Living  5 4 4     4   SAB TAB Ectopic Multiple Live Births          4    # Outcome Date GA Lbr Len/2nd Weight Sex Delivery Anes PTL Lv  5 Current           4 Term 11/05/18    F Vag-Spont   LIV  3 Term 12/06/17    F Vag-Spont   LIV  2 Term 05/31/13    F Vag-Spont   LIV  1 Term 04/04/12    F Vag-Spont   LIV    Past Medical History:  Diagnosis Date  . Genital herpes   . HIV (human immunodeficiency virus infection) ([place])     Past Surgical History:  Procedure Laterality Date  . LAPAROSCOPIC APPENDECTOMY N/A 07/22/2019   Procedure: APPENDECTOMY LAPAROSCOPIC;  Surgeon: Herbert Pun, MD;  Location: [facility] ORS;  Service: General;  Laterality: N/A;    Current Outpatient Medications on File Prior to Visit  Medication Sig Dispense Refill  . albuterol (PROVENTIL HFA;VENTOLIN HFA) 108 (90 Base) MCG/ACT inhaler Inhale 2 puffs into the lungs every 6 (six) hours as needed for wheezing or shortness of breath. 1 Inhaler 0  . emtricitabine-tenofovir (TRUVADA) 200-300 MG tablet Take 1 tablet by mouth daily. 30 tablet 5  . Prenatal  Vit-Fe Fumarate-FA (MULTIVITAMIN-PRENATAL) 27-0.8 MG TABS tablet Take 1 tablet by mouth daily at 12 noon.    . raltegravir (ISENTRESS) 400 MG tablet Take 1 tablet (400 mg total) by mouth 2 (two) times daily. 60 tablet 5  . diphenhydrAMINE (BENADRYL) 25 MG tablet Take 25 mg by mouth every 6 (six) hours as needed.    . famotidine (PEPCID) 20 MG tablet Take  1 tablet (20 mg total) by mouth 2 (two) times daily. 30 tablet 0  . predniSONE (DELTASONE) 10 MG tablet Take 6 tablets  today, on day 2 take 5 tablets, day 3 take 4 tablets, day 4 take 3 tablets, day 5 take  2 tablets and 1 tablet the last day 21 tablet 0   No current facility-administered medications on file prior to visit.    Allergies  Allergen Reactions  . Norco [Hydrocodone-Acetaminophen] Rash  . Penicillins Rash and Other (See Comments)    Has patient had a PCN reaction causing immediate rash, facial/tongue/throat swelling, SOB or lightheadedness with hypotension: yes, [id number]} Has patient had a PCN reaction causing severe rash involving mucus membranes or skin necrosis: no:[id number]} Has patient had a PCN reaction that required hospitalization no:[id number]} Has patient had a PCN reaction occurring within the last 10 years: no:[id number]} If all of the above answers are "NO", then may proceed with Cephalosporin use.     Social History:  reports that she has never smoked. She has never used smokeless tobacco. She reports previous drug use. She reports that she does not drink alcohol.  Family History  Problem Relation Age of Onset  . Cancer Father   . Breast cancer Other     The following portions of the patient's history were reviewed and updated as appropriate: allergies, current medications, past family history, past medical history, past social history, past surgical history and problem list.  Review of Systems Review of Systems  Constitutional: Negative for chills and fever.  HENT: Negative for congestion and sore throat.   Eyes: Negative for pain and visual disturbance.  Respiratory: Negative for cough, chest tightness and shortness of breath.   Cardiovascular: Negative for chest pain.  Gastrointestinal: Negative for abdominal pain, diarrhea, nausea and vomiting.  Endocrine: Negative for cold intolerance and heat intolerance.  Genitourinary: Negative for dysuria and  flank pain.  Musculoskeletal: Negative for back pain.  Skin: Negative for rash.  Allergic/Immunologic: Negative for food allergies.  Neurological: Negative for dizziness and light-headedness.  Psychiatric/Behavioral: Negative for agitation.      Physical Exam:  BP 121/84   Pulse (!) 106   Wt 158 lb (71.7 kg)   LMP 06/18/2019 (Exact Date) Comment: neg. preg test  BMI 33.02 kg/m  CONSTITUTIONAL: Well-developed, well-nourished female in no acute distress.  HENT:  Normocephalic, atraumatic, External right and left ear normal. Oropharynx is clear and moist EYES: Conjunctivae normal. No scleral icterus.  NECK: Normal range of motion, supple, no masses.  Normal thyroid.  SKIN: Skin is warm and dry. No rash noted. Not diaphoretic. No erythema. No pallor. MUSCULOSKELETAL: Normal range of motion. No tenderness.  No cyanosis, clubbing, or edema.   NEUROLOGIC: Alert and oriented to person, place, and time. Normal muscle tone coordination.  PSYCHIATRIC: Normal mood and affect. Normal behavior. Normal judgment and thought content. CARDIOVASCULAR: Normal heart rate noted, regular rhythm RESPIRATORY: Clear to auscultation bilaterally. Effort and breath sounds normal, no problems with respiration noted. BREASTS: Symmetric in size. No masses, skin changes, nipple drainage, or lymphadenopathy. ABDOMEN: Soft, normal bowel sounds, no distention  noted.  No tenderness, rebound or guarding. Fundal ht: below pelvis PELVIC:deferred FHR: 173   Assessment:    Pregnancy: G5P4004  1. Supervision of high risk pregnancy, antepartum Initial labs drawn. Prenatal vitamins. Problem list reviewed and updated. Reviewed in detail the nature of the practice with collaborative care between  Genetic screening discussed: NIPS- requested, desires carrier screening at the same time as NIP Role of ultrasound in pregnancy discussed; Anatomy US: requested. Amniocentesis discussed: not indicated. Follow up in 4  weeks. Discussed clinic routines, schedule of care and testing, genetic screening options, involvement of students and residents under the direct supervision of APPs and doctors and presence of female providers. Pt verbalized understanding.  - Obstetric Panel, Including HIV - Culture, OB Urine - Hepatitis C antibody - GC/Chlamydia probe amp ()not at [facility] - [place] MFM OB DETAIL +14 WK; Future  2. History of IUGR (intrauterine growth retardation) and stillbirth, currently pregnant G1, all others AGA  3. HIV risk factors affecting pregnancy in first trimester On therapy, VL non-detected Reviewed importance of infectious disease visits PLan for [place] at 20-28-36 wks and IOL at 39 if VL remains low  4. Obesity affecting pregnancy in first trimester TWG goal 11-15 Reviewed recommendation to start ASA given 2 minor risk factors (obesity and prior FGR). Accept and will start at 12 wks.  Reviewed OTC 81mg  ASA.   , MD 09/06/2019 3:47 PM

## 2019-09-06 NOTE — Progress Notes (Signed)
Had IUGR with first child  Last pap 2019 at [facility]  Delivered at [facility] AND VIABILITY SONOGRAM   Erica Walton is a 24 y.o. year old G57P4004 with LMP Patient's last menstrual period was 06/18/2019 (exact date). which does not  correlate to  [redacted]w[redacted]d weeks gestation.  She has irregular menstrual cycles.   She is here today for a confirmatory initial sonogram.    GESTATION: SINGLETON yes  FETAL ACTIVITY:          Heart rate    173          The fetus is current.   GESTATIONAL AGE AND  BIOMETRICS:  Gestational criteria: Estimated Date of Delivery: 04/13/20 by early ultrasound now at [redacted]w[redacted]d  Previous Scans:0  GESTATIONAL SAC            mm         weeks  CROWN RUMP LENGTH           2.04 mm         8.4 weeks                                                   AVERAGE EGA(BY THIS SCAN):  8.4 weeks  WORKING EDD( early ultrasound ):  04/13/2020     TECHNICIAN COMMENTS:  Patient informed that the ultrasound is considered a limited obstetric ultrasound and is not intended to be a complete ultrasound exam. Patient also informed that the ultrasound is not being completed with the intent of assessing for fetal or placental anomalies or any pelvic abnormalities. Explained that the purpose of today's ultrasound is to assess for fetal heart rate. Patient acknowledges the purpose of the exam and the limitations of the study.       A copy of this report including all images has been saved and backed up to a second source for retrieval if needed. All measures and details of the anatomical scan, placentation, fluid volume and pelvic anatomy are contained in that report.  Scheryl Marten 09/06/2019 3:46 PM

## 2019-09-07 LAB — OBSTETRIC PANEL, INCLUDING HIV
Antibody Screen: NEGATIVE
Basophils Absolute: 0 10*3/uL (ref 0.0–0.2)
Basos: 0 %
EOS (ABSOLUTE): 0.3 10*3/uL (ref 0.0–0.4)
Eos: 3 %
HIV Screen 4th Generation wRfx: REACTIVE — AB
Hematocrit: 41.8 % (ref 34.0–46.6)
Hemoglobin: 14 g/dL (ref 11.1–15.9)
Hepatitis B Surface Ag: NEGATIVE
Immature Grans (Abs): 0 10*3/uL (ref 0.0–0.1)
Immature Granulocytes: 0 %
Lymphocytes Absolute: 2.7 10*3/uL (ref 0.7–3.1)
Lymphs: 25 %
MCH: 26.9 pg (ref 26.6–33.0)
MCHC: 33.5 g/dL (ref 31.5–35.7)
MCV: 80 fL (ref 79–97)
Monocytes Absolute: 0.7 10*3/uL (ref 0.1–0.9)
Monocytes: 7 %
Neutrophils Absolute: 7 10*3/uL (ref 1.4–7.0)
Neutrophils: 65 %
Platelets: 414 10*3/uL (ref 150–450)
RBC: 5.2 x10E6/uL (ref 3.77–5.28)
RDW: 14.6 % (ref 11.7–15.4)
RPR Ser Ql: NONREACTIVE
Rh Factor: POSITIVE
Rubella Antibodies, IGG: 6.5 index (ref >0.99)
WBC: 10.7 10*3/uL (ref 3.4–10.8)

## 2019-09-07 LAB — HIV 1/2 AB DIFFERENTIATION
HIV 1 Ab: POSITIVE — AB
HIV 2 Ab: NEGATIVE
NOTE (HIV CONF MULTIP: POSITIVE — AB

## 2019-09-07 LAB — HEPATITIS C ANTIBODY: Hep C Virus Ab: <0.1 s/co ratio (ref 0.0–0.9)

## 2019-09-08 ENCOUNTER — Encounter: Payer: Self-pay | Admitting: Family Medicine

## 2019-09-08 DIAGNOSIS — O0990 Supervision of high risk pregnancy, unspecified, unspecified trimester: Secondary | ICD-10-CM

## 2019-09-08 LAB — GC/CHLAMYDIA PROBE AMP (~~LOC~~) NOT AT ARMC
Chlamydia: NEGATIVE
Comment: NEGATIVE
Comment: NORMAL
Neisseria Gonorrhea: NEGATIVE

## 2019-09-11 ENCOUNTER — Telehealth: Payer: Self-pay | Admitting: *Deleted

## 2019-09-11 ENCOUNTER — Encounter: Payer: Self-pay | Admitting: *Deleted

## 2019-09-11 LAB — URINE CULTURE, OB REFLEX

## 2019-09-11 LAB — CULTURE, OB URINE

## 2019-09-11 NOTE — Telephone Encounter (Signed)
Left message for pt to call us back regarding needing to send another urine specimen.

## 2019-09-14 ENCOUNTER — Ambulatory Visit: Payer: Medicaid Other | Admitting: *Deleted

## 2019-09-14 ENCOUNTER — Other Ambulatory Visit: Payer: Self-pay

## 2019-09-14 DIAGNOSIS — O0990 Supervision of high risk pregnancy, unspecified, unspecified trimester: Secondary | ICD-10-CM

## 2019-09-16 LAB — CULTURE, OB URINE

## 2019-09-16 LAB — URINE CULTURE, OB REFLEX

## 2019-10-04 ENCOUNTER — Other Ambulatory Visit: Payer: Self-pay

## 2019-10-04 ENCOUNTER — Encounter: Payer: Self-pay | Admitting: Family Medicine

## 2019-10-04 ENCOUNTER — Ambulatory Visit (INDEPENDENT_AMBULATORY_CARE_PROVIDER_SITE_OTHER): Payer: Medicaid Other | Admitting: Family Medicine

## 2019-10-04 VITALS — BP 120/76 | HR 80 | Wt 156.0 lb

## 2019-10-04 DIAGNOSIS — O99211 Obesity complicating pregnancy, first trimester: Secondary | ICD-10-CM

## 2019-10-04 DIAGNOSIS — O0992 Supervision of high risk pregnancy, unspecified, second trimester: Secondary | ICD-10-CM

## 2019-10-04 DIAGNOSIS — E669 Obesity, unspecified: Secondary | ICD-10-CM

## 2019-10-04 DIAGNOSIS — O0990 Supervision of high risk pregnancy, unspecified, unspecified trimester: Secondary | ICD-10-CM

## 2019-10-04 DIAGNOSIS — Z3481 Encounter for supervision of other normal pregnancy, first trimester: Secondary | ICD-10-CM | POA: Diagnosis not present

## 2019-10-04 DIAGNOSIS — O09891 Supervision of other high risk pregnancies, first trimester: Secondary | ICD-10-CM

## 2019-10-04 DIAGNOSIS — Z3A12 12 weeks gestation of pregnancy: Secondary | ICD-10-CM

## 2019-10-04 DIAGNOSIS — O09299 Supervision of pregnancy with other poor reproductive or obstetric history, unspecified trimester: Secondary | ICD-10-CM

## 2019-10-04 DIAGNOSIS — Z21 Asymptomatic human immunodeficiency virus [HIV] infection status: Secondary | ICD-10-CM

## 2019-10-04 DIAGNOSIS — O09292 Supervision of pregnancy with other poor reproductive or obstetric history, second trimester: Secondary | ICD-10-CM

## 2019-10-04 DIAGNOSIS — O98711 Human immunodeficiency virus [HIV] disease complicating pregnancy, first trimester: Secondary | ICD-10-CM

## 2019-10-04 MED ORDER — VITAFOL GUMMIES 3.33-0.333-34.8 MG PO CHEW: 1.0000 | CHEWABLE_TABLET | Freq: Every day | ORAL | 12 refills | Status: DC

## 2019-10-04 NOTE — Progress Notes (Signed)
Pt presents for ROB requests rx for PNV gummy

## 2019-10-04 NOTE — Progress Notes (Signed)
   PRENATAL VISIT NOTE  Subjective:  Erica Walton is a 24 y.o. G6K5993 at [redacted]w[redacted]d being seen today for ongoing prenatal care.  She is currently monitored for the following issues for this high-risk pregnancy and has Asthma; Human immunodeficiency virus (HIV) disease (HCC); Acute appendicitis with localized peritonitis; Urticarial rash; Supervision of high risk pregnancy, antepartum; HIV (human immunodeficiency virus) risk factors complicating pregnancy; and Obesity affecting pregnancy on their problem list.  Patient reports no complaints.  Contractions: Not present. Vag. Bleeding: None.   . Denies leaking of fluid.   The following portions of the patient's history were reviewed and updated as appropriate: allergies, current medications, past family history, past medical history, past social history, past surgical history and problem list.   Objective:   Vitals:   10/04/19 1456  BP: 120/76  Pulse: 80  Weight: 156 lb (70.8 kg)    Fetal Status: Fetal Heart Rate (bpm): 160          General:  Alert, oriented and cooperative. Patient is in no acute distress.  Skin: Skin is warm and dry. No rash noted.   Cardiovascular: Normal heart rate noted  Respiratory: Normal respiratory effort, no problems with respiration noted  Abdomen: Soft, gravid, appropriate for gestational age.  Pain/Pressure: Absent     Pelvic: Cervical exam deferred        Extremities: Normal range of motion.  Edema: None  Mental Status: Normal mood and affect. Normal behavior. Normal judgment and thought content.   Assessment and Plan:  Pregnancy: G5P4004 at [redacted]w[redacted]d 1. Supervision of high risk pregnancy, antepartum Up to date currently ANatomy scan scheduled - Genetic Screening - Prenatal Vit-Fe Phos-FA-Omega (VITAFOL GUMMIES) 3.33-0.333-34.8 MG CHEW; Chew 1 tablet by mouth daily.  Dispense: 90 tablet; Refill: 12  2. History of IUGR (intrauterine growth retardation) and stillbirth, currently pregnant Monitor FH and  if lagging, low threshold for EFW  3. HIV risk factors affecting pregnancy in first trimester Stable, continue medication   4. Obesity affecting pregnancy in first trimester TWG= -2 lb (-0.907 kg) Encouraged 11-15 lb total weight gain Reviewed high calorie and high protein snack with goal of 5 meals per day  Preterm labor symptoms and general obstetric precautions including but not limited to vaginal bleeding, contractions, leaking of fluid and fetal movement were reviewed in detail with the patient. Please refer to After Visit Summary for other counseling recommendations.   Return in about 4 weeks (around 11/01/2019) for Routine prenatal care, in person.  Future Appointments  Date Time Provider Department Center  11/22/2019  1:00 PM [facility] NURSE [facility]  11/22/2019  1:00 PM [facility] US1 [facility]  11/23/2019  3:15 PM [facility] LAB [facility]  12/14/2019  3:15 PM Blanchard Kelch, NP [facility]    Federico Flake, MD

## 2019-10-11 ENCOUNTER — Encounter: Payer: Self-pay | Admitting: Radiology

## 2019-10-11 ENCOUNTER — Telehealth: Payer: Self-pay | Admitting: Radiology

## 2019-10-11 NOTE — Telephone Encounter (Signed)
Patient informed of Panorama results and fetal sex 

## 2019-10-12 ENCOUNTER — Encounter: Payer: Self-pay | Admitting: Radiology

## 2019-10-12 ENCOUNTER — Telehealth: Payer: Self-pay | Admitting: Radiology

## 2019-10-12 NOTE — Telephone Encounter (Signed)
Left message for patient with [facility] Screening results

## 2019-11-01 ENCOUNTER — Ambulatory Visit (INDEPENDENT_AMBULATORY_CARE_PROVIDER_SITE_OTHER): Payer: Medicaid Other | Admitting: Family Medicine

## 2019-11-01 ENCOUNTER — Other Ambulatory Visit: Payer: Self-pay

## 2019-11-01 VITALS — BP 105/74 | HR 98 | Wt 155.0 lb

## 2019-11-01 DIAGNOSIS — O0990 Supervision of high risk pregnancy, unspecified, unspecified trimester: Secondary | ICD-10-CM

## 2019-11-01 DIAGNOSIS — O98712 Human immunodeficiency virus [HIV] disease complicating pregnancy, second trimester: Secondary | ICD-10-CM

## 2019-11-01 DIAGNOSIS — Z3A16 16 weeks gestation of pregnancy: Secondary | ICD-10-CM

## 2019-11-01 DIAGNOSIS — B20 Human immunodeficiency virus [HIV] disease: Secondary | ICD-10-CM

## 2019-11-01 NOTE — Patient Instructions (Signed)

## 2019-11-01 NOTE — Progress Notes (Signed)
   PRENATAL VISIT NOTE  Subjective:  Erica Walton is a 24 y.o. [id number] at [redacted]w[redacted]d being seen today for ongoing prenatal care.  She is currently monitored for the following issues for this high-risk pregnancy and has Asthma; Human immunodeficiency virus (HIV) disease (HCC); Acute appendicitis with localized peritonitis; Urticarial rash; Supervision of high risk pregnancy, antepartum; HIV (human immunodeficiency virus) risk factors complicating pregnancy; Obesity affecting pregnancy; and Herpes on their problem list.  Patient reports no complaints.  Contractions: Not present. Vag. Bleeding: None.   . Denies leaking of fluid.   The following portions of the patient's history were reviewed and updated as appropriate: allergies, current medications, past family history, past medical history, past social history, past surgical history and problem list.   Objective:   Vitals:   11/01/19 1444  BP: 105/74  Pulse: 98  Weight: 155 lb (70.3 kg)    Fetal Status: Fetal Heart Rate (bpm): 153         General:  Alert, oriented and cooperative. Patient is in no acute distress.  Skin: Skin is warm and dry. No rash noted.   Cardiovascular: Normal heart rate noted  Respiratory: Normal respiratory effort, no problems with respiration noted  Abdomen: Soft, gravid, appropriate for gestational age.  Pain/Pressure: Absent     Pelvic: Cervical exam deferred        Extremities: Normal range of motion.  Edema: None  Mental Status: Normal mood and affect. Normal behavior. Normal judgment and thought content.   Assessment and Plan:  Pregnancy: G5P4004 at [redacted]w[redacted]d 1. Supervision of high risk pregnancy, antepartum Continue routine prenatal care. Normal NIPT Anatomy u/s scheduled  2. Human immunodeficiency virus (HIV) disease (HCC) On Truvada and Isentress  Preterm labor symptoms and general obstetric precautions including but not limited to vaginal bleeding, contractions, leaking of fluid and fetal movement  were reviewed in detail with the patient. Please refer to After Visit Summary for other counseling recommendations.   Return in 4 weeks (on 11/29/2019).  Future Appointments  Date Time Provider Department Center  11/22/2019  1:00 PM [facility] NURSE [facility]  11/22/2019  1:00 PM [facility] US1 [facility]  11/23/2019  3:15 PM [facility] LAB [facility]  11/29/2019  1:45 PM Federico Flake, MD [facility]  12/14/2019  3:15 PM Durwin Nora, Gomez Cleverly, NP [facility]    Reva Bores, MD

## 2019-11-22 ENCOUNTER — Ambulatory Visit: Payer: Medicaid Other | Attending: Obstetrics and Gynecology

## 2019-11-22 ENCOUNTER — Ambulatory Visit: Payer: Medicaid Other | Admitting: *Deleted

## 2019-11-22 ENCOUNTER — Other Ambulatory Visit: Payer: Self-pay

## 2019-11-22 ENCOUNTER — Other Ambulatory Visit: Payer: Self-pay | Admitting: *Deleted

## 2019-11-22 DIAGNOSIS — O99211 Obesity complicating pregnancy, first trimester: Secondary | ICD-10-CM | POA: Diagnosis not present

## 2019-11-22 DIAGNOSIS — O09891 Supervision of other high risk pregnancies, first trimester: Secondary | ICD-10-CM | POA: Diagnosis not present

## 2019-11-22 DIAGNOSIS — Z363 Encounter for antenatal screening for malformations: Secondary | ICD-10-CM | POA: Diagnosis not present

## 2019-11-22 DIAGNOSIS — B009 Herpesviral infection, unspecified: Secondary | ICD-10-CM | POA: Diagnosis not present

## 2019-11-22 DIAGNOSIS — J45909 Unspecified asthma, uncomplicated: Secondary | ICD-10-CM

## 2019-11-22 DIAGNOSIS — B20 Human immunodeficiency virus [HIV] disease: Secondary | ICD-10-CM

## 2019-11-22 DIAGNOSIS — O0990 Supervision of high risk pregnancy, unspecified, unspecified trimester: Secondary | ICD-10-CM | POA: Diagnosis not present

## 2019-11-22 DIAGNOSIS — O98719 Human immunodeficiency virus [HIV] disease complicating pregnancy, unspecified trimester: Secondary | ICD-10-CM

## 2019-11-22 DIAGNOSIS — Z3A19 19 weeks gestation of pregnancy: Secondary | ICD-10-CM | POA: Diagnosis not present

## 2019-11-22 DIAGNOSIS — O98712 Human immunodeficiency virus [HIV] disease complicating pregnancy, second trimester: Secondary | ICD-10-CM | POA: Diagnosis not present

## 2019-11-22 DIAGNOSIS — O98512 Other viral diseases complicating pregnancy, second trimester: Secondary | ICD-10-CM | POA: Diagnosis not present

## 2019-11-22 DIAGNOSIS — O99512 Diseases of the respiratory system complicating pregnancy, second trimester: Secondary | ICD-10-CM | POA: Diagnosis not present

## 2019-11-23 ENCOUNTER — Other Ambulatory Visit: Payer: Medicaid Other

## 2019-11-23 ENCOUNTER — Other Ambulatory Visit: Payer: Self-pay

## 2019-11-23 DIAGNOSIS — B20 Human immunodeficiency virus [HIV] disease: Secondary | ICD-10-CM

## 2019-11-23 DIAGNOSIS — O09899 Supervision of other high risk pregnancies, unspecified trimester: Secondary | ICD-10-CM

## 2019-11-29 ENCOUNTER — Other Ambulatory Visit: Payer: Self-pay

## 2019-11-29 ENCOUNTER — Other Ambulatory Visit: Payer: Self-pay | Admitting: Infectious Diseases

## 2019-11-29 ENCOUNTER — Ambulatory Visit (INDEPENDENT_AMBULATORY_CARE_PROVIDER_SITE_OTHER): Payer: Medicaid Other | Admitting: Family Medicine

## 2019-11-29 ENCOUNTER — Encounter: Payer: Self-pay | Admitting: Family Medicine

## 2019-11-29 VITALS — BP 107/73 | HR 109 | Wt 157.0 lb

## 2019-11-29 DIAGNOSIS — O99212 Obesity complicating pregnancy, second trimester: Secondary | ICD-10-CM

## 2019-11-29 DIAGNOSIS — O09892 Supervision of other high risk pregnancies, second trimester: Secondary | ICD-10-CM

## 2019-11-29 DIAGNOSIS — O0990 Supervision of high risk pregnancy, unspecified, unspecified trimester: Secondary | ICD-10-CM

## 2019-11-29 NOTE — Patient Instructions (Signed)
Safe Medications in Pregnancy   Indigestion: Tums Maalox Mylanta Cimetidine (Tagamet HB)** preferred in pregnancy Famotidine (Pepcid) Ranitidine (Zantac)

## 2019-11-29 NOTE — Progress Notes (Signed)
   PRENATAL VISIT NOTE  Subjective:  Erica Walton is a 24 y.o. [id number] at [redacted]w[redacted]d being seen today for ongoing prenatal care.  She is currently monitored for the following issues for this high-risk pregnancy and has Asthma; Human immunodeficiency virus (HIV) disease (HCC); Acute appendicitis with localized peritonitis; Urticarial rash; Supervision of high risk pregnancy, antepartum; HIV (human immunodeficiency virus) risk factors complicating pregnancy; Obesity affecting pregnancy; and Herpes on their problem list.  Patient reports no complaints.  Contractions: Not present. Vag. Bleeding: None.  Movement: Present. Denies leaking of fluid.   The following portions of the patient's history were reviewed and updated as appropriate: allergies, current medications, past family history, past medical history, past social history, past surgical history and problem list.   Objective:   Vitals:   11/29/19 1452  BP: 107/73  Pulse: (!) 109  Weight: 157 lb (71.2 kg)    Fetal Status: Fetal Heart Rate (bpm): 150 Fundal Height: 22 cm Movement: Present     General:  Alert, oriented and cooperative. Patient is in no acute distress.  Skin: Skin is warm and dry. No rash noted.   Cardiovascular: Normal heart rate noted  Respiratory: Normal respiratory effort, no problems with respiration noted  Abdomen: Soft, gravid, appropriate for gestational age.  Pain/Pressure: Absent     Pelvic: Cervical exam deferred        Extremities: Normal range of motion.  Edema: None  Mental Status: Normal mood and affect. Normal behavior. Normal judgment and thought content.   Assessment and Plan:  Pregnancy: G5P4004 at [redacted]w[redacted]d 1. Supervision of high risk pregnancy, antepartum Up to date Reviewed pregnancy meds for GERD  2. HIV risk factors affecting pregnancy in second trimester Well controlled, not detected viral load  3. Obesity affecting pregnancy in second trimester TWG=-1 lb (-0.454 kg)   Preterm labor  symptoms and general obstetric precautions including but not limited to vaginal bleeding, contractions, leaking of fluid and fetal movement were reviewed in detail with the patient. Please refer to After Visit Summary for other counseling recommendations.   Return in about 4 weeks (around 12/27/2019) for Routine prenatal care.  Future Appointments  Date Time Provider Department Center  12/01/2019 11:00 AM [facility] LAB [facility]  12/14/2019  3:15 PM Blanchard Kelch, NP [facility]  12/20/2019  1:00 PM [facility] NURSE [facility]  12/20/2019  1:15 PM [facility] US2 [facility]    Federico Flake, MD

## 2019-12-01 ENCOUNTER — Other Ambulatory Visit: Payer: Medicaid Other

## 2019-12-01 ENCOUNTER — Other Ambulatory Visit: Payer: Self-pay

## 2019-12-01 DIAGNOSIS — B20 Human immunodeficiency virus [HIV] disease: Secondary | ICD-10-CM

## 2019-12-01 LAB — T-HELPER CELL (CD4) - (RCID CLINIC ONLY)
CD4 % Helper T Cell: 35 % (ref 33–65)
CD4 T Cell Abs: 643 /uL (ref 400–1790)

## 2019-12-04 LAB — CBC WITH DIFFERENTIAL/PLATELET
Absolute Monocytes: 484 cells/uL (ref 200–950)
Basophils Absolute: 44 cells/uL (ref 0–200)
Basophils Relative: 0.5 %
Eosinophils Absolute: 598 cells/uL — ABNORMAL HIGH (ref 15–500)
Eosinophils Relative: 6.8 %
HCT: 32.9 % — ABNORMAL LOW (ref 35.0–45.0)
Hemoglobin: 10.9 g/dL — ABNORMAL LOW (ref 11.7–15.5)
Lymphs Abs: 1778 cells/uL (ref 850–3900)
MCH: 27.4 pg (ref 27.0–33.0)
MCHC: 33.1 g/dL (ref 32.0–36.0)
MCV: 82.7 fL (ref 80.0–100.0)
MPV: 10.1 fL (ref 7.5–12.5)
Monocytes Relative: 5.5 %
Neutro Abs: 5896 cells/uL (ref 1500–7800)
Neutrophils Relative %: 67 %
Platelets: 322 10*3/uL (ref 140–400)
RBC: 3.98 10*6/uL (ref 3.80–5.10)
RDW: 13.3 % (ref 11.0–15.0)
Total Lymphocyte: 20.2 %
WBC: 8.8 10*3/uL (ref 3.8–10.8)

## 2019-12-04 LAB — COMPLETE METABOLIC PANEL WITH GFR
AG Ratio: 1.4 (calc) (ref 1.0–2.5)
ALT: 14 U/L (ref 6–29)
AST: 16 U/L (ref 10–30)
Albumin: 3.5 g/dL — ABNORMAL LOW (ref 3.6–5.1)
Alkaline phosphatase (APISO): 90 U/L (ref 31–125)
BUN/Creatinine Ratio: 10 (calc) (ref 6–22)
BUN: 5 mg/dL — ABNORMAL LOW (ref 7–25)
CO2: 25 mmol/L (ref 20–32)
Calcium: 8.7 mg/dL (ref 8.6–10.2)
Chloride: 105 mmol/L (ref 98–110)
Creat: 0.52 mg/dL (ref 0.50–1.10)
GFR, Est African American: 154 mL/min/{1.73_m2} (ref >=60)
GFR, Est Non African American: 133 mL/min/{1.73_m2} (ref >=60)
Globulin: 2.5 g/dL (calc) (ref 1.9–3.7)
Glucose, Bld: 116 mg/dL — ABNORMAL HIGH (ref 65–99)
Potassium: 4 mmol/L (ref 3.5–5.3)
Sodium: 138 mmol/L (ref 135–146)
Total Bilirubin: 0.2 mg/dL (ref 0.2–1.2)
Total Protein: 6 g/dL — ABNORMAL LOW (ref 6.1–8.1)

## 2019-12-04 LAB — HIV-1 RNA QUANT-NO REFLEX-BLD
HIV 1 RNA Quant: <20 Copies/mL — ABNORMAL HIGH
HIV-1 RNA Quant, Log: <1.3 Log cps/mL — ABNORMAL HIGH

## 2019-12-14 ENCOUNTER — Other Ambulatory Visit: Payer: Self-pay

## 2019-12-14 ENCOUNTER — Telehealth (INDEPENDENT_AMBULATORY_CARE_PROVIDER_SITE_OTHER): Payer: Medicaid Other | Admitting: Infectious Diseases

## 2019-12-14 DIAGNOSIS — O0990 Supervision of high risk pregnancy, unspecified, unspecified trimester: Secondary | ICD-10-CM

## 2019-12-14 DIAGNOSIS — O98719 Human immunodeficiency virus [HIV] disease complicating pregnancy, unspecified trimester: Secondary | ICD-10-CM

## 2019-12-14 DIAGNOSIS — B20 Human immunodeficiency virus [HIV] disease: Secondary | ICD-10-CM | POA: Diagnosis not present

## 2019-12-14 MED ORDER — EMTRICITABINE-TENOFOVIR DF 200-300 MG PO TABS: 1.0000 | ORAL_TABLET | Freq: Every day | ORAL | 6 refills | Status: DC

## 2019-12-14 MED ORDER — TIVICAY 50 MG PO TABS
50.0000 mg | ORAL_TABLET | Freq: Every day | ORAL | 6 refills | Status: DC
Start: 2019-12-14 — End: 2020-05-10

## 2019-12-14 NOTE — Progress Notes (Signed)
Name: Erica Walton  DOB: March 10, 1996 MRN: [id number] PCP: Patient, No Pcp Per   VIRTUAL CARE ENCOUNTER  I connected with GEORGINE WILTSE on 01/14/20 at  3:15 PM EDT by VIDEO and verified that I am speaking with the correct person using two identifiers.   I discussed the limitations, risks, security and privacy concerns of performing an evaluation and management service by virtual service and the availability of in person appointments. I also discussed with the patient that there may be a patient responsible charge related to this service. The patient expressed understanding and agreed to proceed.  Patient Location: [place] residence    Other Participants:   Provider Location: Provider home    Patient Active Problem List   Diagnosis Date Noted  . Supervision of high risk pregnancy, antepartum 09/06/2019  . HIV infection in mother during pregnancy, antepartum 09/06/2019  . 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 Herpes 10/06/2013  . Asthma 11/17/2011  . Human immunodeficiency virus (HIV) disease ([place]) 11/17/2011     Brief Narrative:  Erica Walton is a 24 y.o. female with well controlled HIV, Dx 2013 with routine pregnancy testing. Has always been on treatment and previously in care through [facility].  CD4 nadir 300  HIV Risk: heterosexual  History of OIs: none known  Intake Labs 06/2019: Hep B sAg (-), sAb (+), cAb (-); Hep A (-), Hep C (-) Quantiferon (-) HLA B*5701 (-) G6PD: ()   Previous Regimens: . Truvada + Prezista/Ritonovir . Stribild . Genvoya --> undetectable  .    Genotypes: . 2013 - K103N    Subjective:   Chief Complaint  Patient presents with  . Follow-up    no complaints;        HPI: Erica Walton is here for follow-up on routine HIV care. She is pregnant and has questions around breast-feeding her child. She is never diagnosed with any of her previous children and has been reading up a lot from the [facility] websites. She has not yet  reached out to her previous pediatricians [facility] but plans to call him. Unsure of what the phone number is needs help finding it. She believes his name was Dr. Sunday Shams. Overall her pregnancy has been going pretty well. She is in care with high risk prenatal clinic. Continues to take her antiretrovirals once a day as instructed. No missed doses. She is taking prenatal vitamins dosed several hours apart from Isentress and Truvada.   Review of Systems  Constitutional: Negative for chills, fever, malaise/fatigue and weight loss.  HENT: Negative for sore throat.        No dental problems  Respiratory: Negative for cough and sputum production.   Cardiovascular: Negative for chest pain and leg swelling.  Gastrointestinal: Negative for abdominal pain, diarrhea and vomiting.  Genitourinary: Negative for dysuria and flank pain.  Musculoskeletal: Negative for joint pain, myalgias and neck pain.  Skin: Negative for rash.  Neurological: Negative for dizziness, tingling and headaches.  Psychiatric/Behavioral: Negative for depression and substance abuse. The patient is not nervous/anxious and does not have insomnia.     Past Medical History:  Diagnosis Date  . Asthma   . Genital herpes   . HIV (human immunodeficiency virus infection) ([place])     Outpatient Medications Prior to Visit  Medication Sig Dispense Refill  . emtricitabine-tenofovir (TRUVADA) 200-300 MG tablet TAKE 1 TABLET BY MOUTH EVERY DAY 30 tablet 0  . ISENTRESS 400 MG tablet TAKE 1 TABLET BY MOUTH TWICE A DAY 60  tablet 0  . albuterol (PROVENTIL HFA;VENTOLIN HFA) 108 (90 Base) MCG/ACT inhaler Inhale 2 puffs into the lungs every 6 (six) hours as needed for wheezing or shortness of breath. 1 Inhaler 0  . aspirin EC 81 MG tablet Take 1 tablet (81 mg total) by mouth daily. Take after 12 weeks for prevention of preeclampsia later in pregnancy 300 tablet 2  . calcium carbonate (TUMS - DOSED IN MG ELEMENTAL CALCIUM) 500 MG chewable tablet Chew 1  tablet by mouth daily.     . Prenatal Vit-Fe Fumarate-FA (MULTIVITAMIN-PRENATAL) 27-0.8 MG TABS tablet Take 1 tablet by mouth daily at 12 noon.    . Prenatal Vit-Fe Phos-FA-Omega (VITAFOL GUMMIES) 3.33-0.333-34.8 MG CHEW Chew 1 tablet by mouth daily. 90 tablet 12   No facility-administered medications prior to visit.     Allergies  Allergen Reactions  . Norco [Hydrocodone-Acetaminophen] Rash  . Penicillins Rash and Other (See Comments)    Has patient had a PCN reaction causing immediate rash, facial/tongue/throat swelling, SOB or lightheadedness with hypotension: yes, JOAC:[id number]} Has patient had a PCN reaction causing severe rash involving mucus membranes or skin necrosis: no:[id number]} Has patient had a PCN reaction that required hospitalization no:[id number]} Has patient had a PCN reaction occurring within the last 10 years: no:[id number]} If all of the above answers are "NO", then may proceed with Cephalosporin use.     Social History   Tobacco Use  . Smoking status: Never Smoker  . Smokeless tobacco: Never Used  Substance Use Topics  . Alcohol use: No  . Drug use: Not Currently    Family History  Problem Relation Age of Onset  . Cancer Father   . Breast cancer Other     Social History   Substance and Sexual Activity  Sexual Activity Yes  . Birth control/protection: None     Objective:   There were no vitals filed for this visit. There is no height or weight on file to calculate BMI.  Physical Exam Constitutional:      Appearance: Normal appearance. She is not ill-appearing.  HENT:     Mouth/Throat:     Mouth: Mucous membranes are moist.     Pharynx: Oropharynx is clear.  Eyes:     General: No scleral icterus. Pulmonary:     Effort: Pulmonary effort is normal.  Neurological:     Mental Status: She is oriented to person, place, and time.  Psychiatric:        Mood and Affect: Mood normal.        Thought Content: Thought content normal.     Lab  Results Lab Results  Component Value Date   WBC 8.8 12/01/2019   HGB 10.9 (L) 12/01/2019   HCT 32.9 (L) 12/01/2019   MCV 82.7 12/01/2019   PLT 322 12/01/2019    Lab Results  Component Value Date   CREATININE 0.52 12/01/2019   BUN 5 (L) 12/01/2019   NA 138 12/01/2019   K 4.0 12/01/2019   CL 105 12/01/2019   CO2 25 12/01/2019    Lab Results  Component Value Date   ALT 14 12/01/2019   AST 16 12/01/2019   ALKPHOS 101 07/21/2019   BILITOT 0.2 12/01/2019    Lab Results  Component Value Date   CHOL 186 07/17/2019   HDL 42 (L) 07/17/2019   LDLCALC 112 (H) 07/17/2019   TRIG 207 (H) 07/17/2019   CHOLHDL 4.4 07/17/2019   HIV 1 RNA Quant  Date Value  12/01/2019 <20 Copies/mL (H)  07/17/2019 <20 NOT DETECTED copies/mL   CD4 T Cell Abs (/uL)  Date Value  12/01/2019 643  07/17/2019 801     Assessment & Plan:   Problem List Items Addressed This Visit      Unprioritized   Supervision of high risk pregnancy, antepartum - Primary   Human immunodeficiency virus (HIV) disease ([place])    Violet continues to do very well on her antiretroviral treatment. She is faithful about taking this every day. She is appropriately spacing out her prenatal vitamins from the dolutegravir. Recent viral load again undetectable. Recommending flu shot, Covid vaccine if not already given and Tdap after 28 weeks. We will have her return to repeat blood work again in 2-3 months with a final viral load around 34 to 36 weeks. Looks like they are planning on induction with a C-section due to previous cesareans.      Relevant Medications   dolutegravir (TIVICAY) 50 MG tablet   emtricitabine-tenofovir (TRUVADA) 200-300 MG tablet   HIV infection in mother during pregnancy, antepartum    We spent a lot of time discussing risks and benefits of breast-feeding her newborn. In regards to the [facility] statements that she has read this is mostly pertaining to women in [place] or other third world areas that do not have  access to better nutrition and clean water, so the risks of denying breastmilk in this population is much higher than compared to here in [place] we have more resources available. After discussion I encouraged her to reach out to her previous pediatrician who managed her other children after delivery. I am not certain if he has any protocols on the matter, however I think she needs to get all the information and specifics about breast-feeding risks so we can offer the best approach at harm reduction for her and her baby.      Relevant Medications   dolutegravir (TIVICAY) 50 MG tablet   emtricitabine-tenofovir (TRUVADA) 200-300 MG tablet      Janene Madeira, MSN, NP-C [facility] [place] Pager: [phone] Office: [phone]  01/14/20  3:45 PM

## 2019-12-14 NOTE — Patient Instructions (Addendum)
STOP Isentress (the twice a day one)  START Tivicay - once a day (yellow circle pill)   CONTINUE your Truvada once a day (blue pill)    Please call Dr. Dario Guardian, MD, PhD Phone: [phone]

## 2019-12-20 ENCOUNTER — Ambulatory Visit: Payer: Medicaid Other | Attending: Obstetrics and Gynecology

## 2019-12-20 ENCOUNTER — Other Ambulatory Visit: Payer: Self-pay | Admitting: *Deleted

## 2019-12-20 ENCOUNTER — Ambulatory Visit: Payer: Medicaid Other | Admitting: *Deleted

## 2019-12-20 ENCOUNTER — Other Ambulatory Visit: Payer: Self-pay

## 2019-12-20 DIAGNOSIS — J45909 Unspecified asthma, uncomplicated: Secondary | ICD-10-CM

## 2019-12-20 DIAGNOSIS — B009 Herpesviral infection, unspecified: Secondary | ICD-10-CM

## 2019-12-20 DIAGNOSIS — Z362 Encounter for other antenatal screening follow-up: Secondary | ICD-10-CM | POA: Diagnosis not present

## 2019-12-20 DIAGNOSIS — O0990 Supervision of high risk pregnancy, unspecified, unspecified trimester: Secondary | ICD-10-CM

## 2019-12-20 DIAGNOSIS — O98512 Other viral diseases complicating pregnancy, second trimester: Secondary | ICD-10-CM | POA: Diagnosis not present

## 2019-12-20 DIAGNOSIS — O98719 Human immunodeficiency virus [HIV] disease complicating pregnancy, unspecified trimester: Secondary | ICD-10-CM | POA: Diagnosis not present

## 2019-12-20 DIAGNOSIS — O99212 Obesity complicating pregnancy, second trimester: Secondary | ICD-10-CM

## 2019-12-20 DIAGNOSIS — O09892 Supervision of other high risk pregnancies, second trimester: Secondary | ICD-10-CM | POA: Insufficient documentation

## 2019-12-20 DIAGNOSIS — O98712 Human immunodeficiency virus [HIV] disease complicating pregnancy, second trimester: Secondary | ICD-10-CM

## 2019-12-20 DIAGNOSIS — Z3A23 23 weeks gestation of pregnancy: Secondary | ICD-10-CM

## 2019-12-20 DIAGNOSIS — Z21 Asymptomatic human immunodeficiency virus [HIV] infection status: Secondary | ICD-10-CM | POA: Diagnosis not present

## 2019-12-27 ENCOUNTER — Encounter: Payer: Medicaid Other | Admitting: Student

## 2019-12-28 ENCOUNTER — Other Ambulatory Visit: Payer: Self-pay | Admitting: Infectious Diseases

## 2020-01-03 ENCOUNTER — Other Ambulatory Visit: Payer: Self-pay

## 2020-01-03 ENCOUNTER — Ambulatory Visit (INDEPENDENT_AMBULATORY_CARE_PROVIDER_SITE_OTHER): Payer: Medicaid Other | Admitting: Family Medicine

## 2020-01-03 VITALS — BP 117/74 | HR 97 | Wt 162.0 lb

## 2020-01-03 DIAGNOSIS — O0990 Supervision of high risk pregnancy, unspecified, unspecified trimester: Secondary | ICD-10-CM

## 2020-01-03 DIAGNOSIS — Z23 Encounter for immunization: Secondary | ICD-10-CM | POA: Diagnosis not present

## 2020-01-03 NOTE — Progress Notes (Signed)
Has an itchy belly

## 2020-01-03 NOTE — Patient Instructions (Signed)

## 2020-01-05 NOTE — Progress Notes (Signed)
   PRENATAL VISIT NOTE  Subjective:  Erica Walton is a 24 y.o. [id number] at [redacted]w[redacted]d being seen today for ongoing prenatal care.  She is currently monitored for the following issues for this high-risk pregnancy and has Asthma; Human immunodeficiency virus (HIV) disease (HCC); Acute appendicitis with localized peritonitis; Urticarial rash; Supervision of high risk pregnancy, antepartum; HIV (human immunodeficiency virus) risk factors complicating pregnancy; Obesity affecting pregnancy; and Herpes on their problem list.  Patient reports Pruritus on abdomen.  Contractions: Irritability. Vag. Bleeding: None.  Movement: Present. Denies leaking of fluid.   The following portions of the patient's history were reviewed and updated as appropriate: allergies, current medications, past family history, past medical history, past social history, past surgical history and problem list.   Objective:   Vitals:   01/03/20 1520  BP: 117/74  Pulse: 97  Weight: 162 lb (73.5 kg)    Fetal Status: Fetal Heart Rate (bpm): 150   Movement: Present     General:  Alert, oriented and cooperative. Patient is in no acute distress.  Skin: Skin is warm and dry. No rash noted.   Cardiovascular: Normal heart rate noted  Respiratory: Normal respiratory effort, no problems with respiration noted  Abdomen: Soft, gravid, appropriate for gestational age.  Pain/Pressure: Absent     Pelvic: Cervical exam deferred        Extremities: Normal range of motion.  Edema: None  Skin:  Excoriations on abdomen  Mental Status: Normal mood and affect. Normal behavior. Normal judgment and thought content.   Assessment and Plan:  Pregnancy: G5P4004 at [redacted]w[redacted]d 1. HIV affecting pregnancy Continue Tivicay and Truvada  2. Supervision of high risk pregnancy, antepartum Trial of hydrocortisone to her abdomen. 28-week labs next visit  Preterm labor symptoms and general obstetric precautions including but not limited to vaginal bleeding,  contractions, leaking of fluid and fetal movement were reviewed in detail with the patient. Please refer to After Visit Summary for other counseling recommendations.   Return in 3 weeks (on 01/24/2020) for [facility], in person, 28 wk labs.  Future Appointments  Date Time Provider Department Center  01/17/2020  3:30 PM [facility]URSE [facility]  01/17/2020  3:45 PM [facility] US4 [facility]  01/24/2020  8:45 AM Marylene Land, CNM [facility]  01/29/2020  4:00 PM [facility] LAB [facility]    Reva Bores, MD

## 2020-01-14 NOTE — Assessment & Plan Note (Signed)
We spent a lot of time discussing risks and benefits of breast-feeding her newborn. In regards to the [facility] statements that she has read this is mostly pertaining to women in [place] or other third world areas that do not have access to better nutrition and clean water, so the risks of denying breastmilk in this population is much higher than compared to here in [place] we have more resources available. After discussion I encouraged her to reach out to her previous pediatrician who managed her other children after delivery. I am not certain if he has any protocols on the matter, however I think she needs to get all the information and specifics about breast-feeding risks so we can offer the best approach at harm reduction for her and her baby.

## 2020-01-14 NOTE — Assessment & Plan Note (Signed)
Erica Walton continues to do very well on her antiretroviral treatment. She is faithful about taking this every day. She is appropriately spacing out her prenatal vitamins from the dolutegravir. Recent viral load again undetectable. Recommending flu shot, Covid vaccine if not already given and Tdap after 28 weeks. We will have her return to repeat blood work again in 2-3 months with a final viral load around 34 to 36 weeks. Looks like they are planning on induction with a C-section due to previous cesareans.

## 2020-01-17 ENCOUNTER — Other Ambulatory Visit: Payer: Self-pay

## 2020-01-17 ENCOUNTER — Ambulatory Visit: Payer: Medicaid Other | Attending: Obstetrics and Gynecology

## 2020-01-17 ENCOUNTER — Ambulatory Visit: Payer: Medicaid Other | Admitting: *Deleted

## 2020-01-17 DIAGNOSIS — Z21 Asymptomatic human immunodeficiency virus [HIV] infection status: Secondary | ICD-10-CM

## 2020-01-17 DIAGNOSIS — B009 Herpesviral infection, unspecified: Secondary | ICD-10-CM | POA: Diagnosis not present

## 2020-01-17 DIAGNOSIS — O99512 Diseases of the respiratory system complicating pregnancy, second trimester: Secondary | ICD-10-CM | POA: Diagnosis not present

## 2020-01-17 DIAGNOSIS — O98719 Human immunodeficiency virus [HIV] disease complicating pregnancy, unspecified trimester: Secondary | ICD-10-CM | POA: Insufficient documentation

## 2020-01-17 DIAGNOSIS — O99212 Obesity complicating pregnancy, second trimester: Secondary | ICD-10-CM | POA: Insufficient documentation

## 2020-01-17 DIAGNOSIS — O0990 Supervision of high risk pregnancy, unspecified, unspecified trimester: Secondary | ICD-10-CM | POA: Insufficient documentation

## 2020-01-17 DIAGNOSIS — Z3A27 27 weeks gestation of pregnancy: Secondary | ICD-10-CM

## 2020-01-17 DIAGNOSIS — O98712 Human immunodeficiency virus [HIV] disease complicating pregnancy, second trimester: Secondary | ICD-10-CM

## 2020-01-17 DIAGNOSIS — J45909 Unspecified asthma, uncomplicated: Secondary | ICD-10-CM

## 2020-01-17 DIAGNOSIS — Z362 Encounter for other antenatal screening follow-up: Secondary | ICD-10-CM

## 2020-01-17 DIAGNOSIS — O98512 Other viral diseases complicating pregnancy, second trimester: Secondary | ICD-10-CM

## 2020-01-18 ENCOUNTER — Other Ambulatory Visit: Payer: Self-pay | Admitting: *Deleted

## 2020-01-18 DIAGNOSIS — O98719 Human immunodeficiency virus [HIV] disease complicating pregnancy, unspecified trimester: Secondary | ICD-10-CM

## 2020-01-24 ENCOUNTER — Ambulatory Visit (INDEPENDENT_AMBULATORY_CARE_PROVIDER_SITE_OTHER): Payer: Medicaid Other | Admitting: Student

## 2020-01-24 ENCOUNTER — Other Ambulatory Visit: Payer: Self-pay

## 2020-01-24 VITALS — BP 112/78 | HR 97 | Wt 165.8 lb

## 2020-01-24 DIAGNOSIS — J45909 Unspecified asthma, uncomplicated: Secondary | ICD-10-CM

## 2020-01-24 DIAGNOSIS — Z3A28 28 weeks gestation of pregnancy: Secondary | ICD-10-CM | POA: Diagnosis not present

## 2020-01-24 DIAGNOSIS — Z23 Encounter for immunization: Secondary | ICD-10-CM

## 2020-01-24 DIAGNOSIS — O0990 Supervision of high risk pregnancy, unspecified, unspecified trimester: Secondary | ICD-10-CM | POA: Diagnosis not present

## 2020-01-24 DIAGNOSIS — Z7189 Other specified counseling: Secondary | ICD-10-CM | POA: Diagnosis not present

## 2020-01-24 DIAGNOSIS — O98713 Human immunodeficiency virus [HIV] disease complicating pregnancy, third trimester: Secondary | ICD-10-CM

## 2020-01-24 DIAGNOSIS — Z21 Asymptomatic human immunodeficiency virus [HIV] infection status: Secondary | ICD-10-CM

## 2020-01-24 DIAGNOSIS — O0993 Supervision of high risk pregnancy, unspecified, third trimester: Secondary | ICD-10-CM

## 2020-01-24 DIAGNOSIS — B20 Human immunodeficiency virus [HIV] disease: Secondary | ICD-10-CM

## 2020-01-24 DIAGNOSIS — O99513 Diseases of the respiratory system complicating pregnancy, third trimester: Secondary | ICD-10-CM

## 2020-01-24 LAB — CBC
Hematocrit: 33.6 % — ABNORMAL LOW (ref 34.0–46.6)
Hemoglobin: 10.7 g/dL — ABNORMAL LOW (ref 11.1–15.9)
MCH: 25 pg — ABNORMAL LOW (ref 26.6–33.0)
MCHC: 31.8 g/dL (ref 31.5–35.7)
MCV: 79 fL (ref 79–97)
Platelets: 294 10*3/uL (ref 150–450)
RBC: 4.28 x10E6/uL (ref 3.77–5.28)
RDW: 14.1 % (ref 11.7–15.4)
WBC: 12.5 10*3/uL — ABNORMAL HIGH (ref 3.4–10.8)

## 2020-01-24 NOTE — Addendum Note (Signed)
Addended by: Blanchard Kelch on: 01/24/2020 12:40 PM   Modules accepted: Orders

## 2020-01-24 NOTE — Progress Notes (Signed)
   PRENATAL VISIT NOTE  Subjective:  Erica Walton is a 24 y.o. [id number] at [redacted]w[redacted]d being seen today for ongoing prenatal care.  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  Patient reports no complaints.  Contractions: Irritability. Vag. Bleeding: None.  Movement: Present. Denies leaking of fluid.   The following portions of the patient's history were reviewed and updated as appropriate: allergies, current medications, past family history, past medical history, past social history, past surgical history and problem list.   Objective:   Vitals:   01/24/20 0930  BP: 112/78  Pulse: 97  Weight: 165 lb 12.8 oz (75.2 kg)    Fetal Status: Fetal Heart Rate (bpm): 146 Fundal Height: 28 cm Movement: Present     General:  Alert, oriented and cooperative. Patient is in no acute distress.  Skin: Skin is warm and dry. No rash noted.   Cardiovascular: Normal heart rate noted  Respiratory: Normal respiratory effort, no problems with respiration noted  Abdomen: Soft, gravid, appropriate for gestational age.  Pain/Pressure: Present     Pelvic: Cervical exam deferred        Extremities: Normal range of motion.  Edema: None  Mental Status: Normal mood and affect. Normal behavior. Normal judgment and thought content.   Assessment and Plan:  Pregnancy: G5P4004 at [redacted]w[redacted]d 1. Supervision of high risk pregnancy, antepartum -Wants Tubal; signed papers today -Confirmed with MFM that growth scans are sufficient for now, we do not need to start antenatal testing with low VL.  -Patient's HIV meds managed by ID; patient reports that she is compliant and attends her appointments with regularity -Discussed COVID 19 vaccine in detail.  COVID-19  Vaccine Counseling: The patient was counseled on the potential benefits and lack of known risks of COVID vaccination, during pregnancy and breastfeeding, during today's visit. The patient's questions and concerns were addressed today, including safety of the vaccination and potential side effects as they have been published by ACOG and [facility]. The patient has been informed that there have not been any documented vaccine related injuries, deaths or birth defects to infant or mom after receiving the COVID-19 vaccine to date. The patient has been made aware that although she is not at increased risk of contracting COVID-19 during pregnancy, she is at increased risk of developing severe disease and complications if she contracts COVID-19 while pregnant. All patient questions were addressed during our visit today. The patient is planning to get vaccinated.    - Glucose Tolerance, 2 Hours w/1 Hour - CBC - RPR  2. [redacted] weeks gestation of pregnancy  - Glucose Tolerance, 2 Hours w/1 Hour - CBC - RPR  Preterm labor symptoms and general obstetric precautions including but not limited to vaginal bleeding, contractions, leaking of fluid and fetal movement were reviewed in detail with the patient. Please refer to After Visit Summary for other counseling recommendations.   Return in about 2 weeks (around 02/07/2020), or HROB.  Future Appointments  Date Time Provider Department Center  01/29/2020  4:00 PM [facility]  02/07/2020  9:10 AM Marylene Land, CNM [facility]  02/14/2020  3:30 PM [facility] NURSE [facility]  02/14/2020  3:45 PM [facility] US4 [facility]    Samara Deist Gena Fray, CNM

## 2020-01-24 NOTE — Patient Instructions (Signed)

## 2020-01-25 LAB — GLUCOSE TOLERANCE, 2 HOURS W/ 1HR
Glucose, 1 hour: 148 mg/dL (ref 65–179)
Glucose, 2 hour: 119 mg/dL (ref 65–152)
Glucose, Fasting: 83 mg/dL (ref 65–91)

## 2020-01-25 LAB — RPR: RPR Ser Ql: NONREACTIVE

## 2020-01-29 ENCOUNTER — Other Ambulatory Visit: Payer: Medicaid Other

## 2020-02-07 ENCOUNTER — Ambulatory Visit (INDEPENDENT_AMBULATORY_CARE_PROVIDER_SITE_OTHER): Payer: Medicaid Other | Admitting: Student

## 2020-02-07 ENCOUNTER — Other Ambulatory Visit: Payer: Self-pay

## 2020-02-07 VITALS — BP 111/74 | HR 97 | Wt 166.0 lb

## 2020-02-07 DIAGNOSIS — O0990 Supervision of high risk pregnancy, unspecified, unspecified trimester: Secondary | ICD-10-CM

## 2020-02-07 DIAGNOSIS — Z3A30 30 weeks gestation of pregnancy: Secondary | ICD-10-CM

## 2020-02-07 NOTE — Patient Instructions (Signed)
Breastfeeding and Inducing Lactation Induced lactation is a process in which a woman who is not producing breast milk is made to produce it. Induced lactation may be done in cases of:  Adoption.  Having another woman give birth to your baby (surrogacy).  Restarting breastfeeding after stopping it for a period of time.  A mother who is nursing an older toddler and wants to meet the milk supply for a newborn. Induced lactation is more likely to be successful in women who have been pregnant before. How does the body produce breast milk? The process of producing breast milk starts when you get pregnant. At this time, hormones in your body change to prepare your body to make breast milk. Once your baby is born, your hormones send signals that tell your body to make breast milk. How does induced lactation work? Induced lactation reproduces the process that the body naturally goes through to make breast milk. To help make breast milk, you may need to:  Take medicines.  Practice breast stimulation techniques. Breast stimulation techniques mimic a baby suckling at the breast. They can be done by: ? Gently rubbing and stretching your nipples. ? Using a double electric hospital-grade pump to pump your breasts. If you choose induced lactation, you may need to start taking medicines 3-4 months before you want to start breastfeeding. About 6 weeks before the planned breastfeeding start date, you may need to stop taking the medicines and start doing breast stimulation techniques several times per day. If you use a pump to pump your breasts, you may need to pump both breasts at the same time every 3 hours (8 times a day) for 20 minutes. Once your body is making milk and you start breastfeeding, your body will naturally increase the amount of milk it makes in response to the smell, sound, and feel of your baby. Will I make enough milk to feed my baby? Very few women are able to make all the milk their baby  needs. If you choose induced lactation, you may need to supplement feedings with donated breast milk or infant formula to make sure your baby gets enough nutrition. What else do I need to know?  Take medicines only as directed by your health care provider or trained lactation consultant.  Herbal medicines are available to induce lactation. These medicines are not approved or regulated by the FDA. Always check with your health care provider before using any herbal medicines.  If you need guidance, talk to your health care provider or lactation consultant. He or she may be able to help you start a milk supply and advise you in making important decisions about feeding your baby.  Induced lactation may cause you to experience some changes in your body, such as: ? Mild to moderate changes in your menstrual cycle. ? Some breast changes that include a feeling of fullness. ? Some changes in your breast shape. ? Milk leaking from your breasts from time to time.  Supplemental nursing systems are available to provide extra donated breast milk or formula at the breast while a baby nurses. The systems ensure that an infant gets enough nutrition during breastfeeding. Ask a lactation specialist for help finding and using this device.  Newborns or babies younger than 18 month old usually root at and accept the breast when using a supplemental nursing system. Rooting is when a baby opens his or her mouth upon being stroked on the cheek or lips. Contact a health care provider if:  Your  baby is older than 73 days old and: ? Does not seem satisfied after feeding at the breast. ? Is not producing 5-6 wet diapers per day. ? Is not producing 3 stools per day. Get help right away if:  Your breasts become swollen, red, and tender. Summary  Pregnancy naturally prepares the breasts to make breast milk. Induced lactation is a process in which a woman who is not producing breast milk is made to produce  it.  Lactation is usually induced by taking medicines and practicing breast stimulation techniques.  Very few women are able to make all the milk their baby needs. You may need to supplement feedings with donated breast milk or infant formula to make sure your baby gets enough nutrition. This information is not intended to replace advice given to you by your health care provider. Make sure you discuss any questions you have with your health care provider. Document Revised: 08/11/2016 Document Reviewed: 04/07/2016 Elsevier Patient Education  2020 [employer].

## 2020-02-07 NOTE — Progress Notes (Signed)
   PRENATAL VISIT NOTE  Subjective:  Erica Walton is a 24 y.o. [id number] at [redacted]w[redacted]d being seen today for ongoing prenatal care.  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  Patient reports no complaints. She  Is very interested in breast feeding. She has spoken with [facility] Pediatric infectious disease who stated that they are ok with breastfeeding; she would like to know if [facility] is ok with breastfeeding. She would like to know what the policy is, if her viral load is undetectable. She will follow up with pediatric infectious disease PP and for newborn care but she wants to know if staff and providers will be ok with her breastfeeding immediately PP. She is very very excited to breastfeed.   Contractions: Irritability. Vag. Bleeding: None.  Movement: Present. Denies leaking of fluid.   The following portions of the patient's history were reviewed and updated as appropriate: allergies, current medications, past family history, past medical history, past social history, past surgical history and problem list.   Objective:   Vitals:   02/07/20 0925  BP: 111/74  Pulse: 97  Weight: 166 lb (75.3 kg)    Fetal Status: Fetal Heart Rate (bpm): 139 Fundal Height: 31 cm Movement: Present     General:  Alert, oriented and cooperative. Patient is in no acute distress.  Skin: Skin is warm and dry. No rash noted.   Cardiovascular: Normal heart rate noted  Respiratory: Normal respiratory effort, no problems with respiration noted  Abdomen: Soft, gravid, appropriate for gestational age.  Pain/Pressure: Present     Pelvic: Cervical exam deferred        Extremities: Normal range of motion.  Edema: None  Mental Status: Normal mood and affect.  Normal behavior. Normal judgment and thought content.   Assessment and Plan:  Pregnancy: G5P4004 at [redacted]w[redacted]d 1. Supervision of high risk pregnancy, antepartum   2. [redacted] weeks gestation of pregnancy   -discussed risk factors and need for COVID vaccine.  -Patient will get letter from pediatric infectious disease (who will be following baby) to state the BF is ok.  -I will also reach out to peds and discuss the possibility of breastfeeding. I reassured patient that we want to support her in breastfeeding and that I will seek clarification on our policy.   Preterm labor symptoms and general obstetric precautions including but not limited to vaginal bleeding, contractions, leaking of fluid and fetal movement were reviewed in detail with the patient. Please refer to After Visit Summary for other counseling recommendations.   Return in about 2 weeks (around 02/21/2020), or HROB with Karyl Kinnier.  Future Appointments  Date Time Provider Department Center  02/14/2020  3:30 PM [facility] NURSE [facility]  02/14/2020  3:45 PM [facility] US4 [facility]  02/21/2020 10:00 AM Federico Flake, MD [facility]    Marylene Land, [place]

## 2020-02-14 ENCOUNTER — Ambulatory Visit: Payer: Medicaid Other | Admitting: *Deleted

## 2020-02-14 ENCOUNTER — Encounter: Payer: Self-pay | Admitting: *Deleted

## 2020-02-14 ENCOUNTER — Other Ambulatory Visit: Payer: Self-pay

## 2020-02-14 ENCOUNTER — Ambulatory Visit: Payer: Medicaid Other | Attending: Obstetrics

## 2020-02-14 DIAGNOSIS — B009 Herpesviral infection, unspecified: Secondary | ICD-10-CM

## 2020-02-14 DIAGNOSIS — O99513 Diseases of the respiratory system complicating pregnancy, third trimester: Secondary | ICD-10-CM

## 2020-02-14 DIAGNOSIS — O98713 Human immunodeficiency virus [HIV] disease complicating pregnancy, third trimester: Secondary | ICD-10-CM

## 2020-02-14 DIAGNOSIS — Z21 Asymptomatic human immunodeficiency virus [HIV] infection status: Secondary | ICD-10-CM

## 2020-02-14 DIAGNOSIS — O0990 Supervision of high risk pregnancy, unspecified, unspecified trimester: Secondary | ICD-10-CM | POA: Diagnosis not present

## 2020-02-14 DIAGNOSIS — O98719 Human immunodeficiency virus [HIV] disease complicating pregnancy, unspecified trimester: Secondary | ICD-10-CM

## 2020-02-14 DIAGNOSIS — Z362 Encounter for other antenatal screening follow-up: Secondary | ICD-10-CM | POA: Diagnosis not present

## 2020-02-14 DIAGNOSIS — Z3A31 31 weeks gestation of pregnancy: Secondary | ICD-10-CM

## 2020-02-14 DIAGNOSIS — O98513 Other viral diseases complicating pregnancy, third trimester: Secondary | ICD-10-CM | POA: Diagnosis not present

## 2020-02-14 DIAGNOSIS — J45909 Unspecified asthma, uncomplicated: Secondary | ICD-10-CM | POA: Diagnosis not present

## 2020-02-14 DIAGNOSIS — O99212 Obesity complicating pregnancy, second trimester: Secondary | ICD-10-CM | POA: Diagnosis not present

## 2020-02-15 ENCOUNTER — Other Ambulatory Visit: Payer: Self-pay | Admitting: *Deleted

## 2020-02-15 DIAGNOSIS — O98719 Human immunodeficiency virus [HIV] disease complicating pregnancy, unspecified trimester: Secondary | ICD-10-CM

## 2020-02-15 NOTE — Progress Notes (Unsigned)
us

## 2020-02-21 ENCOUNTER — Ambulatory Visit (INDEPENDENT_AMBULATORY_CARE_PROVIDER_SITE_OTHER): Payer: Medicaid Other | Admitting: Family Medicine

## 2020-02-21 ENCOUNTER — Other Ambulatory Visit: Payer: Self-pay

## 2020-02-21 VITALS — BP 105/71 | HR 88 | Wt 167.0 lb

## 2020-02-21 DIAGNOSIS — O99213 Obesity complicating pregnancy, third trimester: Secondary | ICD-10-CM

## 2020-02-21 DIAGNOSIS — O98719 Human immunodeficiency virus [HIV] disease complicating pregnancy, unspecified trimester: Secondary | ICD-10-CM

## 2020-02-21 DIAGNOSIS — B009 Herpesviral infection, unspecified: Secondary | ICD-10-CM

## 2020-02-21 DIAGNOSIS — O0990 Supervision of high risk pregnancy, unspecified, unspecified trimester: Secondary | ICD-10-CM

## 2020-02-21 MED ORDER — VALACYCLOVIR HCL 500 MG PO TABS: 500.0000 mg | ORAL_TABLET | Freq: Two times a day (BID) | ORAL | 6 refills | Status: DC

## 2020-02-21 NOTE — Progress Notes (Signed)
PRENATAL VISIT NOTE  Subjective:  Erica Walton is a 24 y.o. [id number] at [redacted]w[redacted]d being seen today for ongoing prenatal care.  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  Patient reports braxton hick contractions, never last > 1 hr but can get intense. They go away spontaneously.  Contractions: Irregular. Vag. Bleeding: None.  Movement: Present. Denies leaking of fluid.   The following portions of the patient's history were reviewed and updated as appropriate: allergies, current medications, past family history, past medical history, past social history, past surgical history and problem list.   Objective:   Vitals:   02/21/20 1034  BP: 105/71  Pulse: 88  Weight: 167 lb (75.8 kg)    Fetal Status: Fetal Heart Rate (bpm): 153   Movement: Present     General:  Alert, oriented and cooperative. Patient is in no acute distress.  Skin: Skin is warm and dry. No rash noted.   Cardiovascular: Normal heart rate noted  Respiratory: Normal respiratory effort, no problems with respiration noted  Abdomen: Soft, gravid, appropriate for gestational age.  Pain/Pressure: Present     Pelvic: Cervical exam deferred        Extremities: Normal range of motion.  Edema: None  Mental Status: Normal mood and affect. Normal behavior. Normal judgment and thought content.   Assessment and Plan:  Pregnancy: G5P4004 at [redacted]w[redacted]d  1. Supervision of high risk pregnancy, antepartum Updated box Reviewed HIV care and plan for breastfeeding- see below She is aware of recommendation for IOL at 39 wks and is interested in IOL late in 39th week if possible which is reasonable. She would prefer IOL on 12/24 (@[redacted]w[redacted]d ).  She agrees to AZT provision in  labor  2. 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 Patient plans to discuss with her pediatrician at [facility].   Today I read the now scanned agreement with the patient and she initialed and we signed the agreement. She will bring this to her pediatric office to have signature and will bring fully signed copy to the office. She is meeting with peds on 11/30.   CNM 12/30 plans to assure LD director and Lactation is aware so that we can assure team is aware of patient's intention to breastfeed and the approval by all service lines.  3. Obesity affecting pregnancy in third trimester TWG=9 lb (4.082 kg) which is at goal  4. Herpes Patient will start ppx today. Rx sent to pharmacy and patient aware and will pick up - valACYclovir (VALTREX) 500 MG tablet; Take 1 tablet (500 mg total) by mouth 2 (two) times daily.  Dispense: 60 tablet; Refill: 6   Preterm labor symptoms and general obstetric precautions including but not limited to vaginal bleeding, contractions, leaking of fluid and fetal movement were reviewed in detail with the patient. Please refer to After Visit Summary for other counseling recommendations.   Return in about 2 weeks (around 03/06/2020) for Routine prenatal care, MD or APP, in person.  Future Appointments  Date Time Provider Department Center  03/05/2020  4:15 PM 03/07/2020,  MD [facility]  03/13/2020  3:15 PM [facility] NURSE [facility]  03/13/2020  3:30 PM [facility] US3 [facility]  03/20/2020  3:15 PM Alvester Morin, Isa Rankin, MD [facility]    Federico Flake, MD

## 2020-03-05 ENCOUNTER — Other Ambulatory Visit: Payer: Self-pay

## 2020-03-05 ENCOUNTER — Ambulatory Visit (INDEPENDENT_AMBULATORY_CARE_PROVIDER_SITE_OTHER): Payer: Medicaid Other | Admitting: Family Medicine

## 2020-03-05 VITALS — BP 119/82 | HR 120 | Wt 171.0 lb

## 2020-03-05 DIAGNOSIS — B20 Human immunodeficiency virus [HIV] disease: Secondary | ICD-10-CM

## 2020-03-05 DIAGNOSIS — O98719 Human immunodeficiency virus [HIV] disease complicating pregnancy, unspecified trimester: Secondary | ICD-10-CM

## 2020-03-05 DIAGNOSIS — B009 Herpesviral infection, unspecified: Secondary | ICD-10-CM

## 2020-03-05 DIAGNOSIS — O0990 Supervision of high risk pregnancy, unspecified, unspecified trimester: Secondary | ICD-10-CM

## 2020-03-05 NOTE — Progress Notes (Signed)
   PRENATAL VISIT NOTE  Subjective:  Erica Walton is a 24 y.o. [id number] at [redacted]w[redacted]d being seen today for ongoing prenatal care.  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  Patient reports no complaints.  Contractions: Irritability. Vag. Bleeding: None.  Movement: Present. Denies leaking of fluid.   The following portions of the patient's history were reviewed and updated as appropriate: allergies, current medications, past family history, past medical history, past social history, past surgical history and problem list.   Objective:   Vitals:   03/05/20 1621  BP: 119/82  Pulse: (!) 120  Weight: 171 lb (77.6 kg)    Fetal Status: Fetal Heart Rate (bpm): 130 Fundal Height: 34 cm Movement: Present     General:  Alert, oriented and cooperative. Patient is in no acute distress.  Skin: Skin is warm and dry. No rash noted.   Cardiovascular: Normal heart rate noted  Respiratory: Normal respiratory effort, no problems with respiration noted  Abdomen: Soft, gravid, appropriate for gestational age.  Pain/Pressure: Present     Pelvic: Cervical exam deferred        Extremities: Normal range of motion.  Edema: None  Mental Status: Normal mood and affect. Normal behavior. Normal judgment and thought content.   Assessment and Plan:  Pregnancy: G5P4004 at [redacted]w[redacted]d 1. 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 Supervision of high risk pregnancy, antepartum 34 wks Continue prenatal care. Cultures next visit. Has u/s for growth next week.   3. Herpes On Valtrex  4. Human immunodeficiency virus (HIV) disease (HCC) On Tivicay and Truvada--undetectable  VL - T-helper cell (CD4)- ([facility] clinic only)  Preterm labor symptoms and general obstetric precautions including but not limited to vaginal bleeding, contractions, leaking of fluid and fetal movement were reviewed in detail with the patient. Please refer to After Visit Summary for other counseling recommendations.   Return in 2 weeks (on 03/19/2020).  Future Appointments  Date Time Provider Department Center  03/13/2020  3:15 PM [facility] NURSE [facility]  03/13/2020  3:30 PM [facility] US3 [facility]  03/20/2020  3:15 PM Federico Flake, MD [facility]    Reva Bores, MD

## 2020-03-05 NOTE — Patient Instructions (Signed)

## 2020-03-06 DIAGNOSIS — O98719 Human immunodeficiency virus [HIV] disease complicating pregnancy, unspecified trimester: Secondary | ICD-10-CM | POA: Diagnosis not present

## 2020-03-06 DIAGNOSIS — O0990 Supervision of high risk pregnancy, unspecified, unspecified trimester: Secondary | ICD-10-CM | POA: Diagnosis not present

## 2020-03-07 LAB — HIV-1 RNA QUANT-NO REFLEX-BLD: HIV-1 RNA Viral Load: <20 copies/mL

## 2020-03-13 ENCOUNTER — Encounter: Payer: Self-pay | Admitting: *Deleted

## 2020-03-13 ENCOUNTER — Ambulatory Visit: Payer: Medicaid Other | Attending: Obstetrics and Gynecology

## 2020-03-13 ENCOUNTER — Ambulatory Visit: Payer: Medicaid Other | Admitting: *Deleted

## 2020-03-13 ENCOUNTER — Other Ambulatory Visit: Payer: Self-pay

## 2020-03-13 DIAGNOSIS — Z3A35 35 weeks gestation of pregnancy: Secondary | ICD-10-CM

## 2020-03-13 DIAGNOSIS — O99513 Diseases of the respiratory system complicating pregnancy, third trimester: Secondary | ICD-10-CM | POA: Diagnosis not present

## 2020-03-13 DIAGNOSIS — Z21 Asymptomatic human immunodeficiency virus [HIV] infection status: Secondary | ICD-10-CM

## 2020-03-13 DIAGNOSIS — B009 Herpesviral infection, unspecified: Secondary | ICD-10-CM | POA: Diagnosis not present

## 2020-03-13 DIAGNOSIS — O98713 Human immunodeficiency virus [HIV] disease complicating pregnancy, third trimester: Secondary | ICD-10-CM | POA: Diagnosis not present

## 2020-03-13 DIAGNOSIS — O98719 Human immunodeficiency virus [HIV] disease complicating pregnancy, unspecified trimester: Secondary | ICD-10-CM

## 2020-03-13 DIAGNOSIS — O98513 Other viral diseases complicating pregnancy, third trimester: Secondary | ICD-10-CM

## 2020-03-13 DIAGNOSIS — J45909 Unspecified asthma, uncomplicated: Secondary | ICD-10-CM | POA: Diagnosis not present

## 2020-03-13 DIAGNOSIS — O0990 Supervision of high risk pregnancy, unspecified, unspecified trimester: Secondary | ICD-10-CM | POA: Insufficient documentation

## 2020-03-13 DIAGNOSIS — Z362 Encounter for other antenatal screening follow-up: Secondary | ICD-10-CM

## 2020-03-15 DIAGNOSIS — Z3400 Encounter for supervision of normal first pregnancy, unspecified trimester: Secondary | ICD-10-CM | POA: Diagnosis not present

## 2020-03-20 ENCOUNTER — Other Ambulatory Visit: Payer: Self-pay

## 2020-03-20 ENCOUNTER — Ambulatory Visit (INDEPENDENT_AMBULATORY_CARE_PROVIDER_SITE_OTHER): Payer: Medicaid Other | Admitting: Family Medicine

## 2020-03-20 ENCOUNTER — Other Ambulatory Visit (HOSPITAL_COMMUNITY)
Admission: RE | Admit: 2020-03-20 | Discharge: 2020-03-20 | Disposition: A | Discharge status: Home / Self Care | Payer: Medicaid Other | Source: Ambulatory Visit | Attending: Family Medicine | Admitting: Family Medicine

## 2020-03-20 ENCOUNTER — Other Ambulatory Visit: Payer: Self-pay | Admitting: Family Medicine

## 2020-03-20 VITALS — BP 114/75 | HR 94 | Wt 172.0 lb

## 2020-03-20 DIAGNOSIS — O321XX0 Maternal care for breech presentation, not applicable or unspecified: Secondary | ICD-10-CM | POA: Insufficient documentation

## 2020-03-20 DIAGNOSIS — O98719 Human immunodeficiency virus [HIV] disease complicating pregnancy, unspecified trimester: Secondary | ICD-10-CM | POA: Diagnosis not present

## 2020-03-20 DIAGNOSIS — O0990 Supervision of high risk pregnancy, unspecified, unspecified trimester: Secondary | ICD-10-CM

## 2020-03-20 DIAGNOSIS — Z3A00 Weeks of gestation of pregnancy not specified: Secondary | ICD-10-CM | POA: Diagnosis not present

## 2020-03-20 DIAGNOSIS — O99213 Obesity complicating pregnancy, third trimester: Secondary | ICD-10-CM

## 2020-03-20 DIAGNOSIS — B20 Human immunodeficiency virus [HIV] disease: Secondary | ICD-10-CM | POA: Diagnosis not present

## 2020-03-20 NOTE — Patient Instructions (Signed)
THINGS TO TRY TO FLIP YOU BABY  1. Spinning babies [email or web address]   2. Moxibustion [email or web address]-- Phone: [phone] Address: [address], [place], [place] [address]  3. Acupuncture (see above)  4. Chiropractic care-- see a provider with pregnancy training (Dr. Alexander Mt at [facility] [place] specializes in women/pregnancy care)     Breech Birth A breech birth is when a baby is born with the buttocks or feet first. Most babies are in a head down (vertex) position when they are born. There are three types of breech babies:  When the baby's buttocks are showing first in the vagina (birth canal) with the legs bent at the knees and the feet down near the buttocks (complete breech).  When the baby's buttocks are showing first in the birth canal with the legs straight up and the feet at the baby's head (frank breech).  When one or both of the baby's feet are showing first in the birth canal along with the buttocks (footling breech). What are the health risks of having a breech birth? Having a breech birth increases the health risks to your baby. A breech birth may cause the following:  Umbilical cord prolapse. This is when the umbilical cord enters the birth canal ahead of the baby, before or during labor. This can cause the cord to become pinched or compressed as labor continues. This can reduce the flow of blood and oxygen to the baby.  The baby getting stuck in the birth canal, which can cause injury or, rarely, death.  Injury to the baby's nerves in the shoulder, arm, and hand (brachial plexus injury) when delivered. What increases the risk of having a breech baby? It is not known what causes your baby to be breech. However, you are more likely to have a breech baby if:  You have had a previous pregnancy.  You are having  more than one baby.  Your baby has certain birth (congenital) defects.  You have started your labor earlier than expected (premature labor).  You have problems with your uterus, such as a tumor or abnormally shaped uterus.  You have too much or not enough fluid surrounding the baby (amniotic fluid). How do I know if my baby is breech? There are no symptoms for you to know that your baby is breech. When you are close to your due date, your health care provider can tell if your baby is breech by doing:  An abdominal or vaginal (pelvic) exam.  An ultrasound. What can be done if my baby is breech?  Your health care provider may try to turn the baby in your uterus. He or she will use a procedure called external cephalic version (ECV). He or she will place both hands on your abdomen and gently and slowly turn the baby around. It is important to know that ECV can increase your chances of suddenly going into labor. For this reason, an ECV is only done toward the end of a healthy pregnancy. The baby may remain in this position, but sometimes he or she may turn back to the breech position. You and your health care provider will discuss if an ECV is recommended for you and your baby. How will I delivery my baby if he or she is breech? You and your health care provider will discuss the best way to deliver your baby. If your baby is breech, it is less likely that a vaginal delivery will be recommended due to the risks to  you and your baby. Your health care provider may recommend that you deliver your baby through a Cesarean section (C-section). A C-section is the surgical delivery of a baby through an incision in the abdomen and the uterus. Summary  A breech birth is when a baby is born with the buttocks or feet first.  Having a breech birth may increase the risks to your baby.  Your health care provider may try to turn your baby in your uterus using a procedure called an external cephalic version  (ECV).  If your baby cannot be turned to a head down position or if your baby remains in a breech position, your health care provider will make recommendations about the safest way to deliver your baby. This information is not intended to replace advice given to you by your health care provider. Make sure you discuss any questions you have with your health care provider. Document Revised: 03/19/2017 Document Reviewed: 12/31/2016 Elsevier Patient Education  2020 [employer].

## 2020-03-20 NOTE — Progress Notes (Signed)
   PRENATAL VISIT NOTE  Subjective:  Erica [facility] is a 24 y.o. [id number] at [redacted]w[redacted]d being seen today for ongoing prenatal care.  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  Patient reports backache.  Contractions: Irregular. Vag. Bleeding: None.  Movement: Present. Denies leaking of fluid.   The following portions of the patient's history were reviewed and updated as appropriate: allergies, current medications, past family history, past medical history, past social history, past surgical history and problem list.   Objective:   Vitals:   03/20/20 1534  BP: 114/75  Pulse: 94  Weight: 172 lb (78 kg)    Fetal Status: Fetal Heart Rate (bpm): 165   Movement: Present     General:  Alert, oriented and cooperative. Patient is in no acute distress.  Skin: Skin is warm and dry. No rash noted.   Cardiovascular: Normal heart rate noted  Respiratory: Normal respiratory effort, no problems with respiration noted  Abdomen: Soft, gravid, appropriate for gestational age.  Pain/Pressure: Present     Pelvic: Cervical exam performed in the presence of a chaperone        Extremities: Normal range of motion.  Edema: None  Mental Status: Normal mood and affect. Normal behavior. Normal judgment and thought content.   Assessment and Plan:  Pregnancy: G5P4004 at [redacted]w[redacted]d 1. Supervision of high risk pregnancy, antepartum Up to date - Strep Gp B Culture+Rflx - GC/Chlamydia probe amp ([place])not at [facility]  2. 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   3. Obesity affecting pregnancy in third trimester TWG=14 lb (6.35 kg)   4. Breech  presentation, single or unspecified fetus ECV requested for 12/5 with Dr. Alysia Penna at [redacted]w[redacted]d-- originally tried to have with Dr. Shawnie Pons 12/9-[redacted]w[redacted]d (which would have been after 12/7 appt with Dr. Shawnie Pons) Given information about acupuncture, moxibustion, spinning babies Reviewed recommendation for delivery at 39 wk by CS if not cephalic   Preterm labor symptoms and general obstetric precautions including but not limited to vaginal bleeding, contractions, leaking of fluid and fetal movement were reviewed in detail with the patient. Please refer to After Visit Summary for other counseling recommendations.   Return in about 1 week (around 03/27/2020) for Routine prenatal care- with PRATT if possible.  Future Appointments  Date Time Provider Department Center  03/26/2020  4:00 PM Reva Bores, MD [facility]  04/01/2020  4:15 PM Anyanwu, Jethro Bastos, MD [facility]  04/11/2020  4:00 PM Anyanwu, Jethro Bastos, MD [facility]    Federico Flake, MD

## 2020-03-21 LAB — T-HELPER CELLS (CD4) COUNT (NOT AT ARMC)
% CD 4 Pos. Lymph.: 40.6 % (ref 30.8–58.5)
Absolute CD 4 Helper: 812 /uL (ref 359–1519)
Basophils Absolute: 0 10*3/uL (ref 0.0–0.2)
Basos: 0 %
EOS (ABSOLUTE): 0.3 10*3/uL (ref 0.0–0.4)
Eos: 3 %
Hematocrit: 32.2 % — ABNORMAL LOW (ref 34.0–46.6)
Hemoglobin: 10.4 g/dL — ABNORMAL LOW (ref 11.1–15.9)
Immature Grans (Abs): 0 10*3/uL (ref 0.0–0.1)
Immature Granulocytes: 0 %
Lymphocytes Absolute: 2 10*3/uL (ref 0.7–3.1)
Lymphs: 20 %
MCH: 23.7 pg — ABNORMAL LOW (ref 26.6–33.0)
MCHC: 32.3 g/dL (ref 31.5–35.7)
MCV: 74 fL — ABNORMAL LOW (ref 79–97)
Monocytes Absolute: 0.5 10*3/uL (ref 0.1–0.9)
Monocytes: 6 %
Neutrophils Absolute: 6.9 10*3/uL (ref 1.4–7.0)
Neutrophils: 71 %
Platelets: 270 10*3/uL (ref 150–450)
RBC: 4.38 x10E6/uL (ref 3.77–5.28)
RDW: 15.2 % (ref 11.7–15.4)
WBC: 9.8 10*3/uL (ref 3.4–10.8)

## 2020-03-22 LAB — GC/CHLAMYDIA PROBE AMP (~~LOC~~) NOT AT ARMC
Chlamydia: NEGATIVE
Comment: NEGATIVE
Comment: NORMAL
Neisseria Gonorrhea: NEGATIVE

## 2020-03-24 LAB — STREP GP B CULTURE+RFLX: Strep Gp B Culture+Rflx: NEGATIVE

## 2020-03-25 ENCOUNTER — Encounter: Payer: Self-pay | Admitting: *Deleted

## 2020-03-26 ENCOUNTER — Other Ambulatory Visit (HOSPITAL_COMMUNITY): Payer: Medicaid Other | Attending: Family Medicine

## 2020-03-26 ENCOUNTER — Ambulatory Visit (INDEPENDENT_AMBULATORY_CARE_PROVIDER_SITE_OTHER): Payer: Medicaid Other | Admitting: Family Medicine

## 2020-03-26 ENCOUNTER — Other Ambulatory Visit: Payer: Self-pay

## 2020-03-26 VITALS — BP 109/76 | HR 112 | Wt 175.0 lb

## 2020-03-26 DIAGNOSIS — O0990 Supervision of high risk pregnancy, unspecified, unspecified trimester: Secondary | ICD-10-CM

## 2020-03-26 DIAGNOSIS — O98719 Human immunodeficiency virus [HIV] disease complicating pregnancy, unspecified trimester: Secondary | ICD-10-CM

## 2020-03-26 NOTE — Progress Notes (Signed)
   PRENATAL VISIT NOTE  Subjective:  Erica VALLELY is a 24 y.o. T3M4680 at [redacted]w[redacted]d being seen today for ongoing prenatal care.  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  Patient reports no complaints.  Contractions: Irregular. Vag. Bleeding: None.  Movement: Present. Denies leaking of fluid.   The following portions of the patient's history were reviewed and updated as appropriate: allergies, current medications, past family history, past medical history, past social history, past surgical history and problem list.   Objective:   Vitals:   03/26/20 1625  BP: 109/76  Pulse: (!) 112  Weight: 175 lb (79.4 kg)    Fetal Status: Fetal Heart Rate (bpm): 150 Fundal Height: 34 cm Movement: Present  Presentation: Vertex  General:  Alert, oriented and cooperative. Patient is in no acute distress.  Skin: Skin is warm and dry. No rash noted.   Cardiovascular: Normal heart rate noted  Respiratory: Normal respiratory effort, no problems with respiration noted  Abdomen: Soft, gravid, appropriate for gestational age.  Pain/Pressure: Present     Pelvic: Cervical exam performed in the presence of a chaperone Dilation: 1 Effacement (%): Thick Station: Ballotable  Extremities: Normal range of motion.  Edema: None  Mental Status: Normal mood and affect. Normal behavior. Normal judgment and thought content.   Assessment and Plan:  Pregnancy: G5P4004 at [redacted]w[redacted]d 1. Supervision of high risk pregnancy, antepartum Continue prenatal care.   2. 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 Please refer to After Visit Summary for other counseling recommendations.   Return in 1 week (on 04/02/2020).  Future Appointments  Date Time Provider Department Center  04/01/2020  4:15 PM Anyanwu, Jethro Bastos, MD [facility]  04/11/2020  4:00 PM Anyanwu, Jethro Bastos, MD [facility]    Reva Bores, MD

## 2020-03-28 ENCOUNTER — Observation Stay (HOSPITAL_COMMUNITY): Admission: RE | Admit: 2020-03-28 | Payer: Medicaid Other | Source: Ambulatory Visit | Admitting: Family Medicine

## 2020-04-01 ENCOUNTER — Ambulatory Visit (INDEPENDENT_AMBULATORY_CARE_PROVIDER_SITE_OTHER): Payer: Medicaid Other | Admitting: Obstetrics & Gynecology

## 2020-04-01 ENCOUNTER — Encounter: Payer: Medicaid Other | Admitting: Obstetrics & Gynecology

## 2020-04-01 ENCOUNTER — Other Ambulatory Visit: Payer: Self-pay

## 2020-04-01 VITALS — BP 117/79 | HR 98 | Wt 176.0 lb

## 2020-04-01 DIAGNOSIS — Z3A38 38 weeks gestation of pregnancy: Secondary | ICD-10-CM

## 2020-04-01 DIAGNOSIS — O98719 Human immunodeficiency virus [HIV] disease complicating pregnancy, unspecified trimester: Secondary | ICD-10-CM

## 2020-04-01 DIAGNOSIS — O0990 Supervision of high risk pregnancy, unspecified, unspecified trimester: Secondary | ICD-10-CM

## 2020-04-01 NOTE — Patient Instructions (Signed)

## 2020-04-01 NOTE — Progress Notes (Signed)
PRENATAL VISIT NOTE  Subjective:  Erica [facility] is a 24 y.o. C7E9381 at [redacted]w[redacted]d being seen today for ongoing prenatal care.  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  Patient reports occasional contractions.  Contractions: Irregular. Vag. Bleeding: None.  Movement: Absent. Denies leaking of fluid.   The following portions of the patient's history were reviewed and updated as appropriate: allergies, current medications, past family history, past medical history, past social history, past surgical history and problem list.   Objective:   Vitals:   04/01/20 1550  BP: 117/79  Pulse: 98  Weight: 176 lb (79.8 kg)    Fetal Status: Fetal Heart Rate (bpm): 143 Fundal Height: 38 cm Movement: Absent  Presentation: Vertex  General:  Alert, oriented and cooperative. Patient is in no acute distress.  Skin: Skin is warm and dry. No rash noted.   Cardiovascular: Normal heart rate noted  Respiratory: Normal respiratory effort, no problems with respiration noted  Abdomen: Soft, gravid, appropriate for gestational age.  Pain/Pressure: Present     Pelvic: Cervical exam performed in the presence of a chaperone Dilation: 2 Effacement (%): Thick Station: Ballotable  Extremities: Normal range of motion.  Edema: None  Mental Status: Normal mood and affect. Normal behavior. Normal judgment and thought content.   Imaging: [place] MFM OB FOLLOW UP  Result Date: 03/13/2020 ----------------------------------------------------------------------  OBSTETRICS REPORT                       (Signed Final 03/13/2020 04:53 pm) ---------------------------------------------------------------------- Patient Info  ID #:        [id number]                          D.O.B.:  11-05-1995 (24 yrs)  Name:       Erica [facility]             Visit Date: 03/13/2020 04:12 pm ---------------------------------------------------------------------- Performed By  Attending:        Lin Landsman      Ref. Address:     [address]                    [address]                                                             [address]  Performed By:     Eden Lathe BS      Location:         [facility]  Referred By:      [facility] ---------------------------------------------------------------------- Orders  #  Description                           Code        Ordered By  1  [place] MFM OB FOLLOW UP                   [phone]    Rosana Hoes ----------------------------------------------------------------------  #  Order #                     Accession #                Episode #  1  [id number]                   [id number]                 [id number] ---------------------------------------------------------------------- Indications  Encounter for other antenatal screening        Z36.2  follow-up  HIV affecting pregnancy, third trimester       O98.713  (Truvada, Tivicay)  Herpes simplex virus (HSV)                     O98.519 B00.9  Asthma                                         O99.89 j45.909  [redacted] weeks gestation of pregnancy                Z3A.35 ---------------------------------------------------------------------- Fetal Evaluation  Num Of Fetuses:         1  Fetal Heart Rate(bpm):  129  Cardiac Activity:       Observed  Presentation:           Breech  Placenta:               Anterior  P. Cord Insertion:      Previously Visualized  Amniotic Fluid  AFI FV:      Within normal limits  AFI Sum(cm)     %Tile       Largest Pocket(cm)  15.             55           5.2  RUQ(cm)       RLQ(cm)       LUQ(cm)        LLQ(cm)  5.2           3.1           4.8            1.9 ---------------------------------------------------------------------- Biometry  BPD:      90.3  mm     G. Age:  36w 4d         82  %    CI:        72.14   %    70 -  86                                                          FL/HC:      19.4   %    20.1 - 22.1  HC:      338.3  mm     G. Age:  38w 6d         90  %    HC/AC:      1.01        0.93 - 1.11  AC:      334.2  mm     G. Age:  37w 2d         94  %    FL/BPD:     72.5   %    71 - 87  FL:       65.5  mm     G. Age:  33w 5d          8  %    FL/AC:      19.6   %    20 - 24  HUM:      58.8  mm     G. Age:  34w 0d         37  %  LV:       16.3  mm  Est. FW:    2964  gm      6 lb 9 oz     76  % ---------------------------------------------------------------------- OB History  Gravidity:    5         Term:   4        Prem:   0        SAB:   0  TOP:          0       Ectopic:  0        Living: 4 ---------------------------------------------------------------------- Gestational Age  U/S Today:     36w 4d                                        EDD:   04/06/20  Best:          35w 4d     Det. By:  Previous Ultrasound      EDD:   04/13/20 ---------------------------------------------------------------------- Anatomy  Cranium:               Appears normal         LVOT:                   Previously seen  Cavum:                 Appears normal         Aortic Arch:            Previously seen  Ventricles:            Abnormal, see          Ductal Arch:            Previously seen  comments  Choroid Plexus:        Appears normal         Diaphragm:              Appears normal  Cerebellum:            Appears normal         Stomach:                Appears normal, left                                                                        sided  Posterior Fossa:       Appears normal         Abdomen:                Previously seen  Nuchal Fold:            Previously seen        Abdominal Wall:         Previously seen  Face:                  Appears normal         Cord Vessels:           Previously seen                         (orbits and profile)  Lips:                  Appears normal         Kidneys:                Appear normal  Palate:                Previously seen        Bladder:                Appears normal  Thoracic:              Appears normal         Spine:                  Previously seen  Heart:                 Previously seen        Upper Extremities:      Previously seen  RVOT:                  Previously seen        Lower Extremities:      Previously seen ---------------------------------------------------------------------- Cervix Uterus Adnexa  Cervix  Length:           3.12  cm.  Normal appearance by transabdominal scan.  Uterus  No abnormality visualized.  Right Ovary  Not visualized.  Left Ovary  Not visualized.  Cul De Sac  No free fluid seen.  Adnexa  No abnormality visualized. ---------------------------------------------------------------------- Impression  Erica [facility] is here for follow up growth due to HIV and late  preterm diagnosis of mild ventriculomegaly. She has a low  risk NIPS.  Today there was normal interval growth with measurements  consistent with dates.  Ventriculmegaly remains mild but measures at 1.1 cm  (previously 1.0 cm).  There was good amnitoic fluid and fetal movement.  Recent viral load was < 20  (11/17) ---------------------------------------------------------------------- Recommendations  Consider delivery between 39-40 weeks.  Postnatal evaluation ----------------------------------------------------------------------               Lin Landsman, MD Electronically Signed Final Report   03/13/2020 04:53 pm ----------------------------------------------------------------------   Assessment and Plan:  Pregnancy: D1S9702 at [redacted]w[redacted]d 1. 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 Continue Tivicay and Truvada. Will get AZT prophylaxis infusion during labor (confirm with ID given VL being undetectable status).   2. [redacted] weeks gestation of pregnancy 3. Supervision of high risk pregnancy, antepartum Discussed IOL at 39 weeks.  Risks and benefits of induction were reviewed.  All questions answered.  Induction of labor scheduled, orders have been signed and held. Offered outpatient foley placement for cervical ripening, she declined this. She was told to expect a call from [facility] with further instructions about her pre-admission COVID screening and any further instructions about the induction of labor. Scheduled time slot was midnight on 11/18/2, she will arrive 11:45 pm on 04/05/20.   Labor symptoms and general obstetric precautions including but not limited to vaginal bleeding, contractions, leaking of fluid and fetal movement were reviewed in detail with the patient. Please refer to After Visit Summary for other counseling recommendations.   Return for Postpartum check.  Future Appointments  Date Time Provider Department [place]  04/06/2020 12:00 AM [facility] SCHED ROOM [facility] None  04/11/2020  4:00 PM Kenny Rea, Jethro Bastos, MD [facility]    Jaynie Collins, MD

## 2020-04-02 ENCOUNTER — Other Ambulatory Visit: Payer: Self-pay | Admitting: Advanced Practice Midwife

## 2020-04-02 ENCOUNTER — Telehealth (HOSPITAL_COMMUNITY): Payer: Self-pay | Admitting: *Deleted

## 2020-04-02 NOTE — Telephone Encounter (Signed)
Preadmission screen  

## 2020-04-04 ENCOUNTER — Other Ambulatory Visit (HOSPITAL_COMMUNITY)
Admission: RE | Admit: 2020-04-04 | Discharge: 2020-04-04 | Disposition: A | Discharge status: Home / Self Care | Payer: Medicaid Other | Source: Ambulatory Visit | Attending: Family Medicine | Admitting: Family Medicine

## 2020-04-04 DIAGNOSIS — Z01812 Encounter for preprocedural laboratory examination: Secondary | ICD-10-CM | POA: Diagnosis not present

## 2020-04-04 DIAGNOSIS — Z20822 Contact with and (suspected) exposure to covid-19: Secondary | ICD-10-CM | POA: Diagnosis not present

## 2020-04-04 LAB — SARS CORONAVIRUS 2 (TAT 6-24 HRS): SARS Coronavirus 2: NEGATIVE

## 2020-04-06 ENCOUNTER — Inpatient Hospital Stay (HOSPITAL_COMMUNITY): Payer: Medicaid Other | Admitting: Anesthesiology

## 2020-04-06 ENCOUNTER — Other Ambulatory Visit: Payer: Self-pay

## 2020-04-06 ENCOUNTER — Encounter (HOSPITAL_COMMUNITY): Payer: Self-pay | Admitting: Obstetrics & Gynecology

## 2020-04-06 ENCOUNTER — Inpatient Hospital Stay (HOSPITAL_COMMUNITY)
Admission: AD | Admit: 2020-04-06 | Discharge: 2020-04-08 | DRG: 806 | Disposition: A | Discharge status: Home / Self Care | Payer: Medicaid Other | Attending: Obstetrics & Gynecology | Admitting: Obstetrics & Gynecology

## 2020-04-06 ENCOUNTER — Inpatient Hospital Stay (HOSPITAL_COMMUNITY): Payer: Medicaid Other

## 2020-04-06 DIAGNOSIS — O9832 Other infections with a predominantly sexual mode of transmission complicating childbirth: Secondary | ICD-10-CM | POA: Diagnosis present

## 2020-04-06 DIAGNOSIS — Z880 Allergy status to penicillin: Secondary | ICD-10-CM | POA: Diagnosis not present

## 2020-04-06 DIAGNOSIS — E669 Obesity, unspecified: Secondary | ICD-10-CM | POA: Diagnosis not present

## 2020-04-06 DIAGNOSIS — O99214 Obesity complicating childbirth: Secondary | ICD-10-CM | POA: Diagnosis not present

## 2020-04-06 DIAGNOSIS — O98719 Human immunodeficiency virus [HIV] disease complicating pregnancy, unspecified trimester: Secondary | ICD-10-CM | POA: Diagnosis present

## 2020-04-06 DIAGNOSIS — O0990 Supervision of high risk pregnancy, unspecified, unspecified trimester: Secondary | ICD-10-CM

## 2020-04-06 DIAGNOSIS — O99210 Obesity complicating pregnancy, unspecified trimester: Secondary | ICD-10-CM | POA: Diagnosis present

## 2020-04-06 DIAGNOSIS — O9872 Human immunodeficiency virus [HIV] disease complicating childbirth: Principal | ICD-10-CM | POA: Diagnosis present

## 2020-04-06 DIAGNOSIS — B20 Human immunodeficiency virus [HIV] disease: Secondary | ICD-10-CM | POA: Diagnosis present

## 2020-04-06 DIAGNOSIS — J45909 Unspecified asthma, uncomplicated: Secondary | ICD-10-CM | POA: Diagnosis present

## 2020-04-06 DIAGNOSIS — O9952 Diseases of the respiratory system complicating childbirth: Secondary | ICD-10-CM | POA: Diagnosis present

## 2020-04-06 DIAGNOSIS — O9873 Human immunodeficiency virus [HIV] disease complicating the puerperium: Secondary | ICD-10-CM | POA: Diagnosis not present

## 2020-04-06 DIAGNOSIS — Z9889 Other specified postprocedural states: Secondary | ICD-10-CM | POA: Diagnosis not present

## 2020-04-06 DIAGNOSIS — B009 Herpesviral infection, unspecified: Secondary | ICD-10-CM | POA: Diagnosis present

## 2020-04-06 DIAGNOSIS — O26893 Other specified pregnancy related conditions, third trimester: Secondary | ICD-10-CM | POA: Diagnosis not present

## 2020-04-06 DIAGNOSIS — Z3A39 39 weeks gestation of pregnancy: Secondary | ICD-10-CM | POA: Diagnosis not present

## 2020-04-06 LAB — COMPREHENSIVE METABOLIC PANEL
ALT: 13 U/L (ref 0–44)
AST: 22 U/L (ref 15–41)
Albumin: 2.8 g/dL — ABNORMAL LOW (ref 3.5–5.0)
Alkaline Phosphatase: 132 U/L — ABNORMAL HIGH (ref 38–126)
Anion gap: 11 (ref 5–15)
BUN: 9 mg/dL (ref 6–20)
CO2: 19 mmol/L — ABNORMAL LOW (ref 22–32)
Calcium: 10 mg/dL (ref 8.9–10.3)
Chloride: 106 mmol/L (ref 98–111)
Creatinine, Ser: 0.54 mg/dL (ref 0.44–1.00)
GFR, Estimated: >60 mL/min (ref >60)
Glucose, Bld: 80 mg/dL (ref 70–99)
Potassium: 4.5 mmol/L (ref 3.5–5.1)
Sodium: 136 mmol/L (ref 135–145)
Total Bilirubin: 0.2 mg/dL — ABNORMAL LOW (ref 0.3–1.2)
Total Protein: 5.7 g/dL — ABNORMAL LOW (ref 6.5–8.1)

## 2020-04-06 LAB — CBC
HCT: 33.7 % — ABNORMAL LOW (ref 36.0–46.0)
Hemoglobin: 10 g/dL — ABNORMAL LOW (ref 12.0–15.0)
MCH: 22.8 pg — ABNORMAL LOW (ref 26.0–34.0)
MCHC: 29.7 g/dL — ABNORMAL LOW (ref 30.0–36.0)
MCV: 76.9 fL — ABNORMAL LOW (ref 80.0–100.0)
Platelets: 258 10*3/uL (ref 150–400)
RBC: 4.38 MIL/uL (ref 3.87–5.11)
RDW: 16.6 % — ABNORMAL HIGH (ref 11.5–15.5)
WBC: 10.4 10*3/uL (ref 4.0–10.5)
nRBC: 0 % (ref 0.0–0.2)

## 2020-04-06 LAB — RPR: RPR Ser Ql: NONREACTIVE

## 2020-04-06 LAB — TYPE AND SCREEN
ABO/RH(D): B POS
Antibody Screen: NEGATIVE

## 2020-04-06 MED ORDER — TERBUTALINE SULFATE 1 MG/ML IJ SOLN: 0.2500 mg | Freq: Once | INTRAMUSCULAR | Status: DC | PRN

## 2020-04-06 MED ORDER — OXYTOCIN-SODIUM CHLORIDE 30-0.9 UT/500ML-% IV SOLN: 1.0000 m[IU]/min | INTRAVENOUS | Status: DC

## 2020-04-06 MED ORDER — SIMETHICONE 80 MG PO CHEW: 80.0000 mg | CHEWABLE_TABLET | ORAL | Status: DC | PRN

## 2020-04-06 MED ORDER — ZOLPIDEM TARTRATE 5 MG PO TABS: 5.0000 mg | ORAL_TABLET | Freq: Every evening | ORAL | Status: DC | PRN

## 2020-04-06 MED ORDER — MISOPROSTOL 50MCG HALF TABLET: 50.0000 ug | ORAL_TABLET | Freq: Once | ORAL | Status: AC

## 2020-04-06 MED ORDER — FLEET ENEMA 7-19 GM/118ML RE ENEM: 1.0000 | ENEMA | Freq: Every day | RECTAL | Status: DC | PRN

## 2020-04-06 MED ORDER — ONDANSETRON HCL 4 MG/2ML IJ SOLN: 4.0000 mg | Freq: Four times a day (QID) | INTRAMUSCULAR | Status: DC | PRN

## 2020-04-06 MED ORDER — OXYTOCIN BOLUS FROM INFUSION
333.0000 mL | Freq: Once | INTRAVENOUS | Status: AC
  Administered 2020-04-06: 13:00:00 333 mL via INTRAVENOUS

## 2020-04-06 MED ORDER — PHENYLEPHRINE 40 MCG/ML (10ML) SYRINGE FOR IV PUSH (FOR BLOOD PRESSURE SUPPORT): 80.0000 ug | PREFILLED_SYRINGE | INTRAVENOUS | Status: DC | PRN

## 2020-04-06 MED ORDER — COCONUT OIL OIL
1.0000 "application " | TOPICAL_OIL | Status: DC | PRN
  Administered 2020-04-07: 1 via TOPICAL

## 2020-04-06 MED ORDER — MEASLES, MUMPS & RUBELLA VAC IJ SOLR: 0.5000 mL | Freq: Once | INTRAMUSCULAR | Status: DC

## 2020-04-06 MED ORDER — OXYCODONE-ACETAMINOPHEN 5-325 MG PO TABS: 1.0000 | ORAL_TABLET | ORAL | Status: DC | PRN

## 2020-04-06 MED ORDER — TETANUS-DIPHTH-ACELL PERTUSSIS 5-2.5-18.5 LF-MCG/0.5 IM SUSY: 0.5000 mL | PREFILLED_SYRINGE | Freq: Once | INTRAMUSCULAR | Status: DC

## 2020-04-06 MED ORDER — IBUPROFEN 600 MG PO TABS
600.0000 mg | ORAL_TABLET | Freq: Four times a day (QID) | ORAL | Status: DC | PRN
  Administered 2020-04-06 – 2020-04-08 (×5): 600 mg via ORAL
  Filled 2020-04-06 (×5): qty 1

## 2020-04-06 MED ORDER — OXYCODONE-ACETAMINOPHEN 5-325 MG PO TABS: 2.0000 | ORAL_TABLET | ORAL | Status: DC | PRN

## 2020-04-06 MED ORDER — ZIDOVUDINE 10 MG/ML IV SOLN
2.0000 mg/kg | Freq: Once | INTRAVENOUS | Status: AC
  Administered 2020-04-06: 09:00:00 160 mg via INTRAVENOUS
  Filled 2020-04-06: qty 16

## 2020-04-06 MED ORDER — ACETAMINOPHEN 325 MG PO TABS: 650.0000 mg | ORAL_TABLET | ORAL | Status: DC | PRN

## 2020-04-06 MED ORDER — DOLUTEGRAVIR SODIUM 50 MG PO TABS
50.0000 mg | ORAL_TABLET | Freq: Every day | ORAL | Status: DC
  Administered 2020-04-07 – 2020-04-08 (×2): 50 mg via ORAL
  Filled 2020-04-06 (×3): qty 1

## 2020-04-06 MED ORDER — SENNOSIDES-DOCUSATE SODIUM 8.6-50 MG PO TABS
2.0000 | ORAL_TABLET | ORAL | Status: DC
  Administered 2020-04-06 – 2020-04-07 (×2): 2 via ORAL
  Filled 2020-04-06 (×2): qty 2

## 2020-04-06 MED ORDER — HYDROXYZINE HCL 50 MG PO TABS: 50.0000 mg | ORAL_TABLET | Freq: Four times a day (QID) | ORAL | Status: DC | PRN

## 2020-04-06 MED ORDER — DIBUCAINE (PERIANAL) 1 % EX OINT: 1.0000 "application " | TOPICAL_OINTMENT | CUTANEOUS | Status: DC | PRN

## 2020-04-06 MED ORDER — LIDOCAINE HCL (PF) 1 % IJ SOLN: 30.0000 mL | INTRAMUSCULAR | Status: DC | PRN

## 2020-04-06 MED ORDER — OXYTOCIN-SODIUM CHLORIDE 30-0.9 UT/500ML-% IV SOLN
2.5000 [IU]/h | INTRAVENOUS | Status: DC
  Administered 2020-04-06: 14:00:00 2.5 [IU]/h via INTRAVENOUS
  Filled 2020-04-06: qty 500

## 2020-04-06 MED ORDER — ONDANSETRON HCL 4 MG PO TABS: 4.0000 mg | ORAL_TABLET | ORAL | Status: DC | PRN

## 2020-04-06 MED ORDER — ZIDOVUDINE 10 MG/ML IV SOLN
1.0000 mg/kg/h | INTRAVENOUS | Status: DC
  Administered 2020-04-06: 11:00:00 1 mg/kg/h via INTRAVENOUS
  Filled 2020-04-06 (×2): qty 40

## 2020-04-06 MED ORDER — FENTANYL-BUPIVACAINE-NACL 0.5-0.125-0.9 MG/250ML-% EP SOLN: 12.0000 mL/h | EPIDURAL | Status: DC | PRN

## 2020-04-06 MED ORDER — MISOPROSTOL 25 MCG QUARTER TABLET: 25.0000 ug | ORAL_TABLET | ORAL | Status: DC | PRN

## 2020-04-06 MED ORDER — ONDANSETRON HCL 4 MG/2ML IJ SOLN: 4.0000 mg | INTRAMUSCULAR | Status: DC | PRN

## 2020-04-06 MED ORDER — LACTATED RINGERS IV SOLN: INTRAVENOUS | Status: DC

## 2020-04-06 MED ORDER — ACETAMINOPHEN 325 MG PO TABS
650.0000 mg | ORAL_TABLET | ORAL | Status: DC
  Administered 2020-04-06 – 2020-04-08 (×9): 650 mg via ORAL
  Filled 2020-04-06 (×9): qty 2

## 2020-04-06 MED ORDER — SODIUM CHLORIDE (PF) 0.9 % IJ SOLN
INTRAMUSCULAR | Status: DC | PRN
  Administered 2020-04-06: 12 mL/h via EPIDURAL

## 2020-04-06 MED ORDER — PRENATAL MULTIVITAMIN CH
1.0000 | ORAL_TABLET | Freq: Every day | ORAL | Status: DC
  Administered 2020-04-07 – 2020-04-08 (×2): 1 via ORAL
  Filled 2020-04-06 (×2): qty 1

## 2020-04-06 MED ORDER — LIDOCAINE HCL (PF) 1 % IJ SOLN
INTRAMUSCULAR | Status: DC | PRN
  Administered 2020-04-06: 7 mL via EPIDURAL

## 2020-04-06 MED ORDER — MISOPROSTOL 50MCG HALF TABLET
ORAL_TABLET | ORAL | Status: AC
  Administered 2020-04-06: 09:00:00 50 ug via BUCCAL
  Filled 2020-04-06: qty 1

## 2020-04-06 MED ORDER — EMTRICITABINE-TENOFOVIR DF 200-300 MG PO TABS
1.0000 | ORAL_TABLET | Freq: Every day | ORAL | Status: DC
  Administered 2020-04-07 – 2020-04-08 (×2): 1 via ORAL
  Filled 2020-04-06 (×3): qty 1

## 2020-04-06 MED ORDER — LACTATED RINGERS IV SOLN: 500.0000 mL | Freq: Once | INTRAVENOUS | Status: DC

## 2020-04-06 MED ORDER — EPHEDRINE 5 MG/ML INJ: 10.0000 mg | INTRAVENOUS | Status: DC | PRN

## 2020-04-06 MED ORDER — BENZOCAINE-MENTHOL 20-0.5 % EX AERO: 1.0000 "application " | INHALATION_SPRAY | CUTANEOUS | Status: DC | PRN

## 2020-04-06 MED ORDER — FENTANYL CITRATE (PF) 100 MCG/2ML IJ SOLN: 50.0000 ug | INTRAMUSCULAR | Status: DC | PRN

## 2020-04-06 MED ORDER — DIPHENHYDRAMINE HCL 50 MG/ML IJ SOLN
12.5000 mg | INTRAMUSCULAR | Status: DC | PRN
  Administered 2020-04-06: 15:00:00 12.5 mg via INTRAVENOUS
  Filled 2020-04-06: qty 1

## 2020-04-06 MED ORDER — WITCH HAZEL-GLYCERIN EX PADS: 1.0000 "application " | MEDICATED_PAD | CUTANEOUS | Status: DC | PRN

## 2020-04-06 MED ORDER — SOD CITRATE-CITRIC ACID 500-334 MG/5ML PO SOLN: 30.0000 mL | ORAL | Status: DC | PRN

## 2020-04-06 MED ORDER — DIPHENHYDRAMINE HCL 25 MG PO CAPS: 25.0000 mg | ORAL_CAPSULE | Freq: Four times a day (QID) | ORAL | Status: DC | PRN

## 2020-04-06 MED ORDER — LACTATED RINGERS IV SOLN
500.0000 mL | INTRAVENOUS | Status: DC | PRN
  Administered 2020-04-06: 09:00:00 500 mL via INTRAVENOUS
  Administered 2020-04-06: 12:00:00 1000 mL via INTRAVENOUS

## 2020-04-06 MED ORDER — FENTANYL-BUPIVACAINE-NACL 0.5-0.125-0.9 MG/250ML-% EP SOLN
EPIDURAL | Status: AC
  Filled 2020-04-06: qty 250

## 2020-04-06 NOTE — Anesthesia Preprocedure Evaluation (Signed)
Anesthesia Evaluation  Patient identified by MRN, date of birth, ID band Patient awake    Reviewed: Allergy & Precautions, NPO status , Patient's Chart, lab work & pertinent test results  Airway Mallampati: II  TM Distance: >3 FB Neck ROM: Full    Dental  (+) Teeth Intact   Pulmonary asthma ,    Pulmonary exam normal        Cardiovascular negative cardio ROS   Rhythm:Regular Rate:Normal     Neuro/Psych negative neurological ROS  negative psych ROS   GI/Hepatic negative GI ROS, Neg liver ROS,   Endo/Other  negative endocrine ROS  Renal/GU negative Renal ROS  negative genitourinary   Musculoskeletal negative musculoskeletal ROS (+)   Abdominal (+)  Abdomen: soft. Bowel sounds: normal.  Peds  Hematology  (+) HIV,   Anesthesia Other Findings   Reproductive/Obstetrics (+) Pregnancy                             Anesthesia Physical Anesthesia Plan  ASA: II  Anesthesia Plan: Epidural   Post-op Pain Management:    Induction:   PONV Risk Score and Plan: 2  Airway Management Planned: Natural Airway  Additional Equipment: None  Intra-op Plan:   Post-operative Plan:   Informed Consent: I have reviewed the patients History and Physical, chart, labs and discussed the procedure including the risks, benefits and alternatives for the proposed anesthesia with the patient or authorized representative who has indicated his/her understanding and acceptance.     Dental advisory given  Plan Discussed with: CRNA  Anesthesia Plan Comments: (Lab Results      Component                Value               Date                      WBC                      10.4                04/06/2020                HGB                      10.0 (L)            04/06/2020                HCT                      33.7 (L)            04/06/2020                MCV                      76.9 (L)             04/06/2020                PLT                      258                 04/06/2020           Lab Results  Component                Value               Date                      NA                       136                 04/06/2020                K                        4.5                 04/06/2020                CO2                      19 (L)              04/06/2020                GLUCOSE                  80                  04/06/2020                BUN                      9                   04/06/2020                CREATININE               0.54                04/06/2020                CALCIUM                  10.0                04/06/2020                GFRNONAA                 >60                 04/06/2020                GFRAA                    154                 12/01/2019          )        Anesthesia Quick Evaluation

## 2020-04-06 NOTE — Progress Notes (Signed)
During patient's initial assessment upon arriving to the unit, this RN and L&D RN Valarie Merino, RN)  noticed pt's cervix is visible and protruding at her vaginal opening. This RN called Dr. Germaine Pomfret to inform her of visible cervix at pt's vaginal opening. Per Dr. Germaine Pomfret, she is aware because patient had the same presentation after delivery and it is due to patient's multiple births and history. Per patient this same thing happened after both of her last deliveries. Patient states "one time my uterus actually fell out after we thought it was a blood clot." Dr. Germaine Pomfret states that there are no interventions that need to done at this time and to inform her if there seems to be anything hanging out of patient's vagina.

## 2020-04-06 NOTE — Discharge Summary (Addendum)
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 yo [id number] [id number] admitted to the hospital 04/06/2020 for induction of labor.  Indication for induction: Elective.  Patient had an uncomplicated labor course as follows: Membrane Rupture Time/Date: 10:26 AM ,04/06/2020   Delivery Method:Vaginal, Spontaneous  Episiotomy: None  Lacerations:  None  Details of delivery can be found in separate delivery note.  Patient had a routine postpartum course. Patient is discharged home 04/08/20.  Newborn Data: Birth date:04/06/2020  Birth time:1:15 PM  Gender:Female  Living status:Living  Apgars:9 ,9  Weight:3439 g   Magnesium Sulfate received: No BMZ received: No Rhophylac:N/A MMR:N/A T-DaP:Given prenatally Flu: Yes Transfusion:No  Physical exam  Vitals:   04/07/20 0500 04/07/20 1807 04/07/20 2029 04/08/20 0500  BP: 116/76 (!) 130/91 116/81 112/68  Pulse: 78 87 89 82  Resp: '18 19 15 14  ' Temp:  98.2 F (36.8 C) 98.6 F (37 C) 98.2 F (36.8 C)  TempSrc: Oral Oral Oral Oral  SpO2: 99%  99%  99%  Weight:      Height:       General: alert, cooperative and no distress Lochia: appropriate Uterine Fundus: firm Incision: N/A DVT Evaluation: No evidence of DVT seen on physical exam. Labs: Lab Results  Component Value Date   WBC 10.4 04/06/2020   HGB 10.0 (L) 04/06/2020   HCT 33.7 (L) 04/06/2020   MCV 76.9 (L) 04/06/2020   PLT 258 04/06/2020   CMP Latest Ref Rng & Units 04/06/2020  Glucose 70 - 99 mg/dL 80  BUN 6 - 20 mg/dL 9  Creatinine 0.44 - 1.00 mg/dL 0.54  Sodium 135 - 145 mmol/L 136  Potassium 3.5 - 5.1 mmol/L 4.5  Chloride 98 - 111 mmol/L 106  CO2 22 - 32 mmol/L 19(L)  Calcium 8.9 - 10.3 mg/dL 10.0  Total Protein 6.5 - 8.1 g/dL 5.7(L)  Total Bilirubin 0.3 - 1.2 mg/dL 0.2(L)  Alkaline Phos 38 - 126 U/L 132(H)  AST 15 - 41 U/L 22  ALT 0 - 44 U/L 13   Edinburgh Score: No flowsheet data found.   After visit meds:  Allergies as of 04/07/2020      Reactions   Norco [hydrocodone-acetaminophen] Rash   Penicillins Rash, Other (See Comments)   Has patient had a PCN reaction causing immediate rash, facial/tongue/throat swelling, SOB or lightheadedness with hypotension: yes, RaLFYB:[id number]}as patient had a PCN reaction causing severe rash involving  mucus membranes or skin necrosis: no:[id number]} Has patient had a PCN reaction that required hospitalization no:[id number]} Has patient had a PCN reaction occurring within the last 10 years: no:[id number]} If all of the above answers are "NO", then may proceed with Cephalosporin use.      Medication List    STOP taking these medications   aspirin EC 81 MG tablet     TAKE these medications   albuterol 108 (90 Base) MCG/ACT inhaler Commonly known as: VENTOLIN HFA Inhale 2 puffs into the lungs every 6 (six) hours as needed for wheezing or shortness of breath.   calcium carbonate 500 MG chewable tablet Commonly known as: TUMS - dosed in mg elemental calcium Chew 1 tablet by mouth daily.   emtricitabine-tenofovir  200-300 MG tablet Commonly known as: TRUVADA Take 1 tablet by mouth daily.   ibuprofen 600 MG tablet Commonly known as: ADVIL Take 1 tablet (600 mg total) by mouth every 6 (six) hours as needed for moderate pain.   multivitamin-prenatal 27-0.8 MG Tabs tablet Take 1 tablet by mouth daily at 12 noon.   Tivicay 50 MG tablet Generic drug: dolutegravir Take 1 tablet (50 mg total) by mouth daily.   valACYclovir 500 MG tablet Commonly known as: VALTREX Take 1 tablet (500 mg total) by mouth 2 (two) times daily.   Vitafol Gummies 3.33-0.333-34.8 MG Chew Chew 1 tablet by mouth daily.        Discharge home in stable condition Infant Feeding: Breast Infant Disposition:home with mother Discharge instruction: per After Visit Summary and Postpartum booklet. Activity: Advance as tolerated. Pelvic rest for 6 weeks.  Diet: routine diet Future Appointments: Future Appointments  Date Time Provider [place]  04/11/2020  4:00 PM Anyanwu, Sallyanne Havers, MD [facility]  05/02/2020  3:30 PM Alden Callas, NP [facility]   Follow up Visit:  [place] [facility] [place] [facility] Follow up.   Specialty: Obstetrics and Gynecology Why: In 4-6 weeks postpartum. Return to MAU as needed for emergencies. Contact information: [place] [phone]             Message sent to [facility] 04/06/20 by Sylvester Harder.   Please schedule this patient for a In person postpartum visit in 4 weeks with the following provider: Any provider. Additional Postpartum F/U:none  High risk pregnancy complicated by: HIV(undetectable viral load) Delivery mode:  Vaginal, Spontaneous  Anticipated Birth Control:  Unsure considering IUD   Mallie Snooks, MSN, CNM Certified Nurse Midwife, [employer] [facility] [employer], [place] [facility] 04/08/20 10:18 AM

## 2020-04-06 NOTE — Lactation Note (Signed)
This note was copied from a baby's chart. Lactation Consultation Note  Patient Name: Erica Walton [id number] Date: 04/06/2020 Reason for consult: Initial assessment (L&D initial consult)  L&D consult with 90 minutes old infant and P4 mother. Congratulated her on her newborn. Infant is breastfeeding cradle position, left breast. Discussed STS as ideal transition for infants after birth helping with temperature, blood sugar and comfort. Mother is am [facility] participant.  Explained LC services availability during postpartum stay. Thanked mother for her time.    Maternal Data Formula Feeding for Exclusion: No  Feeding Feeding Type: Breast Fed  LATCH Score Latch: Grasps breast easily, tongue down, lips flanged, rhythmical sucking.  Audible Swallowing: Spontaneous and intermittent  Type of Nipple: Everted at rest and after stimulation  Comfort (Breast/Nipple): Soft / non-tender  Hold (Positioning): No assistance needed to correctly position infant at breast.  LATCH Score: 10  Interventions Interventions: Breast feeding basics reviewed;Skin to skin  Lactation Tools Discussed/Used WIC Program: Yes   Consult Status Consult Status: Follow-up Date: 04/07/20 Follow-up type: In-patient    Erica Walton A Higuera Ancidey 04/06/2020, 2:03 PM

## 2020-04-06 NOTE — Discharge Instructions (Signed)

## 2020-04-06 NOTE — H&P (Signed)
OBSTETRIC ADMISSION HISTORY AND PHYSICAL  Erica Walton is a 24 y.o. female [phone] with IUP at [redacted]w[redacted]d by early u/s presenting for eIOL. She reports +FMs, No LOF, no VB, no blurry vision, headaches or peripheral edema, and RUQ pain.  She plans on breast feeding. She is undecided for birth control. She received her prenatal care at [facility]   Dating: By early u/s --->  Estimated Date of Delivery: 04/13/20  Sono:    03/13/20@[redacted]w[redacted]d , CWD, normal anatomy, breech presentation, 2964g, 76% EFW   Prenatal History/Complications:  HIV (on meds, undetectable viral load) HSV (on suppression) BMI 37 Hx asthma   Past Medical History: Past Medical History:  Diagnosis Date  . Asthma   . Genital herpes   . HIV (human immunodeficiency virus infection) (HCC)     Past Surgical History: Past Surgical History:  Procedure Laterality Date  . LAPAROSCOPIC APPENDECTOMY N/A 07/22/2019   Procedure: APPENDECTOMY LAPAROSCOPIC;  Surgeon: Carolan Shiver, MD;  Location: [facility] ORS;  Service: General;  Laterality: N/A;    Obstetrical History: OB History    Gravida  5   Para  4   Term  4   Preterm      AB      Living  4     SAB      IAB      Ectopic      Multiple      Live Births  4           Social History Social History   Socioeconomic History  . Marital status: Married    Spouse name: Marlinda Mike   . Number of children: 4  . Years of education: Not on file  . Highest education level: Not on file  Occupational History  . Not on file  Tobacco Use  . Smoking status: Never Smoker  . Smokeless tobacco: Never Used  Substance and Sexual Activity  . Alcohol use: Not Currently    Comment: 2018  . Drug use: Never  . Sexual activity: Yes    Birth control/protection: None  Other Topics Concern  . Not on file  Social History Narrative  . Not on file   Social Determinants of Health   Financial Resource Strain: Not on file  Food Insecurity: Not on file  Transportation  Needs: Not on file  Physical Activity: Not on file  Stress: Not on file  Social Connections: Not on file    Family History: Family History  Problem Relation Age of Onset  . Cancer Father   . Breast cancer Other     Allergies: Allergies  Allergen Reactions  . Norco [Hydrocodone-Acetaminophen] Rash  . Penicillins Rash and Other (See Comments)    Has patient had a PCN reaction causing immediate rash, facial/tongue/throat swelling, SOB or lightheadedness with hypotension: yes, WLNL:[id number]} Has patient had a PCN reaction causing severe rash involving mucus membranes or skin necrosis: no:[id number]} Has patient had a PCN reaction that required hospitalization no:[id number]} Has patient had a PCN reaction occurring within the last 10 years: no:[id number]} If all of the above answers are "NO", then may proceed with Cephalosporin use.     Medications Prior to Admission  Medication Sig Dispense Refill Last Dose  . aspirin EC 81 MG tablet Take 1 tablet (81 mg total) by mouth daily. Take after 12 weeks for prevention of preeclampsia later in pregnancy 300 tablet 2 Past Week at Unknown time  . calcium carbonate (TUMS - DOSED IN MG ELEMENTAL CALCIUM) 500 MG chewable tablet  Chew 1 tablet by mouth daily.    04/06/2020 at Unknown time  . dolutegravir (TIVICAY) 50 MG tablet Take 1 tablet (50 mg total) by mouth daily. 30 tablet 6 04/06/2020 at Unknown time  . emtricitabine-tenofovir (TRUVADA) 200-300 MG tablet Take 1 tablet by mouth daily. 30 tablet 6 04/06/2020 at Unknown time  . Prenatal Vit-Fe Fumarate-FA (MULTIVITAMIN-PRENATAL) 27-0.8 MG TABS tablet Take 1 tablet by mouth daily at 12 noon.    04/06/2020 at Unknown time  . Prenatal Vit-Fe Phos-FA-Omega (VITAFOL GUMMIES) 3.33-0.333-34.8 MG CHEW Chew 1 tablet by mouth daily. 90 tablet 12 04/06/2020 at Unknown time  . valACYclovir (VALTREX) 500 MG tablet Take 1 tablet (500 mg total) by mouth 2 (two) times daily. 60 tablet 6 04/06/2020 at Unknown time  .  albuterol (PROVENTIL HFA;VENTOLIN HFA) 108 (90 Base) MCG/ACT inhaler Inhale 2 puffs into the lungs every 6 (six) hours as needed for wheezing or shortness of breath. 1 Inhaler 0      Review of Systems   All systems reviewed and negative except as stated in HPI  Blood pressure 129/89, pulse (!) 103, temperature 98.2 F (36.8 C), temperature source Oral, resp. rate 15, last menstrual period 06/18/2019, not currently breastfeeding. General appearance: alert, cooperative and no distress Lungs: normal respiratory effort Heart: regular rate and rhythm Abdomen: soft, non-tender; gravid Pelvic: as noted below Extremities: Homans sign is negative, no sign of DVT Presentation: cephalic by exam Fetal monitoringBaseline: 160 bpm, Variability: Good {> 6 bpm), Accelerations: Reactive and Decelerations: Absent Uterine activityFrequency: Every 2-6 minutes Dilation: 2 Effacement (%): 20 Station: -1 Exam by:: Dr. Germaine Pomfret   Prenatal labs: ABO, Rh: --/--/PENDING (12/18 0840) Antibody: PENDING (12/18 0840) Rubella: 6.50 (05/19 1547) RPR: Non Reactive (10/06 0931)  HBsAg: Negative (05/19 1547)  HIV: Reactive (05/19 1547)  GBS: Negative/-- (12/01 1611)  2 hr Glucola passed Genetic screening normal Anatomy US normal  Prenatal Transfer Tool  Maternal Diabetes: No Genetic Screening: Normal Maternal Ultrasounds/Referrals: Normal Fetal Ultrasounds or other Referrals:  None Maternal Substance Abuse:  No Significant Maternal Medications:  Meds include: Other: tivicay, truvada, valtrex, albuterol Significant Maternal Lab Results: Group B Strep negative and HIV positive  Results for orders placed or performed during the hospital encounter of 04/06/20 (from the past 24 hour(s))  CBC   Collection Time: 04/06/20  8:40 AM  Result Value Ref Range   WBC 10.4 4.0 - 10.5 K/uL   RBC 4.38 3.87 - 5.11 MIL/uL   Hemoglobin 10.0 (L) 12.0 - 15.0 g/dL   HCT 06.2 (L) 37.6 - 28.3 %   MCV 76.9 (L) 80.0 - 100.0  fL   MCH 22.8 (L) 26.0 - 34.0 pg   MCHC 29.7 (L) 30.0 - 36.0 g/dL   RDW 15.1 (H) 76.1 - 60.7 %   Platelets 258 150 - 400 K/uL   nRBC 0.0 0.0 - 0.2 %  Type and screen   Collection Time: 04/06/20  8:40 AM  Result Value Ref Range   ABO/RH(D) PENDING    Antibody Screen PENDING    Sample Expiration      04/09/2020,2359 Performed at [facility], [address], [place], [place] [address]     Patient Active Problem List   Diagnosis Date Noted  . Indication for care in labor or delivery 04/06/2020  . Supervision of high risk pregnancy, antepartum 09/06/2019  . HIV infection in mother during pregnancy, antepartum 09/06/2019  . 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 Herpes 10/06/2013  . Asthma 11/17/2011  .  Human immunodeficiency virus (HIV) disease (HCC) 11/17/2011    Assessment/Plan:  GIZZELLE LACOMB is a 24 y.o. [id number] at [redacted]w[redacted]d here for eIOL in the setting of HIV.  #IOL: Discussed IOL process with patient, Cytotec buccal. Discussed risks/benefits of FB and patient's cervical exam, patient elects for FB placement at this time, placed without difficulty. Will likely start pitocin at next check. #HIV: undetectable viral load, on meds. AZT ordered. #HSV: on suppression, no outbreaks in the past 5-6 years. No lesions noted on external exam. #Asthma: albuterol PRN, has not used since summer. No history of hospitalizations or intubation. #Pain: PRN, desires epidural #FWB: Cat 1 #ID: GBS neg, HIV positive, AZT ordered #MOF: breast, cleared by peds #MOC: unsure, no longer desires BTL, counseled #Circ: no   Alric Seton, MD  04/06/2020, 9:29 AM

## 2020-04-06 NOTE — Progress Notes (Signed)
Labor Progress Note Erica Walton is a 24 y.o. [id number] at [redacted]w[redacted]d presented for eIOL, in the setting of HIV. S: Doing well, comfortable with epidural. RN called due to recurrent late and variable decelerations.  O:  BP 116/83    Pulse 82    Temp 98.7 F (37.1 C) (Oral)    Resp 16    Ht 4\' 10"  (1.473 m)    Wt 80.1 kg    LMP 06/18/2019 Comment: neg. preg test   SpO2 98%    BMI 36.89 kg/m  EFM: baseline 150bpm/mod variability/+accles/intermittent late and variable decels with contractions Toco: q1-3 min  CVE: Dilation: 6 Effacement (%): 60 Station: -1,0 Presentation: Vertex Exam by:: [email or web address] RN   A&P: 24 y.o. 30 [redacted]w[redacted]d eIOL. #IOL: S/p FB and cytox1. Patient has made good cervical change, likely etiology of decelerations. Will continue expectant management at this time and initiate pitocin if contractions space out. #Pain: epidural #FWB: cat 2, but overall reassuring #GBS negative #HIV: undetectable viral load, on meds. AZT. #HSV: on suppression, no outbreaks in the past 5-6 years. No lesions noted on external exam. #Asthma: albuterol PRN, has not used since summer. No history of hospitalizations or intubation.  02-14-1976, MD 12:13 PM

## 2020-04-06 NOTE — Anesthesia Procedure Notes (Signed)
Epidural Patient location during procedure: OB Start time: 04/06/2020 10:15 AM End time: 04/06/2020 10:23 AM  Staffing Anesthesiologist: Atilano Median, DO Performed: anesthesiologist   Preanesthetic Checklist Completed: patient identified, IV checked, site marked, risks and benefits discussed, surgical consent, monitors and equipment checked, pre-op evaluation and timeout performed  Epidural Patient position: sitting Prep: ChloraPrep Patient monitoring: heart rate, continuous pulse ox and blood pressure Approach: midline Location: L3-L4 Injection technique: LOR saline  Needle:  Needle type: Tuohy  Needle gauge: 17 G Needle length: 9 cm Needle insertion depth: 6 cm Catheter type: closed end flexible Catheter size: 20 Guage Catheter at skin depth: 11 cm Test dose: negative and 1.5% lidocaine  Assessment Events: blood not aspirated, injection not painful, no injection resistance and no paresthesia  Additional Notes Patient identified. Risks/Benefits/Options discussed with patient including but not limited to bleeding, infection, nerve damage, paralysis, failed block, incomplete pain control, headache, blood pressure changes, nausea, vomiting, reactions to medications, itching and postpartum back pain. Confirmed with bedside nurse the patient's most recent platelet count. Confirmed with patient that they are not currently taking any anticoagulation, have any bleeding history or any family history of bleeding disorders. Patient expressed understanding and wished to proceed. All questions were answered. Sterile technique was used throughout the entire procedure. Please see nursing notes for vital signs. Test dose was given through epidural catheter and negative prior to continuing to dose epidural or start infusion. Warning signs of high block given to the patient including shortness of breath, tingling/numbness in hands, complete motor block, or any concerning symptoms with  instructions to call for help. Patient was given instructions on fall risk and not to get out of bed. All questions and concerns addressed with instructions to call with any issues or inadequate analgesia.    Reason for block:procedure for pain

## 2020-04-07 MED ORDER — IBUPROFEN 600 MG PO TABS: 600.0000 mg | ORAL_TABLET | Freq: Four times a day (QID) | ORAL | 0 refills | Status: DC | PRN

## 2020-04-07 NOTE — Anesthesia Postprocedure Evaluation (Signed)
Anesthesia Post Note  Patient: Erica Walton  Procedure(s) Performed: AN AD HOC LABOR EPIDURAL     Patient location during evaluation: Mother Baby Anesthesia Type: Epidural Level of consciousness: awake and alert Pain management: pain level controlled Vital Signs Assessment: post-procedure vital signs reviewed and stable Respiratory status: spontaneous breathing, nonlabored ventilation and respiratory function stable Cardiovascular status: stable Postop Assessment: no headache, no backache and epidural receding Anesthetic complications: no   No complications documented.  Last Vitals:  Vitals:   04/07/20 0040 04/07/20 0500  BP: 124/78 116/76  Pulse: 82 78  Resp: 18 18  Temp: 36.8 C   SpO2:  99%    Last Pain:  Vitals:   04/07/20 0500  TempSrc: Oral  PainSc:    Pain Goal: Patients Stated Pain Goal: 0 (04/06/20 0925)                 [name]

## 2020-04-07 NOTE — Progress Notes (Signed)
CSW consulted due to MOB having a diagnosis of being HIV positive. CSW went to speak with MOB at bedside to address further needs.    CSW congratulated MOB on the birth of infant. CSW advised MOB of CSW's role and the reason for CSW coming to speak with her. MOB expressed that she was diagnosed with HIV in 2013. MOB expressed that she is on Truvada and Trivicay and Isentress at the start of her pregnancy. MOB expressed that she stopped taking the Isentress medication once her pregnancy became further along. MOB reported to CSW that her spouse is well aware of her diagnosis and expressed that she has always been very open with him. CSW praised MOB for this and asked MOB about further treatment for herself and infant. MOB expressed that she is being seen at [facility]. MOB reported that it is her desire to have infant seen at [facility] where MOB reported her other children go. MOB reported that she has a great support from her spouse and spouses family.   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 MOB expressed that she was on Zoloft in the past to help treat PPD however MOB reported that she didn't like the way that the medication made her feel so she stopped. MOB reported that her spouse is "my counselor. He is very helpful". CSW praised MOB for this and offered MOB therapy resources in the community. MOB declined at this time however. CSW was advised that MOB has no other mental health hx and denies SI, HI and DV to this CSW. MOB expressed that she has all needed items to care for infant and reported no other needs to CSW as MOB has carseat and basinet/crib for infant to sleep in once arrived home.   CSW took time to provide MOB with PPD and SIDS education. MOB was given PPD Checklist in order to keep track of her  feelings as they may relate to PPD. MOB thanked CSW for information and reported no other needs at this time.    CSW followed up with [facility] to get appointment for infant. CSW left message at this time and will follow up on 04/08/20 to get appointment for infant.   Alira Fretwell S. Bertram Haddix, MSW, LCSW [facility] [place] [phone]    

## 2020-04-08 ENCOUNTER — Ambulatory Visit: Payer: Self-pay

## 2020-04-08 DIAGNOSIS — Z9889 Other specified postprocedural states: Secondary | ICD-10-CM

## 2020-04-08 DIAGNOSIS — B20 Human immunodeficiency virus [HIV] disease: Secondary | ICD-10-CM | POA: Diagnosis not present

## 2020-04-08 DIAGNOSIS — O9873 Human immunodeficiency virus [HIV] disease complicating the puerperium: Secondary | ICD-10-CM

## 2020-04-08 NOTE — Lactation Note (Signed)
This note was copied from a baby's chart. Lactation Consultation Note  Patient Name: Girl Mitsy Owen [id number] Date: 04/08/2020 Reason for consult: Follow-up assessment  Infant is 87 hrs old. DBM was given in a bottle via extra-slow flow nipple b/c infant was feeding frequently and was not able to maintain contentment after feedings (and b/c of weight loss). Infant did well. Paced bottle-feeding was taught to Mom.  I observed Mom pump with the size 21 flanges and they are an appropriate fit for her at this time. Mom was able to pump 1-2 mL, which I put in a colostrum vial. Since Mom will need milk to give once she is at home (if her milk has not come to volume when discharged tomorrow), I encouraged Mom to give [facility] for now & save her own EBM for when she gets home.  Mom was shown how to use DEBP & how to disassemble, clean, & reassemble parts.  Plan: 1. Offer both breasts. If infant is still cueing, give some DBM (I informed Mom to call out for nurse so that nurse can scan milk). 2. Pump on the initiation phase.  Mom's questions were answered to her satisfaction. Mom was pleased with the help received.   Lurline Hare [facility] 04/08/2020, 2:54 PM

## 2020-04-08 NOTE — Lactation Note (Signed)
This note was copied from a baby's chart. Lactation Consultation Note  Patient Name: Erica Walton [id number]'K Date: 04/08/2020 Reason for consult: Initial assessment  Infant is 49 hrs old (LC order was recently put in). Mom is a P5 who only formula-fed her previous children (Mom remembers her milk coming to volume after their births). Mom reports + breast changes w/pregnancy (breast growth requiring a new bra size). Mom is on dolutegravir & emtricitabine/tenofovir.   Mom says she is "super sore" for the entire feeding on her L breast. Mom states that she is only sore on her R breast for a few minutes with feedings. As such, Mom has been offering the R breast more than the L breast. Mom states that her breasts feel fuller today. Colostrum was hand expressed with ease. Specifics of an asymmetric latch were shown via [employer].   Mom says her nipple is shaped like a "cone" when infant releases latch. Mom's nipples are intact. There is a bruise on her R areola where infant has missed a previous latch.   Mom says she hears swallows "like guzzling." I noted a  pacifier in the bassinet,but Mom says infant won't take it. I discouraged paci use for the time being.  Infant was beginning to cue & I was preparing to assist with latch, but CNM came into room. I did note an anterior frenulum. This LC to return.   Upon return to room, infant was latched. I did assist Mom with changing infant's alignment, but infant had already been on the breast for 15+ minutes & infant was getting sleepy/finishing feeding. I showed Mom how to Whiteside infant. Mom's L nipple was slightly sloped when infant released latch. Mom stated her L breast felt softer. Mom said she had "little to no pain" on that side.   Infant latched with ease to the R side; frequent swallows verified by cervical auscultation. Mom had "no pain" on the R side & her nipple looked normal when infant released the latch. Mom reported  that her R breast felt softer after the feeding.  Mom was made aware of O/P services, breastfeeding support groups, and our phone # for post-discharge questions. Mom has a Lansinoh pump at home.   Mom has made good great progress in achieving comfort with breastfeeding in the time I spent with her.  Dr. Ezequiel Essex was given an update.   Lurline Hare [facility] 04/08/2020, 9:09 AM

## 2020-04-08 NOTE — Lactation Note (Signed)
This note was copied from a baby's chart. Lactation Consultation Note  Patient Name: Erica Walton [id number] Date: 04/08/2020 Reason for consult: Follow-up assessment  The last couple of feedings, Mom has latched infant on her own and said that she has had no pain.  Mom has signed the [facility] permit, but would like to start pumping first. Mom may be a candidate for size 21 flanges; Mom will call me when she is ready for me to observe her pump for a few minutes.  Mom is aware that if her nipples were to become cracked or bleeding, she would need to temporarily discontinue breastfeeding from that side and pump and dump until cleared.  Erica Walton [facility] 04/08/2020, 1:14 PM

## 2020-04-08 NOTE — Progress Notes (Signed)
CSW followed back up with Vernona Rieger from [facility] to gather an appointment for infant, CSW left voicemail at this time requesting call back.    Erica Walton Erica Walton, MSW, LCSW [facility] [place] [phone]

## 2020-04-09 ENCOUNTER — Ambulatory Visit: Payer: Self-pay

## 2020-04-09 NOTE — Lactation Note (Signed)
This note was copied from a baby's chart. Lactation Consultation Note  Patient Name: Erica Walton [id number] Date: 04/09/2020 Reason for consult: Follow-up assessment   P5 mother whose infant is now 58 hours old.  This is a term baby at 39+0 weeks.  Mother did not breast feed any of her other children (now 18, 66, 55 and 26 year old).  Baby has a 10% weight loss today.  Mother is HIV+.  Previous LC worked extensively with mother yesterday.  Mother broke down during my visit and stated that she "is done" with breast feeding.  She feels like she cannot mentally do this and does not like to see the baby losing so much weight.  MD entered room during my visit and also spoke with mother.  MD reassured mother that we are here to support her decision to choose her feeding preference.  After MD left room mother informed me that she only wants to formula feed.  Acknowledged her wishes and supported her decision; informed her that she has to feel comfortable with her decision and what is right for her and the baby.  Mother happy to hear that I am okay with her decision.  She and I visited further and she showed me photos of her other children.  I expressed my happiness in seeing her happy with her decision.    Update given to MD and mother will no longer be using donor breast milk in the hospital.  She will continue to use Similac 20 in the hospital.  RN updated.  Discussed how to suppress milk production.  Mother familiar with this and had no further questions.     Maternal Data Formula Feeding for Exclusion: Yes Reason for exclusion: Mother's choice to formula and breast feed on admission  Feeding Feeding Type: Bottle Fed - Formula  LATCH Score                   Interventions    Lactation Tools Discussed/Used     Consult Status Consult Status: Complete Date: 04/09/20 Follow-up type: Call as needed    Erica Walton 04/09/2020, 10:43 AM

## 2020-04-11 ENCOUNTER — Encounter: Payer: Medicaid Other | Admitting: Obstetrics & Gynecology

## 2020-05-02 ENCOUNTER — Other Ambulatory Visit: Payer: Medicaid Other

## 2020-05-02 ENCOUNTER — Other Ambulatory Visit (HOSPITAL_COMMUNITY)
Admission: RE | Admit: 2020-05-02 | Discharge: 2020-05-02 | Disposition: A | Discharge status: Home / Self Care | Payer: Medicaid Other | Source: Ambulatory Visit | Attending: Infectious Diseases | Admitting: Infectious Diseases

## 2020-05-02 ENCOUNTER — Ambulatory Visit: Payer: Medicaid Other | Admitting: Infectious Diseases

## 2020-05-02 ENCOUNTER — Other Ambulatory Visit: Payer: Self-pay

## 2020-05-02 DIAGNOSIS — B20 Human immunodeficiency virus [HIV] disease: Secondary | ICD-10-CM

## 2020-05-02 DIAGNOSIS — Z113 Encounter for screening for infections with a predominantly sexual mode of transmission: Secondary | ICD-10-CM

## 2020-05-02 DIAGNOSIS — Z79899 Other long term (current) drug therapy: Secondary | ICD-10-CM

## 2020-05-03 LAB — T-HELPER CELL (CD4) - (RCID CLINIC ONLY)
CD4 % Helper T Cell: 42 % (ref 33–65)
CD4 T Cell Abs: 948 /uL (ref 400–1790)

## 2020-05-06 ENCOUNTER — Ambulatory Visit: Payer: Medicaid Other | Admitting: Family Medicine

## 2020-05-06 LAB — URINE CYTOLOGY ANCILLARY ONLY
Chlamydia: NEGATIVE
Comment: NEGATIVE
Comment: NORMAL
Neisseria Gonorrhea: NEGATIVE

## 2020-05-07 LAB — CBC WITH DIFFERENTIAL/PLATELET
Absolute Monocytes: 455 cells/uL (ref 200–950)
Basophils Absolute: 21 cells/uL (ref 0–200)
Basophils Relative: 0.3 %
Eosinophils Absolute: 400 cells/uL (ref 15–500)
Eosinophils Relative: 5.8 %
HCT: 36.7 % (ref 35.0–45.0)
Hemoglobin: 11.7 g/dL (ref 11.7–15.5)
Lymphs Abs: 2160 cells/uL (ref 850–3900)
MCH: 24.3 pg — ABNORMAL LOW (ref 27.0–33.0)
MCHC: 31.9 g/dL — ABNORMAL LOW (ref 32.0–36.0)
MCV: 76.1 fL — ABNORMAL LOW (ref 80.0–100.0)
MPV: 10.3 fL (ref 7.5–12.5)
Monocytes Relative: 6.6 %
Neutro Abs: 3864 cells/uL (ref 1500–7800)
Neutrophils Relative %: 56 %
Platelets: 367 10*3/uL (ref 140–400)
RBC: 4.82 10*6/uL (ref 3.80–5.10)
RDW: 17 % — ABNORMAL HIGH (ref 11.0–15.0)
Total Lymphocyte: 31.3 %
WBC: 6.9 10*3/uL (ref 3.8–10.8)

## 2020-05-07 LAB — LIPID PANEL
Cholesterol: 207 mg/dL — ABNORMAL HIGH (ref <200)
HDL: 37 mg/dL — ABNORMAL LOW (ref >=50)
LDL Cholesterol (Calc): 126 mg/dL (calc) — ABNORMAL HIGH
Non-HDL Cholesterol (Calc): 170 mg/dL (calc) — ABNORMAL HIGH (ref <130)
Total CHOL/HDL Ratio: 5.6 (calc) — ABNORMAL HIGH (ref <5.0)
Triglycerides: 307 mg/dL — ABNORMAL HIGH (ref <150)

## 2020-05-07 LAB — COMPLETE METABOLIC PANEL WITH GFR
AG Ratio: 1.7 (calc) (ref 1.0–2.5)
ALT: 28 U/L (ref 6–29)
AST: 24 U/L (ref 10–30)
Albumin: 4.2 g/dL (ref 3.6–5.1)
Alkaline phosphatase (APISO): 110 U/L (ref 31–125)
BUN: 11 mg/dL (ref 7–25)
CO2: 27 mmol/L (ref 20–32)
Calcium: 9.3 mg/dL (ref 8.6–10.2)
Chloride: 106 mmol/L (ref 98–110)
Creat: 0.91 mg/dL (ref 0.50–1.10)
GFR, Est African American: 101 mL/min/{1.73_m2} (ref >=60)
GFR, Est Non African American: 87 mL/min/{1.73_m2} (ref >=60)
Globulin: 2.5 g/dL (calc) (ref 1.9–3.7)
Glucose, Bld: 78 mg/dL (ref 65–99)
Potassium: 4.7 mmol/L (ref 3.5–5.3)
Sodium: 141 mmol/L (ref 135–146)
Total Bilirubin: 0.3 mg/dL (ref 0.2–1.2)
Total Protein: 6.7 g/dL (ref 6.1–8.1)

## 2020-05-07 LAB — HIV-1 RNA QUANT-NO REFLEX-BLD
HIV 1 RNA Quant: <20 Copies/mL
HIV-1 RNA Quant, Log: <1.3 Log cps/mL

## 2020-05-10 ENCOUNTER — Encounter: Payer: Self-pay | Admitting: Infectious Diseases

## 2020-05-10 ENCOUNTER — Other Ambulatory Visit: Payer: Self-pay

## 2020-05-10 ENCOUNTER — Telehealth (INDEPENDENT_AMBULATORY_CARE_PROVIDER_SITE_OTHER): Payer: Medicaid Other | Admitting: Infectious Diseases

## 2020-05-10 DIAGNOSIS — O99345 Other mental disorders complicating the puerperium: Secondary | ICD-10-CM | POA: Diagnosis not present

## 2020-05-10 DIAGNOSIS — B20 Human immunodeficiency virus [HIV] disease: Secondary | ICD-10-CM | POA: Diagnosis not present

## 2020-05-10 DIAGNOSIS — F53 Postpartum depression: Secondary | ICD-10-CM

## 2020-05-10 HISTORY — DX: Postpartum depression: F53.0

## 2020-05-10 MED ORDER — ESCITALOPRAM OXALATE 10 MG PO TABS: 10.0000 mg | ORAL_TABLET | Freq: Every day | ORAL | 2 refills | Status: DC

## 2020-05-10 NOTE — Patient Instructions (Addendum)
   Since you are not breastfeeding I am open to getting you started on Cabenuva    CABENUVA is the new medication to treat HIV. It is an injection in each butt cheek every 30 days at this time.   We would start with a pill "lead in" period for you where you take 2 pills once a day with food for 28 days to make sure you tolerate them well before we do the long acting injection.   Largely the side effects were of course related to the injection itself, but during the studies where this was used to treat HIV patients there were only a very small (< 5) number of patients that stopped it due to pain from the shots. Most of the patients in the trials reported overall improvement in the pain associated with the shots over time.   Patient Information Link: https://gskpro.com/content/dam/global/hcpportal/en_US/Prescribing_Information/Cabenuva/pdf/CABENUVA-PI-PIL-[email or web address]-IFU3.PDF#page=36

## 2020-05-10 NOTE — Assessment & Plan Note (Addendum)
Depression screen [facility] 2/9 05/10/2020 12/14/2019  Decreased Interest 1 0  Down, Depressed, Hopeless 1 0  PHQ - 2 Score 2 0  Altered sleeping 3 -  Tired, decreased energy 3 -  Change in appetite 1 -  Feeling bad or failure about yourself  0 -  Trouble concentrating 3 -  Moving slowly or fidgety/restless 0 -  Suicidal thoughts 3 -  PHQ-9 Score 15 -   We discussed options and will start Lexapro once daily. Counseled on side effects. Would suggest taking at night incase it makes her sleepy. Discussed if depression worsens will need to D/C and notify me. She also has an appt with her OB team in the next 2 weeks. I encouraged her to discuss further with them as well. Offered counseling sessions here, however the frequent visits is a barrier for her at the moment with her young children, but open to this in the future.   RTC in 4 weeks to follow up SSRI initiation.

## 2020-05-10 NOTE — Assessment & Plan Note (Signed)
Erica Walton is doing well on Tivicay and Truvada. Plan was to keep her on this during breast feeding relationship, however she decided to exclusively bottle feed as her milk supply was challenging. We discussed options and she would really like to consider Cabenuva injections. Daily pills are fine but challenging with 5 young kids so she feels anything that can be 'autopiloted' is helpful.  Counseled regarding two injections every month, need to keep appointments and avoid delaying injection to ensure adequate treatment.  Counseled regarding need to use durable birth control as Renaldo Harrison is not studied in pregnancy. She will start Depo injections at her upcoming OB visit in 1 month. She would like to achieve future pregnancy but not for 5 years or so.  Will submit for insurance approval, if trouble will plan to start her on Biktarvy once daily.   RTC in 4 weeks to evaluate depression and hopefully transition to injectable regimen.

## 2020-05-10 NOTE — Progress Notes (Signed)
Name: Erica Walton  DOB: Oct 09, 1995 MRN: [id number] PCP: Patient, No Pcp Per     VIRTUAL CARE ENCOUNTER  I connected with Erica Walton on 05/13/20 at 10:30 AM EST by VIDEO and verified that I am speaking with the correct person using two identifiers.   I discussed the limitations, risks, security and privacy concerns of performing an evaluation and management service by virtual service and the availability of in person appointments. I also discussed with the patient that there may be a patient responsible charge related to this service. The patient expressed understanding and agreed to proceed.  Patient Location: [place] residence    Other Participants:   Provider Location: Provider home     Brief Narrative:  Erica Walton is a 25 y.o. female with well controlled HIV, Dx 2013 with routine pregnancy testing. Has always been on treatment and previously in care through [facility].  CD4 nadir 300  HIV Risk: heterosexual  History of OIs: none known  Intake Labs 06/2019: Hep B sAg (-), sAb (+), cAb (-); Hep A (-), Hep C (-) Quantiferon (-) HLA B*5701 (-) G6PD: ()   Previous Regimens: . Truvada + Prezista/Ritonovir . Stribild . Genvoya --> undetectable  . Tivicay + Truvada during pregnancy 2021 - 2022   Genotypes: . 2013 - K103N     Subjective:   Chief Complaint  Patient presents with  . Telehealth Consent       HPI: She is not breastfeeding as she had once hoped. She had a very hard time trying to get the baby to latch and it caused a significant pain for her. She is now exclusively formula feed.   She has had post partum depression in the past with her first pregnancy. Blood draw for RNA for baby came back negative.   She is having trouble with post partum depression. It is described to be at least moderate to severe. She feels it is mostly depression but she has also had increased anxiety at times and worry about every little thing. She has had  fleeting thoughts of plan but nothing she would execute. This happened with her first child years back. She would like to talk about starting on medication to treat this. Zoloft she knows made her depression much worse in the past.     Review of Systems  Constitutional: Negative for chills and fever.  HENT: Negative for tinnitus.   Eyes: Negative for blurred vision and photophobia.  Respiratory: Negative for cough and sputum production.   Cardiovascular: Negative for chest pain.  Gastrointestinal: Negative for diarrhea, nausea and vomiting.  Genitourinary: Negative for dysuria.  Skin: Negative for rash.  Neurological: Negative for headaches.  Psychiatric/Behavioral: Positive for depression and suicidal ideas. Negative for substance abuse.    Past Medical History:  Diagnosis Date  . Asthma   . Genital herpes   . HIV (human immunodeficiency virus infection) ([place])     Outpatient Medications Prior to Visit  Medication Sig Dispense Refill  . albuterol (PROVENTIL HFA;VENTOLIN HFA) 108 (90 Base) MCG/ACT inhaler Inhale 2 puffs into the lungs every 6 (six) hours as needed for wheezing or shortness of breath. 1 Inhaler 0  . ibuprofen (ADVIL) 600 MG tablet Take 1 tablet (600 mg total) by mouth every 6 (six) hours as needed for moderate pain. 30 tablet 0  . valACYclovir (VALTREX) 500 MG tablet Take 1 tablet (500 mg total) by mouth 2 (two) times daily. 60 tablet 6  . calcium carbonate (TUMS -  DOSED IN MG ELEMENTAL CALCIUM) 500 MG chewable tablet Chew 1 tablet by mouth daily.     . dolutegravir (TIVICAY) 50 MG tablet Take 1 tablet (50 mg total) by mouth daily. 30 tablet 6  . emtricitabine-tenofovir (TRUVADA) 200-300 MG tablet Take 1 tablet by mouth daily. 30 tablet 6  . Prenatal Vit-Fe Fumarate-FA (MULTIVITAMIN-PRENATAL) 27-0.8 MG TABS tablet Take 1 tablet by mouth daily at 12 noon.     . Prenatal Vit-Fe Phos-FA-Omega (VITAFOL GUMMIES) 3.33-0.333-34.8 MG CHEW Chew 1 tablet by mouth daily. 90  tablet 12   No facility-administered medications prior to visit.     Allergies  Allergen Reactions  . Norco [Hydrocodone-Acetaminophen] Rash  . Penicillins Rash and Other (See Comments)    Has patient had a PCN reaction causing immediate rash, facial/tongue/throat swelling, SOB or lightheadedness with hypotension: yes, [id number]} Has patient had a PCN reaction causing severe rash involving mucus membranes or skin necrosis: no:[id number]} Has patient had a PCN reaction that required hospitalization no:[id number]} Has patient had a PCN reaction occurring within the last 10 years: no:[id number]} If all of the above answers are "NO", then may proceed with Cephalosporin use.     Social History   Tobacco Use  . Smoking status: Never Smoker  . Smokeless tobacco: Never Used  Substance Use Topics  . Alcohol use: Not Currently    Comment: 2018  . Drug use: Never    Social History   Substance and Sexual Activity  Sexual Activity Yes  . Birth control/protection: None     Objective:   There were no vitals filed for this visit. There is no height or weight on file to calculate BMI.  Physical Exam Constitutional:      Appearance: Normal appearance. She is not ill-appearing.  HENT:     Mouth/Throat:     Mouth: Mucous membranes are moist.     Pharynx: Oropharynx is clear.  Eyes:     General: No scleral icterus. Pulmonary:     Effort: Pulmonary effort is normal.  Neurological:     Mental Status: She is oriented to person, place, and time.  Psychiatric:        Mood and Affect: Mood normal.        Thought Content: Thought content normal.     Lab Results Lab Results  Component Value Date   WBC 6.9 05/02/2020   HGB 11.7 05/02/2020   HCT 36.7 05/02/2020   MCV 76.1 (L) 05/02/2020   PLT 367 05/02/2020    Lab Results  Component Value Date   CREATININE 0.91 05/02/2020   BUN 11 05/02/2020   NA 141 05/02/2020   K 4.7 05/02/2020   CL 106 05/02/2020   CO2 27 05/02/2020     Lab Results  Component Value Date   ALT 28 05/02/2020   AST 24 05/02/2020   ALKPHOS 132 (H) 04/06/2020   BILITOT 0.3 05/02/2020    Lab Results  Component Value Date   CHOL 207 (H) 05/02/2020   HDL 37 (L) 05/02/2020   LDLCALC 126 (H) 05/02/2020   TRIG 307 (H) 05/02/2020   CHOLHDL 5.6 (H) 05/02/2020   HIV 1 RNA Quant  Date Value  05/02/2020 <20 Copies/mL  12/01/2019 <20 Copies/mL (H)  07/17/2019 <20 NOT DETECTED copies/mL   HIV-1 RNA Viral Load (copies/mL)  Date Value  03/06/2020 <20   CD4 T Cell Abs (/uL)  Date Value  05/02/2020 948  12/01/2019 643  07/17/2019 801     Assessment & Plan:  Patient Active Problem List   Diagnosis Date Noted  . 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 HIV infection in mother during pregnancy, antepartum 09/06/2019  . 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 Herpes 10/06/2013  . Asthma 11/17/2011  . Human immunodeficiency virus (HIV) disease ([place]) 11/17/2011    Problem List Items Addressed This Visit      Unprioritized   Post partum depression    Depression screen [facility] 2/9 05/10/2020 12/14/2019  Decreased Interest 1 0  Down, Depressed, Hopeless 1 0  PHQ - 2 Score 2 0  Altered sleeping 3 -  Tired, decreased energy 3 -  Change in appetite 1 -  Feeling bad or failure about yourself  0 -  Trouble concentrating 3 -  Moving slowly or fidgety/restless 0 -  Suicidal thoughts 3 -  PHQ-9 Score 15 -   We discussed options and will start Lexapro once daily. Counseled on side effects. Would suggest taking at night incase it makes her sleepy. Discussed if depression worsens will need to D/C and notify me. She also has an appt with her OB team in the next 2 weeks. I encouraged her to discuss further with them as well. Offered counseling sessions here, however the frequent visits is a barrier for her at the moment with her young children, but open to this in the future.   RTC in 4 weeks to follow up SSRI initiation.       Relevant  Medications   escitalopram (LEXAPRO) 10 MG tablet   Human immunodeficiency virus (HIV) disease ([place])    Erica Walton is doing well on Tivicay and Truvada. Plan was to keep her on this during breast feeding relationship, however she decided to exclusively bottle feed as her milk supply was challenging. We discussed options and she would really like to consider Cabenuva injections. Daily pills are fine but challenging with 5 young kids so she feels anything that can be 'autopiloted' is helpful.  Counseled regarding two injections every month, need to keep appointments and avoid delaying injection to ensure adequate treatment.  Counseled regarding need to use durable birth control as Kern Reap is not studied in pregnancy. She will start Depo injections at her upcoming OB visit in 1 month. She would like to achieve future pregnancy but not for 5 years or so.  Will submit for insurance approval, if trouble will plan to start her on Biktarvy once daily.   RTC in 4 weeks to evaluate depression and hopefully transition to injectable regimen.          Janene Madeira, MSN, NP-C [facility] for [facility] [place] Pager: [phone] Office: [phone]  05/13/20  1:06 PM

## 2020-05-16 ENCOUNTER — Other Ambulatory Visit (HOSPITAL_COMMUNITY)
Admission: RE | Admit: 2020-05-16 | Discharge: 2020-05-16 | Disposition: A | Discharge status: Home / Self Care | Payer: Medicaid Other | Source: Ambulatory Visit | Attending: Family Medicine | Admitting: Family Medicine

## 2020-05-16 ENCOUNTER — Encounter: Payer: Self-pay | Admitting: Obstetrics and Gynecology

## 2020-05-16 ENCOUNTER — Other Ambulatory Visit: Payer: Self-pay

## 2020-05-16 ENCOUNTER — Ambulatory Visit (INDEPENDENT_AMBULATORY_CARE_PROVIDER_SITE_OTHER): Payer: Medicaid Other | Admitting: Obstetrics and Gynecology

## 2020-05-16 NOTE — Progress Notes (Signed)
    Post Partum Visit Note  Erica Walton is a 25 y.o. [id number] female who presents for a postpartum visit. She is 5 weeks postpartum following a normal spontaneous vaginal delivery.  I have fully reviewed the prenatal and intrapartum course. The delivery was at 39w gestational weeks and there were no lacerations.  Anesthesia: epidural. Postpartum course has been uncomplicated. Baby is doing well. Baby is feeding by bottle - [employer] Good Start. Bleeding no bleeding. Bowel function is normal. Bladder function is normal. Patient is sexually active. Contraception method is condoms. Postpartum depression screening: positive.   The pregnancy intention screening data noted above was reviewed. Potential methods of contraception were discussed. The patient elected to proceed with Hormonal Injection. She has only ever used nexplanon before and had some bleeding issues with it.    Edinburgh Postnatal Depression Scale - 05/16/20 1532      Edinburgh Postnatal Depression Scale:  In the Past 7 Days   I have been able to laugh and see the funny side of things. 2    I have looked forward with enjoyment to things. 2    I have blamed myself unnecessarily when things went wrong. 2    I have been anxious or worried for no good reason. 2    I have felt scared or panicky for no good reason. 1    Things have been getting on top of me. 2    I have been so unhappy that I have had difficulty sleeping. 0    I have felt sad or miserable. 3    I have been so unhappy that I have been crying. 1    The thought of harming myself has occurred to me. 3    Edinburgh Postnatal Depression Scale Total 18           Review of Systems Pertinent items noted in HPI and remainder of comprehensive ROS otherwise negative.    Objective:  BP 112/74   Pulse 71   Wt 153 lb 3.2 oz (69.5 kg)   BMI 32.02 kg/m    NAD EGBUS: normal Vagina: normal. Mild cystocele to -2 with valsalva Cervix: normal. No descent with  valsalva Uterus: small, nttp, mobile  Assessment:    normal  postpartum exam.   Plan:  *Postpartum: pap smear done today. BC options d/w her and I recommend IUD but she would like to try depo provera. I d/w her potential bone density issues with depo provera especially on HAART but she would like to proceed; she does not want a BTL. I told her I will need to double check to see if depo is okay with her current meds. We have to wait two weeks due to recent sexual intercourse.   *Depression: start on lexapro by PCP. Depression screening stable from a few days when meds were initiated. Resources given to patient in case mood worsens and pt confirms this.   RTC: 2wk UPT and birth control initiation  [place] Bing, MD Center for [employer], [facility]

## 2020-05-21 ENCOUNTER — Telehealth: Payer: Self-pay

## 2020-05-21 LAB — CYTOLOGY - PAP
Comment: NEGATIVE
Diagnosis: NEGATIVE
High risk HPV: NEGATIVE

## 2020-05-21 MED ORDER — RILPIVIRINE HCL 25 MG PO TABS: 25.0000 mg | ORAL_TABLET | Freq: Every day | ORAL | 0 refills | Status: DC

## 2020-05-21 MED ORDER — VOCABRIA 30 MG PO TABS: 1.0000 | ORAL_TABLET | Freq: Every day | ORAL | 0 refills | Status: DC

## 2020-05-21 NOTE — Telephone Encounter (Signed)
Hello Judeth Cornfield,  Yes if you can send the lead to [facility] and then just let me know I can call [facility] to arrange and find out the delivery date.

## 2020-05-21 NOTE — Telephone Encounter (Signed)
[facility] Patient Advocate Encounter  I spoke to [facility] and they will be shipping her medications (oral lead -Randa Evens & Shane Crutch) to her today 05/21/20 & she will be receiving it on 05/22/20.  [name] , CPhT Specialty Pharmacy Patient [facility] for Infectious Disease Phone: [phone] Fax:  [phone]

## 2020-05-22 ENCOUNTER — Other Ambulatory Visit: Payer: Self-pay | Admitting: Infectious Diseases

## 2020-05-22 MED ORDER — CABOTEGRAVIR & RILPIVIRINE ER 600 & 900 MG/3ML IM SUER
1.0000 | Freq: Once | INTRAMUSCULAR | 0 refills | Status: DC
Start: 2020-06-21 — End: 2020-06-21

## 2020-05-22 NOTE — Addendum Note (Signed)
Addended by: Blanchard Kelch on: 05/22/2020 01:25 PM   Modules accepted: Orders

## 2020-05-22 NOTE — Telephone Encounter (Signed)
I took care of it already - thank you though

## 2020-05-22 NOTE — Telephone Encounter (Signed)
Excellent - she has received the meds and counseling. She will start QHS with dinner today. Return visit scheduled for 3/4 to administer initial injection. Order has been sent in for kit to [facility].  Will schedule follow up appts and maintenance medication kits at return visit.

## 2020-05-22 NOTE — Telephone Encounter (Signed)
Ok thanks stephanie, I can set up her 1st injection to come in when she have her appt if you want?

## 2020-06-01 ENCOUNTER — Other Ambulatory Visit: Payer: Self-pay | Admitting: Infectious Diseases

## 2020-06-04 ENCOUNTER — Ambulatory Visit (INDEPENDENT_AMBULATORY_CARE_PROVIDER_SITE_OTHER): Payer: Medicaid Other | Admitting: Obstetrics & Gynecology

## 2020-06-04 ENCOUNTER — Other Ambulatory Visit: Payer: Self-pay

## 2020-06-04 VITALS — BP 110/78 | HR 85

## 2020-06-04 DIAGNOSIS — Z01812 Encounter for preprocedural laboratory examination: Secondary | ICD-10-CM | POA: Diagnosis not present

## 2020-06-04 DIAGNOSIS — Z3042 Encounter for surveillance of injectable contraceptive: Secondary | ICD-10-CM | POA: Diagnosis not present

## 2020-06-04 DIAGNOSIS — Z30013 Encounter for initial prescription of injectable contraceptive: Secondary | ICD-10-CM

## 2020-06-04 LAB — POCT URINE PREGNANCY: Preg Test, Ur: NEGATIVE

## 2020-06-04 MED ORDER — MEDROXYPROGESTERONE ACETATE 150 MG/ML IM SUSP
150.0000 mg | Freq: Once | INTRAMUSCULAR | Status: AC
  Administered 2020-06-04: 150 mg via INTRAMUSCULAR

## 2020-06-04 MED ORDER — MEDROXYPROGESTERONE ACETATE 150 MG/ML IM SUSP
150.0000 mg | INTRAMUSCULAR | 3 refills | Status: DC
Start: 2020-06-04 — End: 2021-02-18

## 2020-06-04 NOTE — Progress Notes (Signed)
Patient was assessed and managed by nursing staff during this encounter. I have reviewed the chart and agree with the documentation and plan. I have also made any necessary editorial changes.  Jaynie Collins, MD 06/04/2020 2:40 PM

## 2020-06-04 NOTE — Progress Notes (Signed)
Pt here to start depo. Pt last unprotected intercourse was 2 weeks ago. UPT in office was negative.  Date last pap: 05/16/2020 Last Depo-Provera: NA Side Effects if any: NA. Serum HCG indicated? NA. Depo-Provera 150 mg IM given by: Scheryl Marten, RN. Next appointment due May 3-17th

## 2020-06-20 ENCOUNTER — Telehealth: Payer: Self-pay

## 2020-06-20 NOTE — Telephone Encounter (Signed)
[facility] Patient Advocate Encounter  Patient's medication Erica Walton) have been couriered to [facility] from [facility] and will be administered on the patient next appointment on 06/21/20.  [name] , CPhT Specialty Pharmacy Patient [facility] for Infectious Disease Phone: [phone] Fax:  [phone]

## 2020-06-21 ENCOUNTER — Other Ambulatory Visit: Payer: Self-pay | Admitting: Family

## 2020-06-21 ENCOUNTER — Ambulatory Visit (INDEPENDENT_AMBULATORY_CARE_PROVIDER_SITE_OTHER): Payer: Medicaid Other | Admitting: Family

## 2020-06-21 ENCOUNTER — Other Ambulatory Visit: Payer: Self-pay

## 2020-06-21 ENCOUNTER — Encounter: Payer: Self-pay | Admitting: Family

## 2020-06-21 VITALS — BP 106/74 | HR 85 | Wt 152.0 lb

## 2020-06-21 DIAGNOSIS — B20 Human immunodeficiency virus [HIV] disease: Secondary | ICD-10-CM | POA: Diagnosis not present

## 2020-06-21 MED ORDER — CABOTEGRAVIR & RILPIVIRINE ER 600 & 900 MG/3ML IM SUER
1.0000 | Freq: Once | INTRAMUSCULAR | Status: AC
  Administered 2020-06-21: 1 via INTRAMUSCULAR

## 2020-06-21 MED ORDER — CABOTEGRAVIR & RILPIVIRINE ER 600 & 900 MG/3ML IM SUER: INTRAMUSCULAR | 5 refills | Status: DC

## 2020-06-21 NOTE — Assessment & Plan Note (Signed)
Ms. Erica Walton had good adherence and tolerance of her oral lead-in therapy with cabotegravir and rilpivirine.  Initial injection of Cabenuva provided following review of risks, benefits, and potential side effects.  We discussed further treatment options and she wishes to proceed with every 22-month injections.  We will schedule her for 1 month follow-up with Cabenuva 600/900 then every 2 months thereafter. Plan for follow up in 1 month or sooner if needed.

## 2020-06-21 NOTE — Patient Instructions (Signed)
Nice to see you.  Plan for follow up in 1 month or sooner if needed.   Have a great day and stay safe!

## 2020-06-21 NOTE — Progress Notes (Signed)
Subjective:    Patient ID: Erica Walton, female    DOB: 03-Jul-1995, 25 y.o.   MRN: [id number]  Chief Complaint  Patient presents with  . Follow-up    First dose of cabenuva injection today;      HPI:  Erica Walton is a 25 y.o. female with HIV disease who was last in contact on 05/21/20 and started on oral lead in therapy for [place]. Most recent viral load on 05/02/20 was undetectable and CD4 count 948. She had a negative pregnancy test on 06/04/20. Here today for first injection of Cabenuva.   [name] has been taking her oral Cabotegravir and Rilpivirine daily as prescribed with no adverse side effects or missed doses. Overall feeling well today and excited to start the injectable phase. Denies fevers, chills, night sweats, headaches, changes in vision, neck pain/stiffness, nausea, diarrhea, vomiting, lesions or rashes.    Allergies  Allergen Reactions  . Norco [Hydrocodone-Acetaminophen] Rash  . Penicillins Rash and Other (See Comments)    Has patient had a PCN reaction causing immediate rash, facial/tongue/throat swelling, SOB or lightheadedness with hypotension: yes, [id number]} Has patient had a PCN reaction causing severe rash involving mucus membranes or skin necrosis: no:[id number]} Has patient had a PCN reaction that required hospitalization no:[id number]} Has patient had a PCN reaction occurring within the last 10 years: no:[id number]} If all of the above answers are "NO", then may proceed with Cephalosporin use.       Outpatient Medications Prior to Visit  Medication Sig Dispense Refill  . Cabotegravir Sodium (VOCABRIA) 30 MG TABS Take 1 tablet by mouth daily. 30 tablet 0  . albuterol (PROVENTIL HFA;VENTOLIN HFA) 108 (90 Base) MCG/ACT inhaler Inhale 2 puffs into the lungs every 6 (six) hours as needed for wheezing or shortness of breath. 1 Inhaler 0  . cabotegravir & rilpivirine ER (CABENUVA) 600 & 900 MG/3ML injection Inject 1 kit into the muscle once  for 1 dose. 6 mL 0  . escitalopram (LEXAPRO) 10 MG tablet Take 1 tablet (10 mg total) by mouth daily. 30 tablet 2  . ibuprofen (ADVIL) 600 MG tablet Take 1 tablet (600 mg total) by mouth every 6 (six) hours as needed for moderate pain. 30 tablet 0  . medroxyPROGESTERone (DEPO-PROVERA) 150 MG/ML injection Inject 1 mL (150 mg total) into the muscle every 3 (three) months. 1 mL 3  . rilpivirine (EDURANT) 25 MG TABS tablet Take 1 tablet (25 mg total) by mouth daily with breakfast. Try to take at the same times each day with a full meal 30 tablet 0  . valACYclovir (VALTREX) 500 MG tablet Take 1 tablet (500 mg total) by mouth 2 (two) times daily. 60 tablet 6   No facility-administered medications prior to visit.     Past Medical History:  Diagnosis Date  . Asthma   . Genital herpes   . HIV (human immunodeficiency virus infection) ([place])      Past Surgical History:  Procedure Laterality Date  . LAPAROSCOPIC APPENDECTOMY N/A 07/22/2019   Procedure: APPENDECTOMY LAPAROSCOPIC;  Surgeon: Herbert Pun, MD;  Location: [facility] ORS;  Service: General;  Laterality: N/A;       Review of Systems  Constitutional: Negative for appetite change, chills, diaphoresis, fatigue, fever and unexpected weight change.  Eyes:       Negative for acute change in vision  Respiratory: Negative for chest tightness, shortness of breath and wheezing.   Cardiovascular: Negative for chest pain.  Gastrointestinal: Negative for diarrhea, nausea and vomiting.  Genitourinary: Negative for dysuria, pelvic pain and vaginal discharge.  Musculoskeletal: Negative for neck pain and neck stiffness.  Skin: Negative for rash.  Neurological: Negative for seizures, syncope, weakness and headaches.  Hematological: Negative for adenopathy. Does not bruise/bleed easily.  Psychiatric/Behavioral: Negative for hallucinations.      Objective:    BP 106/74   Pulse 85   Wt 152 lb (68.9 kg)   BMI 31.77 kg/m  Nursing note and  vital signs reviewed.  Physical Exam Constitutional:      General: She is not in acute distress.    Appearance: She is well-developed.  HENT:     Mouth/Throat:     Mouth: Oropharynx is clear and moist.  Eyes:     Conjunctiva/sclera: Conjunctivae normal.  Cardiovascular:     Rate and Rhythm: Normal rate and regular rhythm.     Pulses: Intact distal pulses.     Heart sounds: Normal heart sounds. No murmur heard. No friction rub. No gallop.   Pulmonary:     Effort: Pulmonary effort is normal. No respiratory distress.     Breath sounds: Normal breath sounds. No wheezing or rales.  Chest:     Chest wall: No tenderness.  Abdominal:     General: Bowel sounds are normal.     Palpations: Abdomen is soft.     Tenderness: There is no abdominal tenderness.  Musculoskeletal:     Cervical back: Neck supple.  Lymphadenopathy:     Cervical: No cervical adenopathy.  Skin:    General: Skin is warm and dry.     Findings: No rash.  Neurological:     Mental Status: She is alert and oriented to person, place, and time.  Psychiatric:        Mood and Affect: Mood and affect normal.        Behavior: Behavior normal.        Thought Content: Thought content normal.        Judgment: Judgment normal.      Depression screen [facility] 2/9 06/21/2020 05/10/2020 12/14/2019  Decreased Interest 0 1 0  Down, Depressed, Hopeless 0 1 0  PHQ - 2 Score 0 2 0  Altered sleeping - 3 -  Tired, decreased energy - 3 -  Change in appetite - 1 -  Feeling bad or failure about yourself  - 0 -  Trouble concentrating - 3 -  Moving slowly or fidgety/restless - 0 -  Suicidal thoughts - 3 -  PHQ-9 Score - 15 -       Assessment & Plan:    Patient Active Problem List   Diagnosis Date Noted  . 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 HIV infection in mother during pregnancy, antepartum 09/06/2019  . 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 Herpes 10/06/2013  . Asthma 11/17/2011  . Human immunodeficiency virus (HIV)  disease ([place]) 11/17/2011     Problem List Items Addressed This Visit      Other   Human immunodeficiency virus (HIV) disease ([place]) - Primary    [name] had good adherence and tolerance of her oral lead-in therapy with cabotegravir and rilpivirine.  Initial injection of Cabenuva provided following review of risks, benefits, and potential side effects.  We discussed further treatment options and she wishes to proceed with every 27-monthinjections.  We will schedule her for 1 month follow-up with Cabenuva 600/900 then every 2 months thereafter. Plan for follow up in 1 month or sooner if needed.  I am having Kamillah M. Dollison maintain her albuterol, valACYclovir, ibuprofen, escitalopram, Vocabria, rilpivirine, cabotegravir & rilpivirine ER, and medroxyPROGESTERone. We administered cabotegravir & rilpivirine ER.   Meds ordered this encounter  Medications  . cabotegravir & rilpivirine ER (CABENUVA) 600 & 900 MG/3ML injection 1 kit     Follow-up: Return in about 1 month (around 07/22/2020), or if symptoms worsen or fail to improve.   Terri Piedra, MSN, FNP-C Nurse Practitioner [facility] for Infectious Disease [place] number: [phone]

## 2020-07-01 DIAGNOSIS — F53 Postpartum depression: Secondary | ICD-10-CM

## 2020-07-01 DIAGNOSIS — O99345 Other mental disorders complicating the puerperium: Secondary | ICD-10-CM

## 2020-07-01 MED ORDER — ESCITALOPRAM OXALATE 20 MG PO TABS: 20.0000 mg | ORAL_TABLET | Freq: Every day | ORAL | 1 refills | Status: DC

## 2020-07-01 NOTE — Addendum Note (Signed)
Addended by: Blanchard Kelch on: 07/01/2020 01:42 PM   Modules accepted: Orders

## 2020-07-16 ENCOUNTER — Telehealth: Payer: Self-pay

## 2020-07-16 NOTE — Telephone Encounter (Signed)
[facility] Patient Advocate Encounter  Patient's medication Renaldo Harrison) have been couriered to [facility] from [facility] and will be administered on patient next appointment on 07/22/20.  [name] , CPhT Specialty Pharmacy Patient [facility] for Infectious Disease Phone: [phone] Fax:  [phone]

## 2020-07-18 ENCOUNTER — Ambulatory Visit: Payer: Medicaid Other

## 2020-07-22 ENCOUNTER — Other Ambulatory Visit: Payer: Self-pay

## 2020-07-22 ENCOUNTER — Encounter: Payer: Self-pay | Admitting: Infectious Diseases

## 2020-07-22 ENCOUNTER — Ambulatory Visit (INDEPENDENT_AMBULATORY_CARE_PROVIDER_SITE_OTHER): Payer: Medicaid Other | Admitting: Infectious Diseases

## 2020-07-22 VITALS — BP 112/73 | HR 84 | Temp 97.9°F | Wt 154.0 lb

## 2020-07-22 DIAGNOSIS — O99345 Other mental disorders complicating the puerperium: Secondary | ICD-10-CM

## 2020-07-22 DIAGNOSIS — F53 Postpartum depression: Secondary | ICD-10-CM | POA: Diagnosis not present

## 2020-07-22 DIAGNOSIS — B20 Human immunodeficiency virus [HIV] disease: Secondary | ICD-10-CM | POA: Diagnosis not present

## 2020-07-22 MED ORDER — CABOTEGRAVIR & RILPIVIRINE ER 600 & 900 MG/3ML IM SUER
1.0000 | Freq: Once | INTRAMUSCULAR | Status: AC
Start: 2020-07-22 — End: 2020-07-22
  Administered 2020-07-22: 1 via INTRAMUSCULAR

## 2020-07-22 NOTE — Patient Instructions (Addendum)
Please continue your Lexapro everyday - would continue this for at least 6 months.   We gave you your injection of CABENUVA for treatment today.    Your next appointment has been scheduled in 2 months on 09/23/2020.     Helpful Tips:  If you experience any knots under the skin please use a warm compress frequently throughout the day to help.    Some people experience some redness at the site of the injection. This can be normal and should go away soon.   Moving around today is a good idea, if you sit too much it hurts more.   Please call the office to speak with our pharmacy or triage team if you have any questions or concerns.

## 2020-07-22 NOTE — Progress Notes (Signed)
Subjective:    Patient ID: Erica Walton, female    DOB: April 09, 1996, 25 y.o.   MRN: [id number]     CC:  Cabenuva maintenance injection  Depression follow up - much improved mood with dose increase.    HPI:  Erica Walton is a 25 y.o. female with well controlled HIV disease here for maintenance injection of Cabenuva. Had a sore bottom for 3-4 days, worse on day 2. Used warm compresses and "tiger balm" around the injection site that seemed to help.  Last injection 3/4 where she received 400/600 mg dose. We are planning to increase back to 600/900 mg kit today for Q110mintervals.    She says that the increase in the lexapro dose was very very helpful. She has started to notice great improvement in her mood, energy and improved sleep. She started back at work at [employer] week.   [facility] 06/21/2020 05/10/2020 12/14/2019  Decreased Interest 0 1 0  Down, Depressed, Hopeless 0 1 0  PHQ - 2 Score 0 2 0  Altered sleeping - 3 -  Tired, decreased energy - 3 -  Change in appetite - 1 -  Feeling bad or failure about yourself  - 0 -  Trouble concentrating - 3 -  Moving slowly or fidgety/restless - 0 -  Suicidal thoughts - 3 -  PHQ-9 Score - 15 -      Allergies  Allergen Reactions  . Norco [Hydrocodone-Acetaminophen] Rash  . Penicillins Rash and Other (See Comments)    Has patient had a PCN reaction causing immediate rash, facial/tongue/throat swelling, SOB or lightheadedness with hypotension: yes, [id number]}Has patient had a PCN reaction causing severe rash involving mucus membranes or skin necrosis: no:[id number]} Has patient had a PCN reaction that required hospitalization no:[id number]} Has patient had a PCN reaction occurring within the last 10 years: no:[id number]} If all of the above answers are "NO", then may proceed with Cephalosporin use.       Outpatient Medications Prior to Visit  Medication Sig Dispense Refill  . albuterol (PROVENTIL  HFA;VENTOLIN HFA) 108 (90 Base) MCG/ACT inhaler Inhale 2 puffs into the lungs every 6 (six) hours as needed for wheezing or shortness of breath. 1 Inhaler 0  . cabotegravir & rilpivirine ER (CABENUVA) 600 & 900 MG/3ML injection INJECT 1 KIT MONTHLY FOR 1 MONTH AND THEN EVERY OTHER MONTH. 6 mL 5  . escitalopram (LEXAPRO) 20 MG tablet Take 1 tablet (20 mg total) by mouth daily. 30 tablet 1  . medroxyPROGESTERone (DEPO-PROVERA) 150 MG/ML injection Inject 1 mL (150 mg total) into the muscle every 3 (three) months. 1 mL 3  . valACYclovir (VALTREX) 500 MG tablet Take 1 tablet (500 mg total) by mouth 2 (two) times daily. 60 tablet 6   No facility-administered medications prior to visit.     Past Medical History:  Diagnosis Date  . Asthma   . Genital herpes   . HIV [place] immunodeficiency virus infection) [place]       Past Surgical History:  Procedure Laterality Date  . LAPAROSCOPIC APPENDECTOMY N/A 07/22/2019   Procedure: APPENDECTOMY LAPAROSCOPIC;  Surgeon: CHerbert Pun MD;  Location: [facility] ORS;  Service: General;  Laterality: N/A;      Review of Systems  Constitutional: Negative for chills and fever.  HENT: Negative for sore throat.        No dental problems  Respiratory: Negative for cough.   Cardiovascular: Negative for chest pain and leg swelling.  Gastrointestinal:  Negative for abdominal pain, diarrhea and vomiting.  Genitourinary: Negative for dysuria and flank pain.  Musculoskeletal: Negative for myalgias and neck pain.  Skin: Negative for rash.  Neurological: Negative for dizziness and headaches.  Psychiatric/Behavioral: The patient is not nervous/anxious.         Objective:    BP 112/73   Pulse 84   Temp 97.9 F (36.6 C) (Oral)   Wt 154 lb (69.9 kg)   BMI 32.19 kg/m  Nursing note and vital signs reviewed.  Physical Exam Neurological:     Mental Status: She is oriented to person, place, and time.  Psychiatric:        Mood and Affect: Mood normal.         Behavior: Behavior normal.        Thought Content: Thought content normal.        Judgment: Judgment normal.     Comments: Smiling today. Good eye contact throughout conversation.         Assessment & Plan:   Patient Active Problem List   Diagnosis Date Noted  . 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 Herpes 10/06/2013  . Asthma 11/17/2011  . Human immunodeficiency virus (HIV) disease ([place]) 11/17/2011    Problem List Items Addressed This Visit      Unprioritized   Post partum depression    Much improved on higher dose of lexapro - we discussed management and I recommend she continue this for at least 6 months before she consider titration off. She is in agreement. Mentioned that previous mental health provider felt she was bipolar more in adolescence - I don't see any of these qualities in [place], and she is responding well to SSRI alone. Will continue to follow and if concern for refractory symptoms or new symptoms of mania will refer to psychiatry for assistance in alternate diagnosis and treatment.       Human immunodeficiency virus (HIV) disease ([place]) - Primary    Doing well on injections of Cabenuva with only minor injection site reactions so far. All injections have been administered at appropriate treatment interval in accordance with dose.   We will transition to higher dose of 600-900 mg kit for q26minjection intervals.  Will continue with Q23miral load monitoring during first 6 months for therapeutic treatment monitoring - she will have VL done today.   Last VL result:  HIV 1 RNA Quant (Copies/mL)  Date Value  05/02/2020 <20   HIV-1 RNA Viral Load (copies/mL)  Date Value  03/06/2020 <20    Dose Interval: Q2m31mxt Appointment: 09/23/2020  Contraception Plan: Depo injections maintained by GYN team  Pregnancy Test: not-needed       Relevant Orders   HIV-1 RNA quant-no reflex-bld      SteJanene MadeiraSN,  NP-C [email or web address] Pager: [phone]: [phone]

## 2020-07-22 NOTE — Assessment & Plan Note (Signed)
Much improved on higher dose of lexapro - we discussed management and I recommend she continue this for at least 6 months before she consider titration off. She is in agreement. Mentioned that previous mental health provider felt she was bipolar more in adolescence - I don't see any of these qualities in [place], and she is responding well to SSRI alone. Will continue to follow and if concern for refractory symptoms or new symptoms of mania will refer to psychiatry for assistance in alternate diagnosis and treatment.

## 2020-07-22 NOTE — Assessment & Plan Note (Signed)
Doing well on injections of Cabenuva with only minor injection site reactions so far. All injections have been administered at appropriate treatment interval in accordance with dose.   We will transition to higher dose of 600-900 mg kit for q39minjection intervals.  Will continue with Q239miral load monitoring during first 6 months for therapeutic treatment monitoring - she will have VL done today.   Last VL result:  HIV 1 RNA Quant (Copies/mL)  Date Value  05/02/2020 <20   HIV-1 RNA Viral Load (copies/mL)  Date Value  03/06/2020 <20    Dose Interval: Q2m52mxt Appointment: 09/23/2020  Contraception Plan: Depo injections maintained by GYN team  Pregnancy Test: not-needed

## 2020-07-22 NOTE — Addendum Note (Signed)
Addended by: Tressa Busman T on: 07/22/2020 12:07 PM   Modules accepted: Orders

## 2020-07-24 LAB — HIV-1 RNA QUANT-NO REFLEX-BLD
HIV 1 RNA Quant: NOT DETECTED Copies/mL
HIV-1 RNA Quant, Log: NOT DETECTED Log cps/mL

## 2020-07-25 ENCOUNTER — Other Ambulatory Visit: Payer: Self-pay | Admitting: Infectious Diseases

## 2020-07-25 NOTE — Telephone Encounter (Signed)
Please advise on 90 day supply. 

## 2020-08-20 ENCOUNTER — Other Ambulatory Visit: Payer: Self-pay

## 2020-08-20 ENCOUNTER — Other Ambulatory Visit (HOSPITAL_COMMUNITY): Payer: Self-pay

## 2020-08-20 ENCOUNTER — Ambulatory Visit (INDEPENDENT_AMBULATORY_CARE_PROVIDER_SITE_OTHER): Payer: Medicaid Other

## 2020-08-20 VITALS — BP 131/81 | HR 111

## 2020-08-20 DIAGNOSIS — Z3042 Encounter for surveillance of injectable contraceptive: Secondary | ICD-10-CM

## 2020-08-20 MED ORDER — MEDROXYPROGESTERONE ACETATE 150 MG/ML IM SUSP
150.0000 mg | Freq: Once | INTRAMUSCULAR | Status: AC
  Administered 2020-08-20: 150 mg via INTRAMUSCULAR

## 2020-08-20 NOTE — Progress Notes (Signed)
Date last pap: 05/16/2020 Last Depo-Provera: 06/04/2020 Side Effects if any: None Serum HCG indicated? NA Depo-Provera 150 mg IM given by: Philemon Kingdom, CMA Next appointment due: July 19th - Aug 2nd

## 2020-08-20 NOTE — Progress Notes (Signed)
Patient was assessed and managed by nursing staff during this encounter. I have reviewed the chart and agree with the documentation and plan. I have also made any necessary editorial changes.  Catalina Antigua, MD 08/20/2020 4:39 PM

## 2020-09-03 ENCOUNTER — Other Ambulatory Visit (HOSPITAL_COMMUNITY): Payer: Self-pay

## 2020-09-13 ENCOUNTER — Other Ambulatory Visit (HOSPITAL_COMMUNITY): Payer: Self-pay

## 2020-09-13 MED FILL — Cabotegravir 600 MG/3ML & Rilpivirine 900 MG/3ML IM Susp ER: INTRAMUSCULAR | 34 days supply | Qty: 6 | Fill #0 | Status: AC

## 2020-09-17 ENCOUNTER — Other Ambulatory Visit (HOSPITAL_COMMUNITY): Payer: Self-pay

## 2020-09-17 ENCOUNTER — Telehealth: Payer: Self-pay

## 2020-09-17 NOTE — Telephone Encounter (Signed)
[facility] Patient Advocate Encounter  Patient's medication Erica Walton) have been couriered to [facility] from [employer] and will be administered on patient next office visit on 09/23/20.  [name] , CPhT Specialty Pharmacy Patient [name] for Infectious Disease Phone: [phone] Fax:  [phone]

## 2020-09-23 ENCOUNTER — Other Ambulatory Visit: Payer: Self-pay

## 2020-09-23 ENCOUNTER — Ambulatory Visit (INDEPENDENT_AMBULATORY_CARE_PROVIDER_SITE_OTHER): Payer: Medicaid Other | Admitting: Infectious Diseases

## 2020-09-23 ENCOUNTER — Encounter: Payer: Self-pay | Admitting: Infectious Diseases

## 2020-09-23 VITALS — BP 102/77 | HR 93 | Temp 97.7°F | Wt 157.0 lb

## 2020-09-23 DIAGNOSIS — M545 Low back pain, unspecified: Secondary | ICD-10-CM | POA: Insufficient documentation

## 2020-09-23 DIAGNOSIS — B20 Human immunodeficiency virus [HIV] disease: Secondary | ICD-10-CM

## 2020-09-23 DIAGNOSIS — O99345 Other mental disorders complicating the puerperium: Secondary | ICD-10-CM

## 2020-09-23 DIAGNOSIS — F53 Postpartum depression: Secondary | ICD-10-CM | POA: Diagnosis not present

## 2020-09-23 DIAGNOSIS — J4541 Moderate persistent asthma with (acute) exacerbation: Secondary | ICD-10-CM

## 2020-09-23 DIAGNOSIS — G8929 Other chronic pain: Secondary | ICD-10-CM | POA: Diagnosis not present

## 2020-09-23 MED ORDER — CABOTEGRAVIR & RILPIVIRINE ER 600 & 900 MG/3ML IM SUER
1.0000 | Freq: Once | INTRAMUSCULAR | Status: AC
  Administered 2020-09-23: 1 via INTRAMUSCULAR

## 2020-09-23 MED ORDER — FLUTICASONE PROPIONATE 50 MCG/ACT NA SUSP: 1.0000 | Freq: Every day | NASAL | 0 refills | Status: DC

## 2020-09-23 MED ORDER — ALBUTEROL SULFATE HFA 108 (90 BASE) MCG/ACT IN AERS: 2.0000 | INHALATION_SPRAY | Freq: Four times a day (QID) | RESPIRATORY_TRACT | 2 refills | Status: DC | PRN

## 2020-09-23 NOTE — Patient Instructions (Addendum)
Nice to see you!  We gave you your injection of CABENUVA for treatment today.   Please stop by the lab on your way out.   Your next appointment has been scheduled in 2 months on 11/25/2020    For your allergies -   Flonase nasal spray to help with the nasal congestion. This will take a few weeks to work   Antihistamines - take either Claritin, Allegra, Zyrtec or Xyzal once daily in the morning every day.     For primary care services, please call for a new patient appointment:   Internal Medicine Clinic - ground floor of [facility]. [phone]

## 2020-09-23 NOTE — Assessment & Plan Note (Addendum)
We will refill her inhaler today.  Also discussed an allergy plan that she might be able to tolerate better including once daily Claritin or Allegra, Flonase nasal spray and benadryl at night. Will refer to internal medicine in the event she needs assessment for step up in therapy.

## 2020-09-23 NOTE — Assessment & Plan Note (Signed)
Recurrent low back pain.  Will help with referral to PCP for work-up.  For now encouraged her to use Tylenol for pain, consider back brace at work and well supported shoes.  Potential that weight gain may have contributed but she is not up very much.

## 2020-09-23 NOTE — Progress Notes (Signed)
Name: Erica Walton  DOB: 30-Mar-1996 MRN: [id number] PCP: Patient, No Pcp Per (Inactive)     Brief Narrative:  JULISSA Walton is a 25 y.o. female with well controlled HIV, Dx 2013 with routine pregnancy testing. Has always been on treatment and previously in care through [facility].  CD4 nadir 300  HIV Risk: heterosexual  History of OIs: none known  Intake Labs 06/2019: Hep B sAg (-), sAb (+), cAb (-); Hep A (-), Hep C (-) Quantiferon (-) HLA B*5701 (-) G6PD: ()   Previous Regimens: . Truvada + Prezista/Ritonovir . Stribild . Genvoya --> undetectable  . Tivicay + Truvada during pregnancy 2021 - 2022   Genotypes: . 2013 - K103N     Subjective:   Chief Complaint  Patient presents with  . Follow-up    B20       HPI: Erica Walton is doing well. She switched her job from [employer] to [employer] for something closer to home. She is having some significant back pain that has flared up since returning to work. Has a history of lower back pain but not sure what the cause of it is. She is requesting referral for PCP.  Needs a refill on her asthma inhaler and some advice for allergy options that won't make her too sleepy. Taking benadryl currently but makes her too tired to take at work.   She is doing well with the cabenuva injections.  She has some minor injection site reactions for the first 3 to 5 days but this is improving.  She uses Tiger balm and hot water to help with the swelling and pain with good effect.  Last injection was April 4.   She reports that the Lexapro is working very well for her mood and has no trouble with her depression currently.  Would like to continue this.    Review of Systems  Constitutional: Negative for chills and fever.  HENT: Positive for congestion, sinus pain and sore throat. Negative for tinnitus.   Eyes: Negative for blurred vision and photophobia.  Respiratory: Negative for cough and sputum production.   Cardiovascular:  Negative for chest pain.  Gastrointestinal: Negative for diarrhea, nausea and vomiting.  Genitourinary: Negative for dysuria.  Musculoskeletal: Positive for back pain. Negative for falls and neck pain.  Skin: Negative for rash.  Neurological: Negative for headaches.     Past Medical History:  Diagnosis Date  . Asthma   . Genital herpes   . HIV (human immunodeficiency virus infection) ([place])     Outpatient Medications Prior to Visit  Medication Sig Dispense Refill  . cabotegravir & rilpivirine ER (CABENUVA) 600 & 900 MG/3ML injection INJECT 1 KIT MONTHLY FOR 1 MONTH AND THEN EVERY OTHER MONTH. 6 mL 5  . escitalopram (LEXAPRO) 20 MG tablet TAKE 1 TABLET BY MOUTH EVERY DAY 90 tablet 1  . medroxyPROGESTERone (DEPO-PROVERA) 150 MG/ML injection Inject 1 mL (150 mg total) into the muscle every 3 (three) months. 1 mL 3  . valACYclovir (VALTREX) 500 MG tablet Take 1 tablet (500 mg total) by mouth 2 (two) times daily. 60 tablet 6  . albuterol (PROVENTIL HFA;VENTOLIN HFA) 108 (90 Base) MCG/ACT inhaler Inhale 2 puffs into the lungs every 6 (six) hours as needed for wheezing or shortness of breath. 1 Inhaler 0   No facility-administered medications prior to visit.     Allergies  Allergen Reactions  . Norco [Hydrocodone-Acetaminophen] Rash  . Penicillins Rash and Other (See Comments)    Has patient  had a PCN reaction causing immediate rash, facial/tongue/throat swelling, SOB or lightheadedness with hypotension: yes, [id number]:[id number]} Has patient had a PCN reaction causing severe rash involving mucus membranes or skin necrosis: no:[id number]} Has patient had a PCN reaction that required hospitalization no:[id number]} Has patient had a PCN reaction occurring within the last 10 years: no:[id number]} If all of the above answers are "NO", then may proceed with Cephalosporin use.     Social History   Tobacco Use  . Smoking status: Never Smoker  . Smokeless tobacco: Never Used  Substance Use Topics  .  Alcohol use: Not Currently    Comment: 2018  . Drug use: Never    Social History   Substance and Sexual Activity  Sexual Activity Yes  . Birth control/protection: None     Objective:   Vitals:   09/23/20 1125  BP: 102/77  Pulse: 93  Temp: 97.7 F (36.5 C)  TempSrc: Oral  Weight: 157 lb (71.2 kg)   Body mass index is 32.81 kg/m.    Physical Exam Constitutional:      Appearance: Normal appearance. She is not ill-appearing.  HENT:     Nose: Congestion present.     Mouth/Throat:     Mouth: Mucous membranes are moist.     Pharynx: Oropharynx is clear.     Comments: Hoarse voice  Eyes:     General: No scleral icterus. Pulmonary:     Effort: Pulmonary effort is normal.  Neurological:     Mental Status: She is oriented to person, place, and time.  Psychiatric:        Mood and Affect: Mood normal.        Thought Content: Thought content normal.     Lab Results Lab Results  Component Value Date   WBC 6.9 05/02/2020   HGB 11.7 05/02/2020   HCT 36.7 05/02/2020   MCV 76.1 (L) 05/02/2020   PLT 367 05/02/2020    Lab Results  Component Value Date   CREATININE 0.91 05/02/2020   BUN 11 05/02/2020   NA 141 05/02/2020   K 4.7 05/02/2020   CL 106 05/02/2020   CO2 27 05/02/2020    Lab Results  Component Value Date   ALT 28 05/02/2020   AST 24 05/02/2020   ALKPHOS 132 (H) 04/06/2020   BILITOT 0.3 05/02/2020    Lab Results  Component Value Date   CHOL 207 (H) 05/02/2020   HDL 37 (L) 05/02/2020   LDLCALC 126 (H) 05/02/2020   TRIG 307 (H) 05/02/2020   CHOLHDL 5.6 (H) 05/02/2020   HIV 1 RNA Quant (Copies/mL)  Date Value  07/22/2020 Not Detected  05/02/2020 <20  12/01/2019 <20 (H)   HIV-1 RNA Viral Load (copies/mL)  Date Value  03/06/2020 <20   CD4 T Cell Abs (/uL)  Date Value  05/02/2020 948  12/01/2019 643  07/17/2019 801     Assessment & Plan:   Patient Active Problem List   Diagnosis Date Noted  . Low back pain 09/23/2020  . Post  partum depression 05/10/2020  . 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 Herpes 10/06/2013  . Asthma 11/17/2011  . Human immunodeficiency virus (HIV) disease ([place]) 11/17/2011    Problem List Items Addressed This Visit      Unprioritized   Post partum depression    Continues to be under good control on 20 mg of Lexapro daily.  She would like to continue this.  Would recommend she continue through at least October so she has 6 months  of stability on the medication before she considers to reassess and wean off potentially.  She feels like she might continue beyond this since it is helped so much.      Low back pain    Recurrent low back pain.  Will help with referral to PCP for work-up.  For now encouraged her to use Tylenol for pain, consider back brace at work and well supported shoes.  Potential that weight gain may have contributed but she is not up very much.      Human immunodeficiency virus (HIV) disease ([place]) - Primary    Navie seems to be doing well with her Cabenuva injections.  We will update her blood work today including viral load, CD4 and liver function test.  Tolerating well with only minor injection site reactions.  All injections have been administered at appropriate treatment interval.    Last VL result:  HIV 1 RNA Quant (Copies/mL)  Date Value  07/22/2020 Not Detected   HIV-1 RNA Viral Load (copies/mL)  Date Value  03/06/2020 <20    Dose Interval: q71mNext Appointment: 11/25/2020  Contraception Plan: depo injections  Pregnancy Test: not needed - on time administrations        Relevant Medications   cabotegravir & rilpivirine ER (CABENUVA) 600 & 900 MG/3ML injection 1 kit   Other Relevant Orders   HIV-1 RNA quant-no reflex-bld   COMPLETE METABOLIC PANEL WITH GFR   T-helper cell (CD4)- ([facility] clinic only)   Asthma    We will refill her inhaler today.  Also discussed an allergy plan that she might be able to tolerate better including once daily Claritin  or Allegra, Flonase nasal spray and benadryl at night. Will refer to internal medicine in the event she needs assessment for step up in therapy.       Relevant Medications   albuterol (VENTOLIN HFA) 108 (90 Base) MCG/ACT inhaler      SJanene Madeira MSN, NP-C [facility] Hospital[facility] Infectious DKiefPager: [phone]: [phone] 09/23/20  12:03 PM

## 2020-09-23 NOTE — Assessment & Plan Note (Signed)
Erica Walton seems to be doing well with her Cabenuva injections.  We will update her blood work today including viral load, CD4 and liver function test.  Tolerating well with only minor injection site reactions.  All injections have been administered at appropriate treatment interval.    Last VL result:  HIV 1 RNA Quant (Copies/mL)  Date Value  07/22/2020 Not Detected   HIV-1 RNA Viral Load (copies/mL)  Date Value  03/06/2020 <20    Dose Interval: q79m Next Appointment: 11/25/2020  Contraception Plan: depo injections  Pregnancy Test: not needed - on time administrations

## 2020-09-23 NOTE — Assessment & Plan Note (Signed)
Continues to be under good control on 20 mg of Lexapro daily.  She would like to continue this.  Would recommend she continue through at least October so she has 6 months of stability on the medication before she considers to reassess and wean off potentially.  She feels like she might continue beyond this since it is helped so much.

## 2020-09-24 LAB — T-HELPER CELL (CD4) - (RCID CLINIC ONLY)
CD4 % Helper T Cell: 39 % (ref 33–65)
CD4 T Cell Abs: 857 /uL (ref 400–1790)

## 2020-09-25 LAB — HIV-1 RNA QUANT-NO REFLEX-BLD
HIV 1 RNA Quant: <20 Copies/mL — ABNORMAL HIGH
HIV-1 RNA Quant, Log: <1.3 Log cps/mL — ABNORMAL HIGH

## 2020-09-25 LAB — COMPLETE METABOLIC PANEL WITH GFR
AG Ratio: 1.6 (calc) (ref 1.0–2.5)
ALT: 14 U/L (ref 6–29)
AST: 15 U/L (ref 10–30)
Albumin: 4.5 g/dL (ref 3.6–5.1)
Alkaline phosphatase (APISO): 86 U/L (ref 31–125)
BUN: 10 mg/dL (ref 7–25)
CO2: 22 mmol/L (ref 20–32)
Calcium: 9.5 mg/dL (ref 8.6–10.2)
Chloride: 109 mmol/L (ref 98–110)
Creat: 0.71 mg/dL (ref 0.50–1.10)
GFR, Est African American: 136 mL/min/{1.73_m2} (ref >=60)
GFR, Est Non African American: 118 mL/min/{1.73_m2} (ref >=60)
Globulin: 2.8 g/dL (calc) (ref 1.9–3.7)
Glucose, Bld: 84 mg/dL (ref 65–99)
Potassium: 4.8 mmol/L (ref 3.5–5.3)
Sodium: 141 mmol/L (ref 135–146)
Total Bilirubin: 0.4 mg/dL (ref 0.2–1.2)
Total Protein: 7.3 g/dL (ref 6.1–8.1)

## 2020-09-30 ENCOUNTER — Other Ambulatory Visit: Payer: Self-pay | Admitting: Infectious Diseases

## 2020-09-30 DIAGNOSIS — B20 Human immunodeficiency virus [HIV] disease: Secondary | ICD-10-CM

## 2020-10-06 ENCOUNTER — Other Ambulatory Visit: Payer: Self-pay

## 2020-10-06 ENCOUNTER — Emergency Department (HOSPITAL_COMMUNITY)
Admission: EM | Admit: 2020-10-06 | Discharge: 2020-10-06 | Disposition: A | Discharge status: Home / Self Care | Payer: Medicaid Other | Attending: Emergency Medicine | Admitting: Emergency Medicine

## 2020-10-06 ENCOUNTER — Encounter (HOSPITAL_COMMUNITY): Payer: Self-pay | Admitting: Emergency Medicine

## 2020-10-06 ENCOUNTER — Emergency Department (HOSPITAL_COMMUNITY): Payer: Medicaid Other

## 2020-10-06 DIAGNOSIS — J45909 Unspecified asthma, uncomplicated: Secondary | ICD-10-CM | POA: Insufficient documentation

## 2020-10-06 DIAGNOSIS — M5441 Lumbago with sciatica, right side: Secondary | ICD-10-CM | POA: Diagnosis not present

## 2020-10-06 DIAGNOSIS — M25561 Pain in right knee: Secondary | ICD-10-CM | POA: Insufficient documentation

## 2020-10-06 DIAGNOSIS — Z21 Asymptomatic human immunodeficiency virus [HIV] infection status: Secondary | ICD-10-CM | POA: Diagnosis not present

## 2020-10-06 DIAGNOSIS — M545 Low back pain, unspecified: Secondary | ICD-10-CM | POA: Diagnosis not present

## 2020-10-06 LAB — URINALYSIS, ROUTINE W REFLEX MICROSCOPIC
Bilirubin Urine: NEGATIVE
Glucose, UA: NEGATIVE mg/dL
Hgb urine dipstick: NEGATIVE
Ketones, ur: NEGATIVE mg/dL
Leukocytes,Ua: NEGATIVE
Nitrite: NEGATIVE
Protein, ur: NEGATIVE mg/dL
Specific Gravity, Urine: 1.02 (ref 1.005–1.030)
pH: 6 (ref 5.0–8.0)

## 2020-10-06 LAB — PREGNANCY, URINE: Preg Test, Ur: NEGATIVE

## 2020-10-06 MED ORDER — LIDOCAINE 5 % EX PTCH
1.0000 | MEDICATED_PATCH | Freq: Once | CUTANEOUS | Status: DC
  Administered 2020-10-06: 1 via TRANSDERMAL
  Filled 2020-10-06: qty 1

## 2020-10-06 MED ORDER — KETOROLAC TROMETHAMINE 30 MG/ML IJ SOLN
30.0000 mg | Freq: Once | INTRAMUSCULAR | Status: AC
  Administered 2020-10-06: 30 mg via INTRAMUSCULAR
  Filled 2020-10-06: qty 1

## 2020-10-06 MED ORDER — OXYCODONE-ACETAMINOPHEN 5-325 MG PO TABS
1.0000 | ORAL_TABLET | Freq: Once | ORAL | Status: AC
  Administered 2020-10-06: 1 via ORAL
  Filled 2020-10-06: qty 1

## 2020-10-06 MED ORDER — METHOCARBAMOL 500 MG PO TABS: 500.0000 mg | ORAL_TABLET | Freq: Two times a day (BID) | ORAL | 0 refills | Status: DC

## 2020-10-06 MED ORDER — METHOCARBAMOL 500 MG PO TABS
500.0000 mg | ORAL_TABLET | Freq: Once | ORAL | Status: AC
  Administered 2020-10-06: 500 mg via ORAL
  Filled 2020-10-06: qty 1

## 2020-10-06 MED ORDER — NAPROXEN 500 MG PO TABS: 500.0000 mg | ORAL_TABLET | Freq: Two times a day (BID) | ORAL | 0 refills | Status: DC

## 2020-10-06 MED ORDER — OXYCODONE-ACETAMINOPHEN 5-325 MG PO TABS: 1.0000 | ORAL_TABLET | Freq: Three times a day (TID) | ORAL | 0 refills | Status: DC | PRN

## 2020-10-06 NOTE — ED Notes (Signed)
Patient verbalized understanding of discharge instructions. Opportunity for questions and answers.  

## 2020-10-06 NOTE — ED Provider Notes (Signed)
Emergency Medicine Provider Triage Evaluation Note  Erica Walton , a 25 y.o. female  was evaluated in triage.  Pt complains of gradual onset, constant, sharp, right lower back pain radiating down RLE that started last night and woke her up out of her sleep. Hx of sciatica 2 years ago. NO recent falls or trauma. Has been taking Tylenol for pain. LNMP in April; pt receives depo injections; last in May. Denies risk of pregnancy. No urinary retention, urinary or bowel incontinence, saddle anesthesia, fevers, chills, urinary symptoms.  Review of Systems  Positive: + back pain, RLE pain Negative: - fevers, chills, urinary symptoms  Physical Exam  BP 118/78 (BP Location: Left Arm)   Pulse (!) 102   Temp 98.2 F (36.8 C) (Oral)   Resp 12   Ht 4\' 10"  (1.473 m)   Wt 72 kg   SpO2 100%   BMI 33.17 kg/m  Gen:   Awake, no distress   Resp:  Normal effort  MSK:   Moves extremities without difficulty  Other:  + midline lumbar TTP and associated right paralumbar musculature TTP. ROM to RLE limited s/2 pain. Sensation slightly diminished in RLE. 2+ PT Pulse  Medical Decision Making  Medically screening exam initiated at 3:53 PM.  Appropriate orders placed.  [name] was informed that the remainder of the evaluation will be completed by another provider, this initial triage assessment does not replace that evaluation, and the importance of remaining in the ED until their evaluation is complete.     [name], PA-C 10/06/20 1556    10/08/20, MD 10/06/20 1655

## 2020-10-06 NOTE — ED Notes (Signed)
Patient transported to X-ray 

## 2020-10-06 NOTE — ED Provider Notes (Signed)
[facility] EMERGENCY DEPARTMENT Provider Note   CSN: [id number] Arrival date & time: 10/06/20  1458     History Chief Complaint  Patient presents with   Back Pain    Erica Walton is a 25 y.o. female.  Erica Walton is a 25 y.o. female with a history of asthma and HIV, who presents to the emergency department for evaluation of back pain and knee pain.  Patient reports that over the past 3 days she has had a gradual onset of pain starting in her right low back and extending into the leg to the knee.  She denies injury or trauma associated with back pain, does report that she is on her feet walking a lot at work and does have to do some lifting and wonders if she could have hurt her back in this way.  She reports that she has had prior issues with sciatica but this pain seems more severe.  She took Tylenol without improvement.  While pain radiates into the legs she denies numbness or weakness.  No loss of bowel or bladder control or saddle anesthesia.  No associated abdominal pain and no urinary symptoms.  No fevers or chills.  Patient does have HIV but this has been well controlled with undetectable viral load for the past 10 years, patient is on monthly Cabenuva injections.  The history is provided by the patient.      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   Obesity affecting pregnancy 09/06/2019   Herpes 10/06/2013   Asthma 11/17/2011   Human immunodeficiency virus (HIV) disease ([place]) 11/17/2011    Past Surgical History:  Procedure Laterality Date   APPENDECTOMY     LAPAROSCOPIC APPENDECTOMY N/A 07/22/2019   Procedure: APPENDECTOMY LAPAROSCOPIC;  Surgeon: Herbert Pun, MD;  Location: [facility] ORS;  Service: General;  Laterality: N/A;      OB History     Gravida  5   Para  5   Term  5   Preterm      AB      Living  5      SAB      IAB      Ectopic      Multiple  0   Live Births  5           Family History  Problem Relation Age of Onset   Cancer Father    Breast cancer Other     Social History   Tobacco Use   Smoking status: Never   Smokeless tobacco: Never  Substance Use Topics   Alcohol use: Not Currently    Comment: 2018   Drug use: Never    Home Medications Prior to Admission medications   Medication Sig Start Date End Date Taking? Authorizing Provider  methocarbamol (ROBAXIN) 500 MG tablet Take 1 tablet (500 mg total) by mouth 2 (two) times daily. 10/06/20  Yes Jacqlyn Larsen, PA-C  naproxen (NAPROSYN) 500 MG tablet Take 1 tablet (500 mg total) by mouth 2 (two) times daily. 10/06/20  Yes Jacqlyn Larsen, PA-C  oxyCODONE-acetaminophen (PERCOCET) 5-325 MG tablet Take 1 tablet by mouth every 8 (eight) hours as needed. 10/06/20  Yes Jacqlyn Larsen, PA-C  albuterol (VENTOLIN HFA) 108 (90  Base) MCG/ACT inhaler Inhale 2 puffs into the lungs every 6 (six) hours as needed for wheezing or shortness of breath. 09/23/20   [place] Callas, NP  cabotegravir & rilpivirine ER (CABENUVA) 600 & 900 MG/3ML injection INJECT 1 KIT MONTHLY FOR 1 MONTH AND THEN EVERY OTHER MONTH. 06/21/20 06/21/21  [place], FNP  escitalopram (LEXAPRO) 20 MG tablet TAKE 1 TABLET BY MOUTH EVERY DAY 07/25/20   [place] Callas, NP  fluticasone (FLONASE) 50 MCG/ACT nasal spray Place 1 spray into both nostrils daily. 09/23/20   [place] Callas, NP  medroxyPROGESTERone (DEPO-PROVERA) 150 MG/ML injection Inject 1 mL (150 mg total) into the muscle every 3 (three) months. 06/04/20   Anyanwu, Sallyanne Havers, MD  valACYclovir (VALTREX) 500 MG tablet Take 1 tablet (500 mg total) by mouth 2 (two) times daily. 02/21/20   Caren Macadam, MD    Allergies    Norco [hydrocodone-acetaminophen] and Penicillins  Review of Systems    Review of Systems  Constitutional:  Negative for chills and fever.  HENT: Negative.    Respiratory:  Negative for shortness of breath.   Cardiovascular:  Negative for chest pain.  Gastrointestinal:  Negative for abdominal pain, constipation, diarrhea, nausea and vomiting.  Genitourinary:  Negative for dysuria, flank pain, frequency and hematuria.  Musculoskeletal:  Positive for arthralgias and back pain. Negative for gait problem, joint swelling, myalgias and neck pain.  Skin:  Negative for color change, rash and wound.  Neurological:  Negative for weakness and numbness.   Physical Exam Updated Vital Signs BP 104/68 (BP Location: Right Arm)   Pulse 84   Temp 98.8 F (37.1 C) (Oral)   Resp 15   Ht '4\' 10"'  (1.473 m)   Wt 72 kg   SpO2 99%   BMI 33.17 kg/m   Physical Exam Vitals and nursing note reviewed.  Constitutional:      General: She is not in acute distress.    Appearance: Normal appearance. She is well-developed. She is not ill-appearing or diaphoretic.  HENT:     Head: Atraumatic.  Eyes:     General:        Right eye: No discharge.        Left eye: No discharge.  Cardiovascular:     Pulses:          Radial pulses are 2+ on the right side and 2+ on the left side.       Dorsalis pedis pulses are 2+ on the right side and 2+ on the left side.       Posterior tibial pulses are 2+ on the right side and 2+ on the left side.  Pulmonary:     Effort: Pulmonary effort is normal. No respiratory distress.  Abdominal:     General: Bowel sounds are normal. There is no distension.     Palpations: Abdomen is soft. There is no mass.     Tenderness: There is no abdominal tenderness. There is no guarding.     Comments: Abdomen soft, nondistended, nontender to palpation in all quadrants without guarding or peritoneal signs, no CVA tenderness bilaterally  Musculoskeletal:     Cervical back: Neck supple.     Comments: Tenderness to palpation over midline lumbar spine and right  paraspinal muscles, no overlying skin changes or palpable step-off or deformity.  Pain made worse with range of motion of the lower extremities, positive straight leg raise on the right.  There is also some pain with palpation through the  right thigh and into the knee, no overlying skin changes, swelling or deformity.  Patient is able to range the knee with some discomfort.  No tenderness or pain over the calf, distal pulses 2+  Skin:    General: Skin is warm and dry.     Capillary Refill: Capillary refill takes less than 2 seconds.  Neurological:     Mental Status: She is alert and oriented to person, place, and time.     Comments: Alert, clear speech, following commands. Moving all extremities without difficulty. Bilateral lower extremities with 5/5 strength in proximal and distal muscle groups and with dorsi and plantar flexion. Sensation intact in bilateral lower extremities. 2+ patellar DTRs bilaterally. Ambulatory with steady gait  Psychiatric:        Behavior: Behavior normal.    ED Results / Procedures / Treatments   Labs (all labs ordered are listed, but only abnormal results are displayed) Labs Reviewed  URINALYSIS, [place], URINE    EKG None  Radiology DG Lumbar Spine Complete  Result Date: 10/06/2020 CLINICAL DATA:  Back pain. EXAM: LUMBAR SPINE - COMPLETE 4+ VIEW COMPARISON:  05/29/2015 FINDINGS: There is no evidence of lumbar spine fracture. Alignment is normal. Intervertebral disc spaces are maintained. Surgical clips in the right abdomen. IMPRESSION: Negative. Electronically Signed   By: Franchot Gallo M.D.   On: 10/06/2020 20:18   DG Knee Complete 4 Views Right  Result Date: 10/06/2020 CLINICAL DATA:  Knee pain and back pain EXAM: RIGHT KNEE - COMPLETE 4+ VIEW COMPARISON:  None. FINDINGS: No evidence of fracture, dislocation, or joint effusion. No evidence of arthropathy or other focal bone abnormality. Soft tissues are unremarkable.  IMPRESSION: Negative. Electronically Signed   By: Franchot Gallo M.D.   On: 10/06/2020 20:12    Procedures Procedures   Medications Ordered in ED Medications  ketorolac (TORADOL) 30 MG/ML injection 30 mg (30 mg Intramuscular Given 10/06/20 1715)  oxyCODONE-acetaminophen (PERCOCET/ROXICET) 5-325 MG per tablet 1 tablet (1 tablet Oral Given 10/06/20 1716)  methocarbamol (ROBAXIN) tablet 500 mg (500 mg Oral Given 10/06/20 1716)  oxyCODONE-acetaminophen (PERCOCET/ROXICET) 5-325 MG per tablet 1 tablet (1 tablet Oral Given 10/06/20 2029)    ED Course  I have reviewed the triage vital signs and the nursing notes.  Pertinent labs & imaging results that were available during my care of the patient were reviewed by me and considered in my medical decision making (see chart for details).    MDM Rules/Calculators/A&P                         Normal neurological exam, no evidence of urinary incontinence or retention, pain is consistently reproducible. There is no evidence of AAA or concern for dissection at this time.   Patient can walk but states is painful.  No loss of bowel or bladder control.  No concern for cauda equina.  No fever, night sweats, weight loss, h/o cancer, IVDU.  Patient does have HIV but this is extremely well controlled.  X-rays of the low back and right knee pain were unremarkable. Treated here in the department with adequate improvement. RICE protocol and pain medicine indicated and discussed with patient. I have also discussed reasons to return immediately to the ER.  Patient expresses understanding and agrees with plan.    Final Clinical Impression(s) / ED Diagnoses Final diagnoses:  Acute right-sided low back pain with right-sided sciatica  Acute pain of right knee  Rx / DC Orders ED Discharge Orders          Ordered    naproxen (NAPROSYN) 500 MG tablet  2 times daily        10/06/20 2135    methocarbamol (ROBAXIN) 500 MG tablet  2 times daily        10/06/20 2135     oxyCODONE-acetaminophen (PERCOCET) 5-325 MG tablet  Every 8 hours PRN        10/06/20 2135             Jacqlyn Larsen, PA-C 10/07/20 3762    Sherwood Gambler, MD 10/10/20 [id number]

## 2020-10-06 NOTE — Discharge Instructions (Addendum)
You were seen here today for Back Pain: Low back pain is discomfort in the lower back that may be due to injuries to muscles and ligaments around the spine. Occasionally, it may be caused by a problem to a part of the spine called a disc. Your back pain should be treated with medicines listed below as well as back exercises and this back pain should get better over the next 2 weeks. Most patients get completely well in 4 weeks. It is important to know however, if you develop severe or worsening pain, low back pain with fever, numbness, weakness or inability to walk or urinate, you should return to the ER immediately.  Please follow up with your doctor this week for a recheck if still having symptoms.  HOME INSTRUCTIONS Self - care:  The application of heat can help soothe the pain.  Maintaining your daily activities, including walking (this is encouraged), as it will help you get better faster than just staying in bed. Do not life, push, pull anything more than 10 pounds for the next week. I am attaching back exercises that you can do at home to help facilitate your recovery.   Back Exercises - I have attached a handout on back exercises that can be done at home to help facilitate your recovery.   Medications are also useful to help with pain control.   Acetaminophen.  This medication is generally safe, and found over the counter. Take as directed for your age. You should not take more than 8 of the extra strength (500mg ) pills a day (max dose is 4000mg  total OVER one day)  Non steroidal anti inflammatory: This includes medications including Ibuprofen, naproxen and Mobic; These medications help both pain and swelling and are very useful in treating back pain.  They should be taken with food, as they can cause stomach upset, and more seriously, stomach bleeding. Do not combine the medications.   Lidocaine Patch: Salon [place] lidocaine patches (blue and silver box) can be purchased over the counter and worn  for 12 hours for local pain relief    Muscle relaxants:  These medications can help with muscle tightness that is a cause of lower back pain.  Most of these medications can cause drowsiness, and it is not safe to drive or use dangerous machinery while taking them. They are primarily helpful when taken at night before sleep.  You will need to follow up with your primary healthcare provider or the Orthopedist in 1-2 weeks for reassessment and persistent symptoms.  Be aware that if you develop new symptoms, such as a fever, leg weakness, difficulty with or loss of control of your urine or bowels, abdominal pain, or more severe pain, you will need to seek medical attention and/or return to the Emergency department. Additional Information:  Your vital signs today were: BP 104/68 (BP Location: Right Arm)   Pulse 84   Temp 98.2 F (36.8 C) (Oral)   Resp 15   Ht 4\' 10"  (1.473 m)   Wt 72 kg   SpO2 99%   BMI 33.17 kg/m  If your blood pressure (BP) was elevated above 135/85 this visit, please have this repeated by your doctor within one month. ---------------

## 2020-10-06 NOTE — ED Triage Notes (Signed)
Pt here from home for R lower back that shoots down R leg into knee. Pt denies injury to back/hip, was told she had sciatic nerve pain 2 years ago. States it is hard to walk, pain woke her up last night.

## 2020-11-05 ENCOUNTER — Other Ambulatory Visit: Payer: Self-pay

## 2020-11-05 ENCOUNTER — Ambulatory Visit (INDEPENDENT_AMBULATORY_CARE_PROVIDER_SITE_OTHER): Payer: Medicaid Other

## 2020-11-05 DIAGNOSIS — Z3042 Encounter for surveillance of injectable contraceptive: Secondary | ICD-10-CM

## 2020-11-05 MED ORDER — MEDROXYPROGESTERONE ACETATE 150 MG/ML IM SUSP
150.0000 mg | Freq: Once | INTRAMUSCULAR | Status: AC
  Administered 2020-11-05: 150 mg via INTRAMUSCULAR

## 2020-11-05 NOTE — Progress Notes (Signed)
Date last pap: 05/16/2020 Last Depo-Provera: 08/20/2020 Side Effects if any: None Serum HCG indicated? NA Depo-Provera 150 mg IM given by: Philemon Kingdom, CMA Next appointment due: Oct. 4th - Oct. 18th

## 2020-11-05 NOTE — Progress Notes (Signed)
Patient was assessed and managed by nursing staff during this encounter. I have reviewed the chart and agree with the documentation and plan. I have also made any necessary editorial changes.  Catalina Antigua, MD 11/05/2020 6:58 PM

## 2020-11-08 ENCOUNTER — Other Ambulatory Visit (HOSPITAL_COMMUNITY): Payer: Self-pay

## 2020-11-11 ENCOUNTER — Encounter: Payer: Medicaid Other | Admitting: Internal Medicine

## 2020-11-18 ENCOUNTER — Other Ambulatory Visit (HOSPITAL_COMMUNITY): Payer: Self-pay

## 2020-11-18 MED FILL — Cabotegravir 600 MG/3ML & Rilpivirine 900 MG/3ML IM Susp ER: INTRAMUSCULAR | 34 days supply | Qty: 6 | Fill #1 | Status: AC

## 2020-11-19 ENCOUNTER — Telehealth: Payer: Self-pay

## 2020-11-19 NOTE — Telephone Encounter (Signed)
[facility] Patient Advocate Encounter  Patient's medication (cabenuva)have been couriered to [facility] from [facility] and will be administered on patient next office visit on 11/25/20.  [name] , CPhT Specialty Pharmacy Patient [facility] Phone: [phone] Fax:  [phone]

## 2020-11-25 ENCOUNTER — Ambulatory Visit (INDEPENDENT_AMBULATORY_CARE_PROVIDER_SITE_OTHER): Payer: Medicaid Other | Admitting: Family

## 2020-11-25 ENCOUNTER — Encounter: Payer: Self-pay | Admitting: Family

## 2020-11-25 ENCOUNTER — Other Ambulatory Visit: Payer: Self-pay

## 2020-11-25 VITALS — BP 109/72 | HR 96 | Temp 98.6°F | Wt 162.0 lb

## 2020-11-25 DIAGNOSIS — B20 Human immunodeficiency virus [HIV] disease: Secondary | ICD-10-CM | POA: Diagnosis not present

## 2020-11-25 MED ORDER — CABOTEGRAVIR & RILPIVIRINE ER 600 & 900 MG/3ML IM SUER
1.0000 | Freq: Once | INTRAMUSCULAR | Status: AC
  Administered 2020-11-25: 1 via INTRAMUSCULAR

## 2020-11-25 NOTE — Assessment & Plan Note (Signed)
Ms. Beryle Quant continues to have well controlled HIV disease with good adherence and tolerance to q 2 month Cabenuva. No signs/symptoms of opportunistic infection or progressive HIV. Reviewed previous lab work and discussed plan of care. Cabenuva injection provided without complication. Check HIV RNA level today. Plan for follow up in 2 months or sooner if needed with pharmacy staff followed by 4 months with provider.

## 2020-11-25 NOTE — Progress Notes (Signed)
Brief Narrative   Patient ID: Erica Walton, female    DOB: [birth year], 25 y.o.   MRN: [id number]  Erica Walton is a 25 y/o female with HIV disease diagnosed in 2013 with risk of heterosexual contact. No history of opportunistic infection. CD4 nadir of 300 entering care at [facility] Stage 2. Previous ART history of Truvada/Prezista/Ritonivir, Stribild, [place], and Tivicay/Truvada.   Subjective:    Chief Complaint  Patient presents with   Follow-up    B20     HPI:  Erica Walton is a 25 y.o. female with HIV disease last seen on 09/23/20 for Cabenuva injection with viral load being undetectable presenting today for q 2 month injection.  Ms. Erica Walton has been doing well since her previous injection having had about 3 days of soreness following her injection that was mitigated with hot showers and tiger balm. Overall feeling well today with no new concerns/complaints. Denies fevers, chills, night sweats, headaches, changes in vision, neck pain/stiffness, nausea, diarrhea, vomiting, lesions or rashes.   Allergies  Allergen Reactions   Norco [Hydrocodone-Acetaminophen] Rash   Penicillins Rash and Other (See Comments)    Has patient had a PCN reaction causing immediate rash, facial/tongue/throat swelling, SOB or lightheadedness with hypotension: yes, VOPF:[id number]} Has patient had a PCN reaction causing severe rash involving mucus membranes or skin necrosis: no:[id number]} Has patient had a PCN reaction that required hospitalization no:[id number]} Has patient had a PCN reaction occurring within the last 10 years: no:[id number]} If all of the above answers are "NO", then may proceed with Cephalosporin use.       Outpatient Medications Prior to Visit  Medication Sig Dispense Refill   albuterol (VENTOLIN HFA) 108 (90 Base) MCG/ACT inhaler Inhale 2 puffs into the lungs every 6 (six) hours as needed for wheezing or shortness of breath. 1 each 2   cabotegravir & rilpivirine ER (CABENUVA)  600 & 900 MG/3ML injection INJECT 1 KIT MONTHLY FOR 1 MONTH AND THEN EVERY OTHER MONTH. 6 mL 5   escitalopram (LEXAPRO) 20 MG tablet TAKE 1 TABLET BY MOUTH EVERY DAY 90 tablet 1   fluticasone (FLONASE) 50 MCG/ACT nasal spray Place 1 spray into both nostrils daily. 16 g 0   medroxyPROGESTERone (DEPO-PROVERA) 150 MG/ML injection Inject 1 mL (150 mg total) into the muscle every 3 (three) months. 1 mL 3   methocarbamol (ROBAXIN) 500 MG tablet Take 1 tablet (500 mg total) by mouth 2 (two) times daily. 20 tablet 0   naproxen (NAPROSYN) 500 MG tablet Take 1 tablet (500 mg total) by mouth 2 (two) times daily. 30 tablet 0   oxyCODONE-acetaminophen (PERCOCET) 5-325 MG tablet Take 1 tablet by mouth every 8 (eight) hours as needed. 6 tablet 0   valACYclovir (VALTREX) 500 MG tablet Take 1 tablet (500 mg total) by mouth 2 (two) times daily. 60 tablet 6   No facility-administered medications prior to visit.     Past Medical History:  Diagnosis Date   Asthma    Genital herpes    HIV (human immunodeficiency virus infection) ([place])      Past Surgical History:  Procedure Laterality Date   APPENDECTOMY     LAPAROSCOPIC APPENDECTOMY N/A 07/22/2019   Procedure: APPENDECTOMY LAPAROSCOPIC;  Surgeon: Herbert Pun, MD;  Location: [facility] ORS;  Service: General;  Laterality: N/A;      Review of Systems  Constitutional:  Negative for appetite change, chills, diaphoresis, fatigue, fever and unexpected weight change.  Eyes:        Negative  for acute change in vision  Respiratory:  Negative for chest tightness, shortness of breath and wheezing.   Cardiovascular:  Negative for chest pain.  Gastrointestinal:  Negative for diarrhea, nausea and vomiting.  Genitourinary:  Negative for dysuria, pelvic pain and vaginal discharge.  Musculoskeletal:  Negative for neck pain and neck stiffness.  Skin:  Negative for rash.  Neurological:  Negative for seizures, syncope, weakness and headaches.  Hematological:   Negative for adenopathy. Does not bruise/bleed easily.  Psychiatric/Behavioral:  Negative for hallucinations.      Objective:    BP 109/72   Pulse 96   Temp 98.6 F (37 C) (Oral)   Wt 162 lb (73.5 kg)   SpO2 97%   BMI 33.86 kg/m  Nursing note and vital signs reviewed.  Physical Exam Constitutional:      General: She is not in acute distress.    Appearance: She is well-developed.  Eyes:     Conjunctiva/sclera: Conjunctivae normal.  Cardiovascular:     Rate and Rhythm: Normal rate and regular rhythm.     Heart sounds: Normal heart sounds. No murmur heard.   No friction rub. No gallop.  Pulmonary:     Effort: Pulmonary effort is normal. No respiratory distress.     Breath sounds: Normal breath sounds. No wheezing or rales.  Chest:     Chest wall: No tenderness.  Abdominal:     General: Bowel sounds are normal.     Palpations: Abdomen is soft.     Tenderness: There is no abdominal tenderness.  Musculoskeletal:     Cervical back: Neck supple.  Lymphadenopathy:     Cervical: No cervical adenopathy.  Skin:    General: Skin is warm and dry.     Findings: No rash.  Neurological:     Mental Status: She is alert and oriented to person, place, and time.  Psychiatric:        Behavior: Behavior normal.        Thought Content: Thought content normal.        Judgment: Judgment normal.     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 No signs/symptoms of opportunistic infection or progressive HIV. Reviewed previous lab work and discussed plan of care. Cabenuva injection provided without complication. Check HIV RNA level today. Plan for follow up in 2 months or sooner if needed  with pharmacy staff followed by 4 months with provider.        Relevant Orders   HIV-1 RNA quant-no reflex-bld     I am having Saraiyah M. Walton maintain her valACYclovir, medroxyPROGESTERone, cabotegravir & rilpivirine ER, escitalopram, albuterol, fluticasone, naproxen, methocarbamol, and oxyCODONE-acetaminophen. We administered cabotegravir & rilpivirine ER.   Meds ordered this encounter  Medications   cabotegravir & rilpivirine ER (CABENUVA) 600 & 900 MG/3ML injection 1 kit     Follow-up: Return in about 2 months (around 01/25/2021), or if symptoms worsen or fail to improve.   Terri Piedra, MSN, FNP-C Nurse Practitioner [facility] [place] number: [phone]

## 2020-11-25 NOTE — Patient Instructions (Signed)
Nice to see you.  We will check your lab work today.  Plan for follow up in 2 months with Cassie/Amanda  then 4 months with Stephanie/Greg for routine follow up.  Have a great day and stay safe!

## 2020-11-27 LAB — HIV-1 RNA QUANT-NO REFLEX-BLD
HIV 1 RNA Quant: NOT DETECTED Copies/mL
HIV-1 RNA Quant, Log: NOT DETECTED Log cps/mL

## 2021-01-02 ENCOUNTER — Encounter: Payer: Self-pay | Admitting: Obstetrics and Gynecology

## 2021-01-02 ENCOUNTER — Other Ambulatory Visit: Payer: Self-pay

## 2021-01-02 ENCOUNTER — Telehealth (INDEPENDENT_AMBULATORY_CARE_PROVIDER_SITE_OTHER): Payer: Medicaid Other | Admitting: Obstetrics and Gynecology

## 2021-01-02 DIAGNOSIS — R45851 Suicidal ideations: Secondary | ICD-10-CM

## 2021-01-02 DIAGNOSIS — Z3009 Encounter for other general counseling and advice on contraception: Secondary | ICD-10-CM

## 2021-01-02 NOTE — Progress Notes (Signed)
Virtual Visit via Telephone Note  I connected with Erica Walton on 01/02/21 at 10:35 AM EDT by telephone and verified that I am speaking with the correct person using two identifiers.  Pt is experiencing suicidal thoughts without a plan, depression, hair loss, HA's, low libido, hot flashes, mood swings. She feels sx's are related to the Depo.  PHQ9= 14

## 2021-01-02 NOTE — Progress Notes (Signed)
TELEHEALTH GYNECOLOGY VISIT ENCOUNTER NOTE  Provider location: [facility]   Patient location: Home  I connected with IZZABELLE Walton on 01/02/21 at 10:35 AM EDT by telephone and verified that I am speaking with the correct person using two identifiers. Patient was unable to do MyChart audiovisual encounter due to technical difficulties, she tried several times.    I discussed the limitations, risks, security and privacy concerns of performing an evaluation and management service by telephone and the availability of in person appointments. I also discussed with the patient that there may be a patient responsible charge related to this service. The patient expressed understanding and agreed to proceed.   History:  Erica Walton is a 25 y.o. [id number] female being evaluated today for depo provera concerns.   On visit nurse intake, patient endorsed suicidal thoughts on her depression screening.  Patient states she has weekly thoughts about wanting to harm herself; she has no plan and no thoughts of harming others.  She is on Lexapro with her ID doctors.  She states that she believes her s/s are getting worse  Patient has been on depo provera since her last delivery in December 2021 with last depo shot in mid July 2022.  For the past few months, she's had hair loss, hot flashes and headaches with low libido. She had never used depo provera before and only used nexplanon before which she did not like. Aside from a one month episode of AUB from April to May, she has not had any bleeding or periods with the depo provera     Past Medical History:  Diagnosis Date   Asthma    Genital herpes    HIV (human immunodeficiency virus infection) (HCC)    Past Surgical History:  Procedure Laterality Date   APPENDECTOMY     LAPAROSCOPIC APPENDECTOMY N/A 07/22/2019   Procedure: APPENDECTOMY LAPAROSCOPIC;  Surgeon: Carolan Shiver, MD;  Location: [facility]  ORS;  Service: General;  Laterality: N/A;   The following portions of the patient's history were reviewed and updated as appropriate: allergies, current medications, past family history, past medical history, past social history, past surgical history and problem list.   Review of Systems:  Pertinent items noted in HPI and remainder of comprehensive ROS otherwise negative.  Physical Exam:   General:  Alert, oriented and cooperative.   Mental Status: Normal mood and affect perceived. Normal judgment and thought content.  Physical exam deferred due to nature of the encounter  Labs and Imaging No results found for this or any previous visit (from the past 336 hour(s)). No results found.    Assessment and Plan:     1. Suicidal thoughts I advised the patient that I recommend she go to the [facility] 24/7 walk in clinic [address] or to an ED given her s/s. Patient states she has the directions and contact information for the location and I stressed to her to please go there for evaluation  2. Encounter for other general counseling or advice on contraception I told her that given her mood s/s I recommend coming off the depo provera in case that could be contributing to it in some way. She states that she wants more children. I told her that a BTL or vasectomy is an option if she desires to be done with having children, but since she wants to maybe have more in the future, I recommend an IUD with paragard being preferred over the  Mirena. She is interested in the nuvaring, but I'd have to check with her ID providers to see if that's okay.   I also told her to touch base with her ID providers as her s/s (hair loss, headaches, hot flashes, etc could be due to her IM cabenuva) as this was started after her delivery in 03/2020. Her VL is undetectable so I told her it is working well in that regard but for her to talk to her ID doctor to see if that could as be contributing to her s/s. Regardless, I told her  I recommend coming off the depo as it can make mood s/s worse.  RTC in mid October when she's due for another depo to see about what to do next for birth control.   I discussed the assessment and treatment plan with the patient. The patient was provided an opportunity to ask questions and all were answered. The patient agreed with the plan and demonstrated an understanding of the instructions.   The patient was advised to call back or seek an in-person evaluation/go to the ED if the symptoms worsen or if the condition fails to improve as anticipated.  I provided 25 minutes of non-face-to-face time during this encounter.   [place]wey[place] Bing, MD Center for [employer], [facility]

## 2021-01-20 ENCOUNTER — Other Ambulatory Visit (HOSPITAL_COMMUNITY): Payer: Self-pay

## 2021-01-20 MED FILL — Cabotegravir 600 MG/3ML & Rilpivirine 900 MG/3ML IM Susp ER: INTRAMUSCULAR | 34 days supply | Qty: 6 | Fill #2 | Status: AC

## 2021-01-21 ENCOUNTER — Telehealth: Payer: Self-pay

## 2021-01-21 NOTE — Telephone Encounter (Signed)
[facility] Patient Advocate Encounter  Patient's medication Erica Walton) have been couriered to [facility] from [facility] and will be administered on patient next office visit on 01/27/21.  [name] , CPhT Specialty Pharmacy Patient [facility] Phone: [phone] Fax:  [phone]

## 2021-01-27 ENCOUNTER — Ambulatory Visit: Payer: Medicaid Other | Admitting: Family

## 2021-01-27 ENCOUNTER — Other Ambulatory Visit: Payer: Self-pay

## 2021-01-27 ENCOUNTER — Ambulatory Visit (INDEPENDENT_AMBULATORY_CARE_PROVIDER_SITE_OTHER): Payer: Medicaid Other | Admitting: Family

## 2021-01-27 ENCOUNTER — Encounter: Payer: Self-pay | Admitting: Family

## 2021-01-27 VITALS — BP 121/85 | HR 102 | Temp 97.9°F | Wt 161.2 lb

## 2021-01-27 DIAGNOSIS — Z23 Encounter for immunization: Secondary | ICD-10-CM

## 2021-01-27 DIAGNOSIS — Z0000 Encounter for general adult medical examination without abnormal findings: Secondary | ICD-10-CM | POA: Diagnosis not present

## 2021-01-27 DIAGNOSIS — B20 Human immunodeficiency virus [HIV] disease: Secondary | ICD-10-CM | POA: Diagnosis not present

## 2021-01-27 DIAGNOSIS — Z30015 Encounter for initial prescription of vaginal ring hormonal contraceptive: Secondary | ICD-10-CM | POA: Insufficient documentation

## 2021-01-27 DIAGNOSIS — M940 Chondrocostal junction syndrome [Tietze]: Secondary | ICD-10-CM | POA: Insufficient documentation

## 2021-01-27 MED ORDER — CABOTEGRAVIR & RILPIVIRINE ER 600 & 900 MG/3ML IM SUER
1.0000 | Freq: Once | INTRAMUSCULAR | Status: AC
  Administered 2021-01-27: 1 via INTRAMUSCULAR

## 2021-01-27 NOTE — Patient Instructions (Signed)
Nice to see you.  We will check your lab work today.  Plan for follow up in 2 months or sooner if needed with lab work on the same day.  Have a great day and stay safe! 

## 2021-01-27 NOTE — Assessment & Plan Note (Signed)
   Discussed importance of safe sexual practice and condom usage.  Condoms offered and declined.  Influenza vaccine updated today. 

## 2021-01-27 NOTE — Assessment & Plan Note (Signed)
Erica Walton reported chest pain is consistent with costochondritis secondary to coughing related to her recent diagnosis of RSV.  Appears to be improving overall.  Continue with over-the-counter medications as needed for symptom relief and supportive care.  Follow-up if symptoms worsen or do not improve.

## 2021-01-27 NOTE — Assessment & Plan Note (Signed)
Ms. Beryle Quant continues to have well-controlled virus with good adherence/tolerance to [place].  No signs/symptoms of opportunistic infection.  Reviewed previous lab work and discussed plan of care.  Every 2 month injection of Cabenuva provided with no complication.  Recheck viral load and CD4 count.  Plan for follow-up in 2 months or sooner if needed.

## 2021-01-27 NOTE — Progress Notes (Signed)
Brief Narrative   Patient ID: Erica Walton, female    DOB: 1995/12/18, 25 y.o.   MRN: [id number]  Ms. Walton is a 25 y/o female with HIV disease diagnosed in 2013 with risk of heterosexual contact. No history of opportunistic infection. CD4 nadir of 300 entering care at [facility] Stage 2. Previous ART history of Truvada/Prezista/Ritonivir, Stribild, Villanueva, and Tivicay/Truvada.   Subjective:    Chief Complaint  Patient presents with   Follow-up    Flu shot, chest pain two weeks ago.    HPI:  Erica Walton is a 25 y.o. female with HIV disease last seen on 11/25/20 with well controlled virus and good tolerance to her q 2 monthly Cabenuva. Viral load was undetectable. Here today for next q 2 monthly injection of Cabenuva.  Ms. Erica Walton continues to do well since her last injection. She has subsequently been diagnosed with RSV and continues to have couch and chest pain. Chest pain with breathing is uncomfortable. No additional fevers. Soreness following injection remains well controlled with warm showers and tiger balm. Depression continues to wax and wane. Denies fevers, chills, night sweats, headaches, changes in vision, neck pain/stiffness, nausea, diarrhea, vomiting, lesions or rashes.    Allergies  Allergen Reactions   Norco [Hydrocodone-Acetaminophen] Rash   Penicillins Rash and Other (See Comments)    Has patient had a PCN reaction causing immediate rash, facial/tongue/throat swelling, SOB or lightheadedness with hypotension: yes, [id number]} Has patient had a PCN reaction causing severe rash involving mucus membranes or skin necrosis: no:[id number]} Has patient had a PCN reaction that required hospitalization no:[id number]} Has patient had a PCN reaction occurring within the last 10 years: no:[id number]} If all of the above answers are "NO", then may proceed with Cephalosporin use.       Outpatient Medications Prior to Visit  Medication Sig Dispense Refill    albuterol (VENTOLIN HFA) 108 (90 Base) MCG/ACT inhaler Inhale 2 puffs into the lungs every 6 (six) hours as needed for wheezing or shortness of breath. 1 each 2   cabotegravir & rilpivirine ER (CABENUVA) 600 & 900 MG/3ML injection INJECT 1 KIT MONTHLY FOR 1 MONTH AND THEN EVERY OTHER MONTH. 6 mL 5   escitalopram (LEXAPRO) 20 MG tablet TAKE 1 TABLET BY MOUTH EVERY DAY 90 tablet 1   fluticasone (FLONASE) 50 MCG/ACT nasal spray Place 1 spray into both nostrils daily. (Patient not taking: Reported on 01/02/2021) 16 g 0   medroxyPROGESTERone (DEPO-PROVERA) 150 MG/ML injection Inject 1 mL (150 mg total) into the muscle every 3 (three) months. 1 mL 3   methocarbamol (ROBAXIN) 500 MG tablet Take 1 tablet (500 mg total) by mouth 2 (two) times daily. 20 tablet 0   naproxen (NAPROSYN) 500 MG tablet Take 1 tablet (500 mg total) by mouth 2 (two) times daily. (Patient not taking: Reported on 01/02/2021) 30 tablet 0   oxyCODONE-acetaminophen (PERCOCET) 5-325 MG tablet Take 1 tablet by mouth every 8 (eight) hours as needed. (Patient not taking: Reported on 01/02/2021) 6 tablet 0   valACYclovir (VALTREX) 500 MG tablet Take 1 tablet (500 mg total) by mouth 2 (two) times daily. 60 tablet 6   No facility-administered medications prior to visit.     Past Medical History:  Diagnosis Date   Asthma    Genital herpes    HIV (human immunodeficiency virus infection) ([place])      Past Surgical History:  Procedure Laterality Date   APPENDECTOMY     LAPAROSCOPIC APPENDECTOMY N/A 07/22/2019  Procedure: APPENDECTOMY LAPAROSCOPIC;  Surgeon: Herbert Pun, MD;  Location: [facility] ORS;  Service: General;  Laterality: N/A;    Review of Systems  Constitutional:  Negative for appetite change, chills, diaphoresis, fatigue, fever and unexpected weight change.  Eyes:        Negative for acute change in vision  Respiratory:  Positive for cough and chest tightness. Negative for shortness of breath and wheezing.    Cardiovascular:  Negative for chest pain.  Gastrointestinal:  Negative for diarrhea, nausea and vomiting.  Genitourinary:  Negative for dysuria, pelvic pain and vaginal discharge.  Musculoskeletal:  Negative for neck pain and neck stiffness.  Skin:  Negative for rash.  Neurological:  Negative for seizures, syncope, weakness and headaches.  Hematological:  Negative for adenopathy. Does not bruise/bleed easily.  Psychiatric/Behavioral:  Negative for hallucinations.      Objective:    BP 121/85   Pulse (!) 102   Temp 97.9 F (36.6 C) (Oral)   Wt 161 lb 3.2 oz (73.1 kg)   BMI 33.69 kg/m  Nursing note and vital signs reviewed.  Physical Exam Constitutional:      General: She is not in acute distress.    Appearance: She is well-developed.  Cardiovascular:     Rate and Rhythm: Normal rate and regular rhythm.     Heart sounds: Normal heart sounds.  Pulmonary:     Effort: Pulmonary effort is normal.     Breath sounds: Normal breath sounds.  Skin:    General: Skin is warm and dry.  Neurological:     Mental Status: She is alert and oriented to person, place, and time.  Psychiatric:        Behavior: Behavior normal.        Thought Content: Thought content normal.        Judgment: Judgment normal.     Depression screen [facility] 2/9 01/02/2021 09/23/2020 06/21/2020 05/10/2020 12/14/2019  Decreased Interest 2 0 0 1 0  Down, Depressed, Hopeless 2 0 0 1 0  PHQ - 2 Score 4 0 0 2 0  Altered sleeping 2 - - 3 -  Tired, decreased energy 2 - - 3 -  Change in appetite 1 - - 1 -  Feeling bad or failure about yourself  3 - - 0 -  Trouble concentrating 1 - - 3 -  Moving slowly or fidgety/restless 0 - - 0 -  Suicidal thoughts 1 - - 3 -  PHQ-9 Score 14 - - 15 -  Difficult doing work/chores Extremely dIfficult - - - -       Assessment & Plan:    Patient Active Problem List   Diagnosis Date Noted   Costochondritis 01/27/2021   Healthcare maintenance 01/27/2021   Suicidal thoughts 01/02/2021    Low back pain 09/23/2020   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 Walton's reported chest pain is consistent with costochondritis secondary to coughing related to her recent diagnosis of RSV.  Appears to be improving overall.  Continue with over-the-counter medications as needed for symptom relief and supportive care.  Follow-up if symptoms worsen or do not improve.        Other   Human immunodeficiency virus (HIV) disease ([place]) - Primary  Ms. Erica Walton continues to have well-controlled virus with good adherence/tolerance to [place].  No signs/symptoms of opportunistic infection.  Reviewed previous lab work and discussed plan of care.  Every 2 month injection of Cabenuva provided with no complication.  Recheck viral load and CD4 count.  Plan for follow-up in 2 months or sooner if needed.      Relevant Orders   HIV-1 RNA quant-no reflex-bld   T-helper cell (CD4)- ([facility] clinic only)   Healthcare maintenance    Discussed importance of safe sexual practice and condom usage.  Condoms offered and declined. Influenza vaccine updated today.      Other Visit Diagnoses     Need for immunization against influenza       Relevant Orders   Flu Vaccine QUAD 31moIM (Fluarix, Fluzone & Alfiuria Quad PF) (Completed)        I am having Erica Walton maintain her valACYclovir, medroxyPROGESTERone, cabotegravir & rilpivirine ER, escitalopram, albuterol, fluticasone, naproxen, methocarbamol, and oxyCODONE-acetaminophen. We administered cabotegravir & rilpivirine ER.   Meds ordered this encounter  Medications   cabotegravir & rilpivirine ER (CABENUVA) 600 & 900 MG/3ML injection 1 kit     Follow-up: Return in about 2 months (around  03/29/2021), or if symptoms worsen or fail to improve.   GTerri Piedra MSN, FNP-C Nurse Practitioner [facility] Centerfor [facility] Disease CMount Ple[place]number: [phone]

## 2021-01-28 LAB — T-HELPER CELL (CD4) - (RCID CLINIC ONLY)
CD4 % Helper T Cell: 39 % (ref 33–65)
CD4 T Cell Abs: 990 /uL (ref 400–1790)

## 2021-01-30 LAB — HIV-1 RNA QUANT-NO REFLEX-BLD
HIV 1 RNA Quant: <20 Copies/mL — ABNORMAL HIGH
HIV-1 RNA Quant, Log: <1.3 Log cps/mL — ABNORMAL HIGH

## 2021-02-11 IMAGING — US US ABDOMEN LIMITED
1 series · 14 of 25 positions shown · non-contrast
Comparison: None.

CLINICAL DATA: Right-sided abdominal pain

EXAM:
ULTRASOUND ABDOMEN LIMITED RIGHT UPPER QUADRANT

[Series 1: us abdomen limited ruq · 14 of 48 slices shown]
[im 1/48]
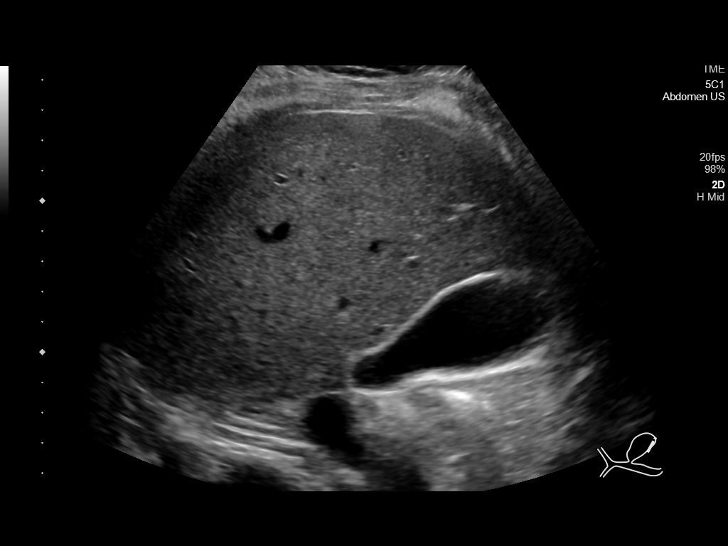
[im 4/48]
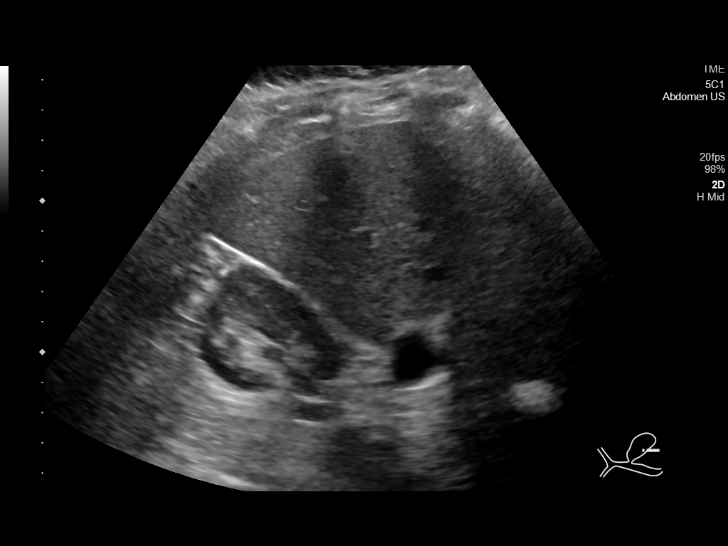
[im 8/48]
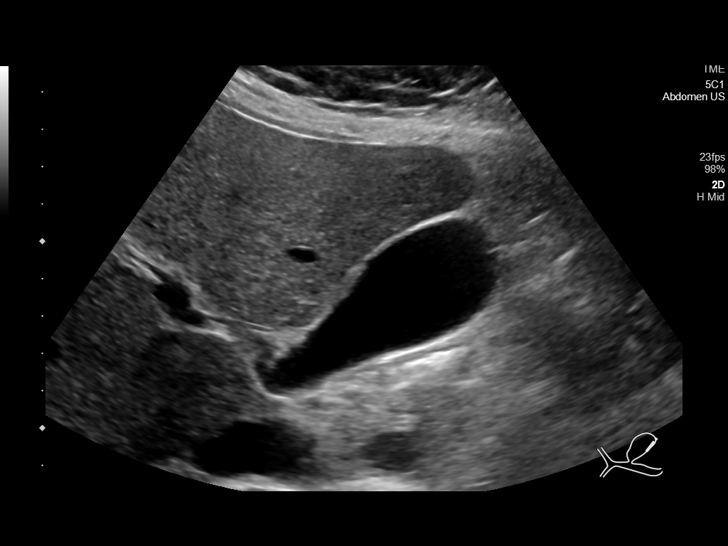
[im 12/48]
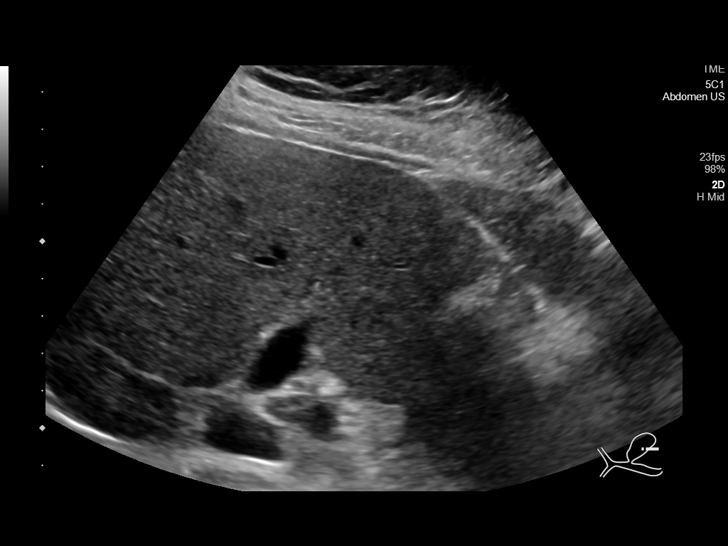
[im 16/48]
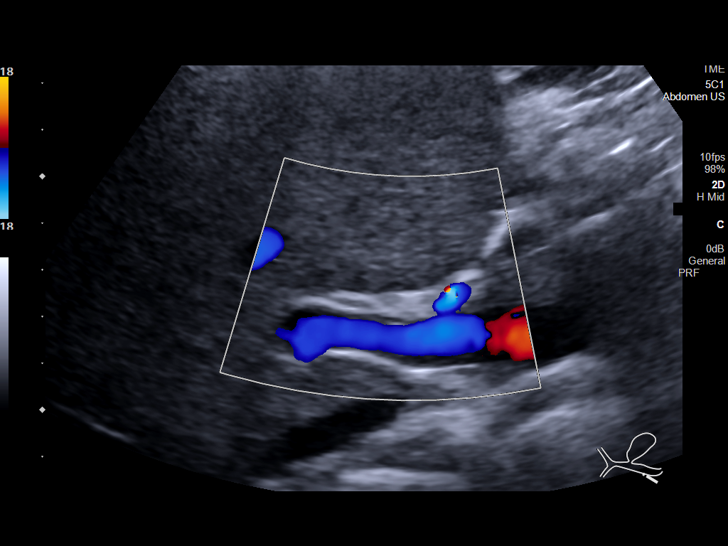
[im 18/48]
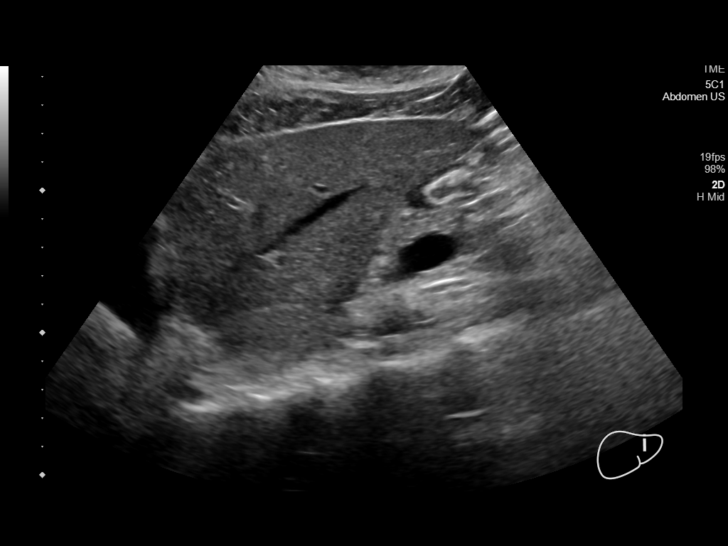
[im 22/48]
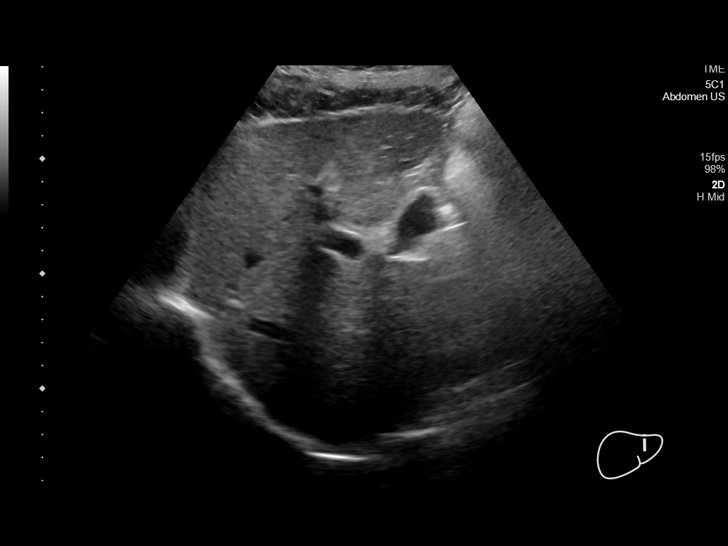
[im 26/48]
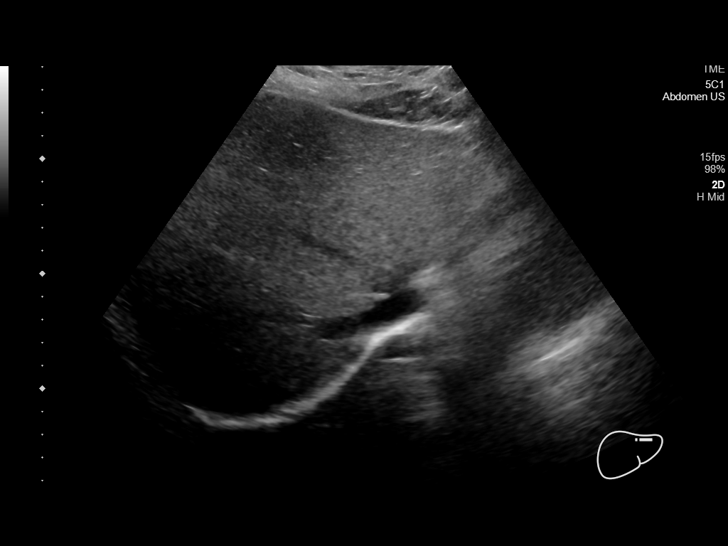
[im 30/48]
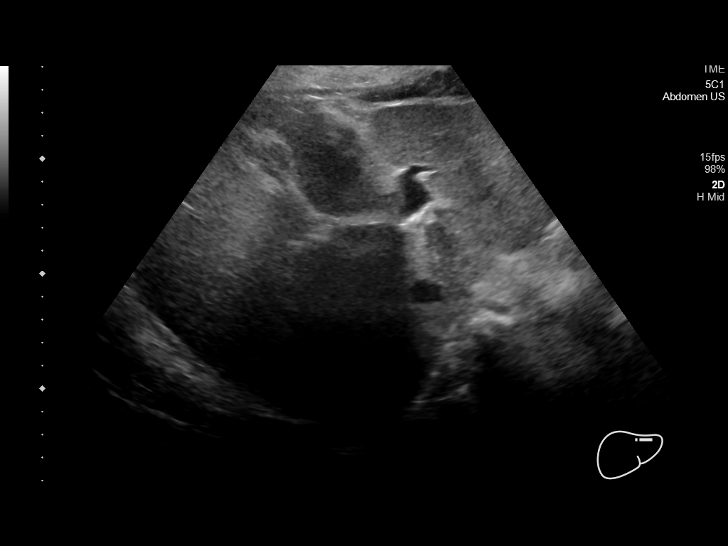
[im 32/48]
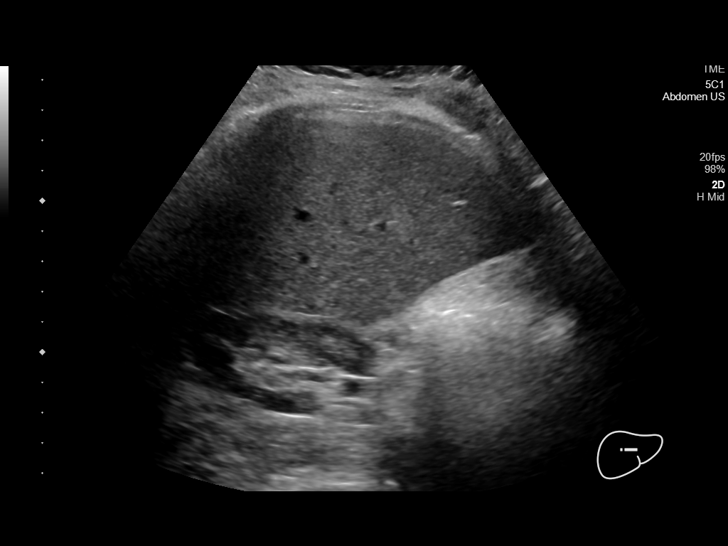
[im 36/48]
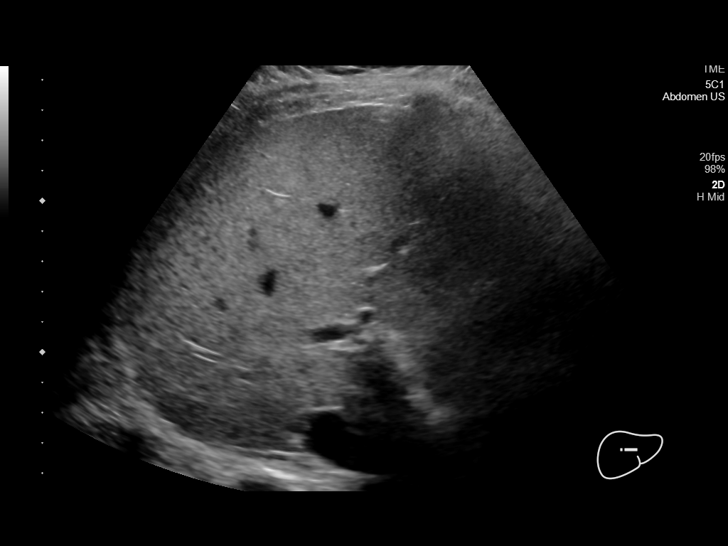
[im 40/48]
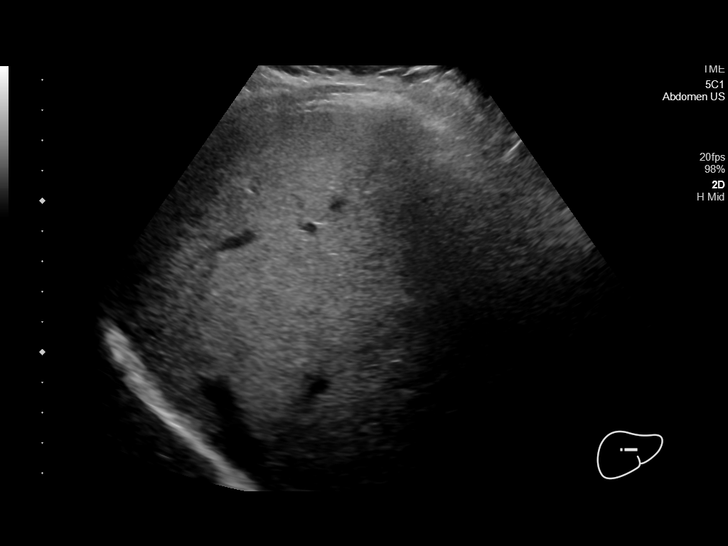
[im 44/48]
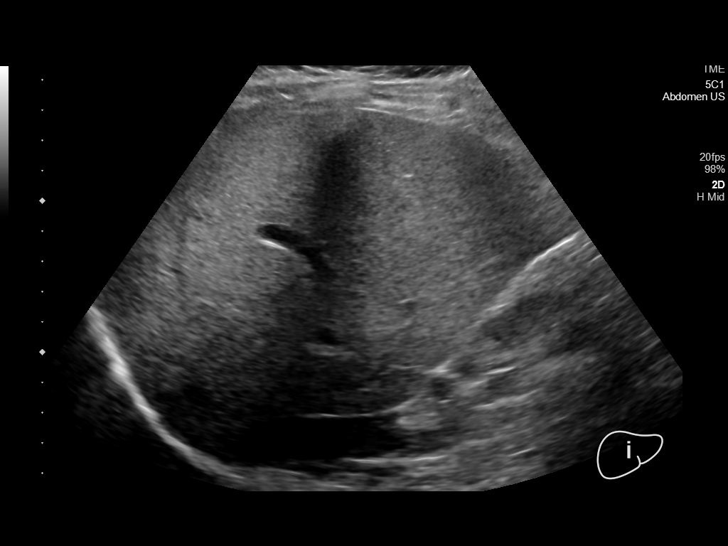
[im 48/48]
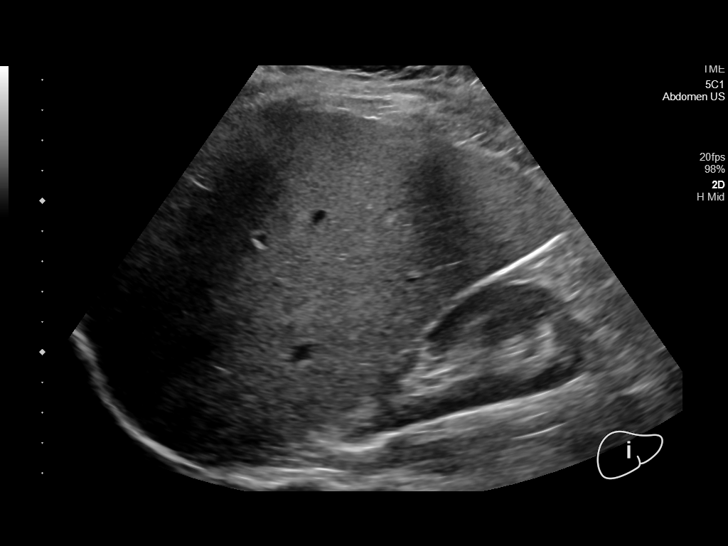

[14 of 25 positions shown; findings below may reference images not displayed]

FINDINGS: Gallbladder:

No gallstones or wall thickening visualized. No sonographic Murphy
sign noted by sonographer.

Common bile duct:

Diameter: 1 mm

Liver:

No focal lesion identified. Within normal limits in parenchymal
echogenicity. Portal vein is patent on color Doppler imaging with
normal direction of blood flow towards the liver.

Other: None.
IMPRESSION: Normal right upper quadrant ultrasound

## 2021-02-11 IMAGING — CT CT ABD-PELV W/ CM
2 of 4 series · 15 of 46 positions shown, 17 images · IV contrast (omnipaque)
Comparison: None.

CLINICAL DATA: Right lower quadrant pain

EXAM:
CT ABDOMEN AND PELVIS WITH CONTRAST
TECHNIQUE: Multidetector CT imaging of the abdomen and pelvis was performed
using the standard protocol following bolus administration of
intravenous contrast.
CONTRAST:  100mL OMNIPAQUE IOHEXOL 300 MG/ML  SOLN

[Series 2: routine abd/pel with · axial · 0.73mm/px · z∈[-869,-439]mm · 12 of 94 slices shown, 14 images]
[im 4/94  soft-tissue]
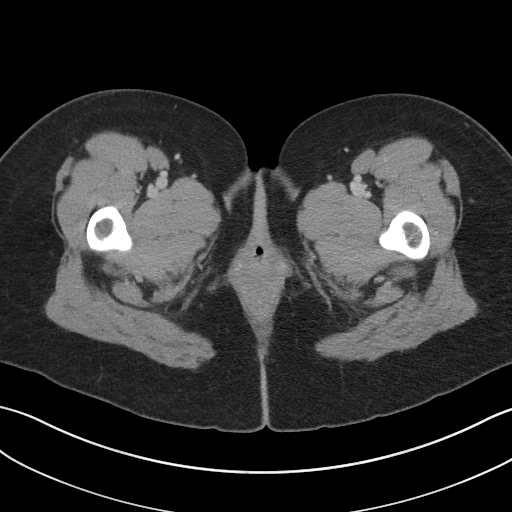
[im 4/94  bone]
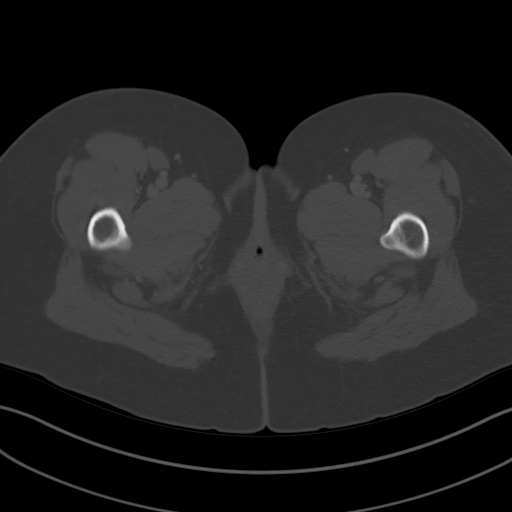
[im 12/94  soft-tissue]
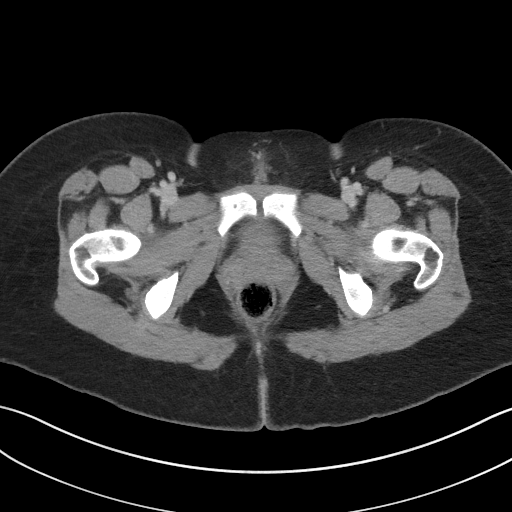
[im 20/94  soft-tissue]
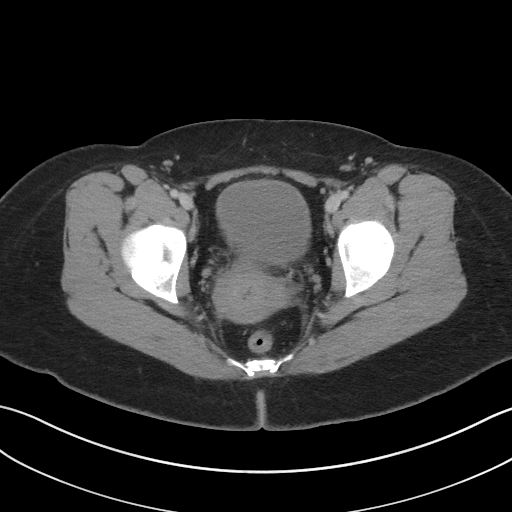
[im 28/94  soft-tissue]
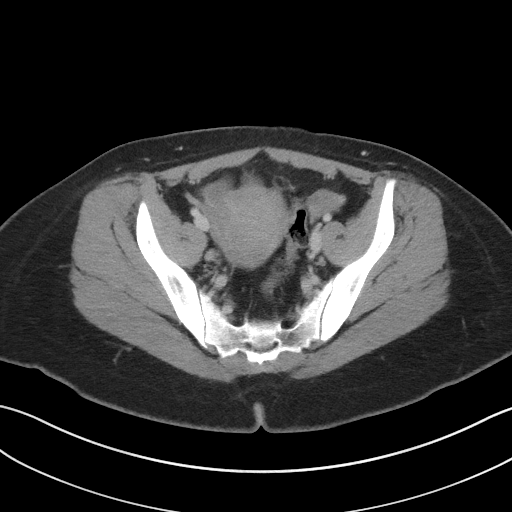
[im 35/94  soft-tissue]
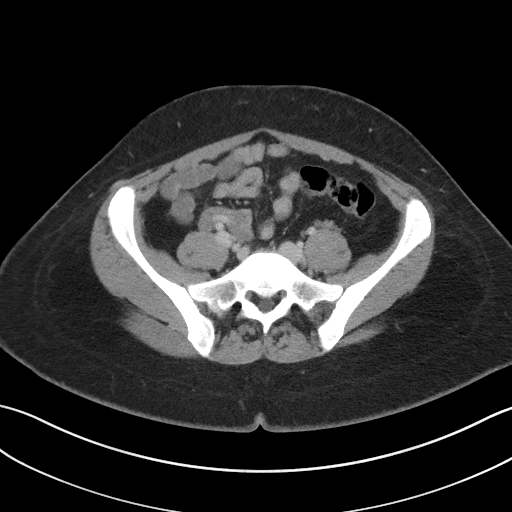
[im 43/94  soft-tissue]
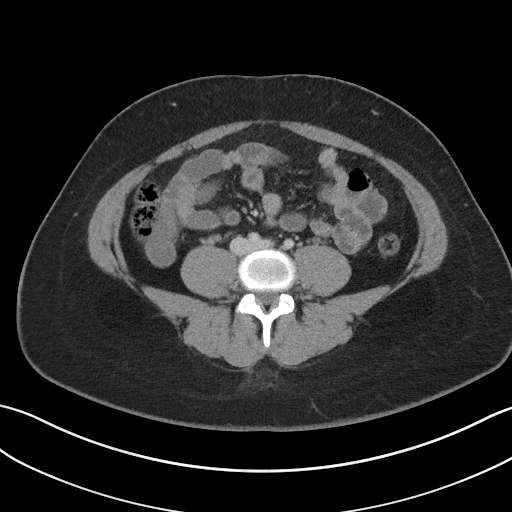
[im 51/94  soft-tissue]
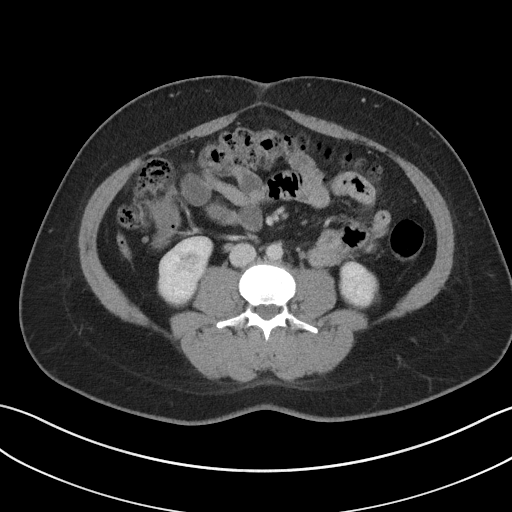
[im 59/94  soft-tissue]
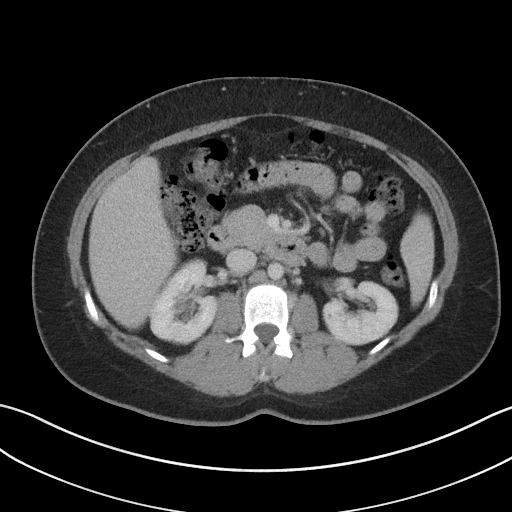
[im 66/94  soft-tissue]
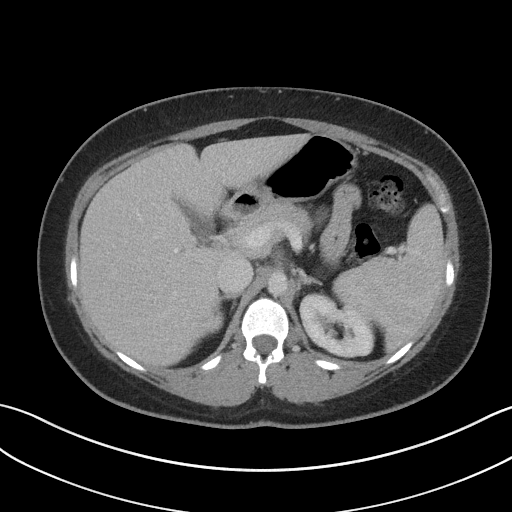
[im 66/94  bone]
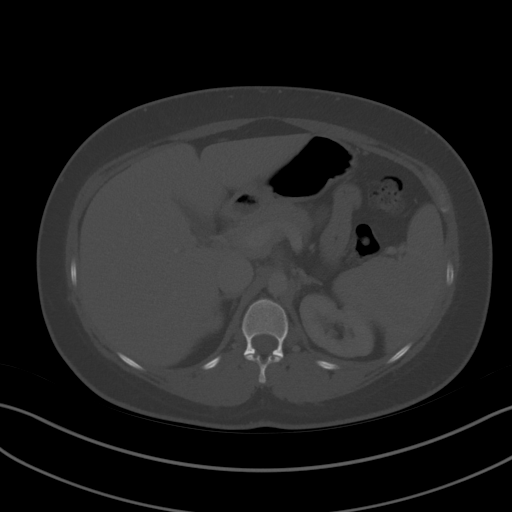
[im 74/94  soft-tissue]
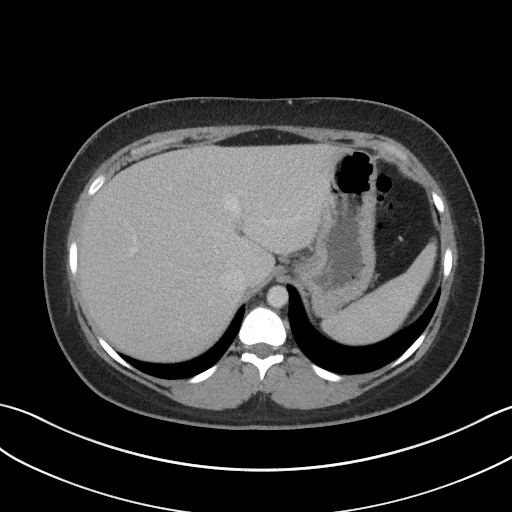
[im 82/94  soft-tissue]
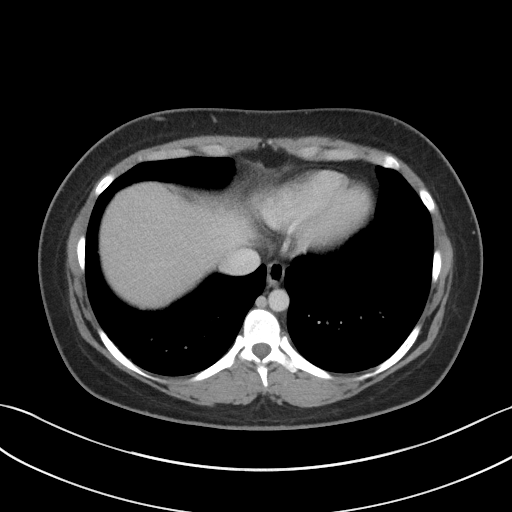
[im 90/94  soft-tissue]
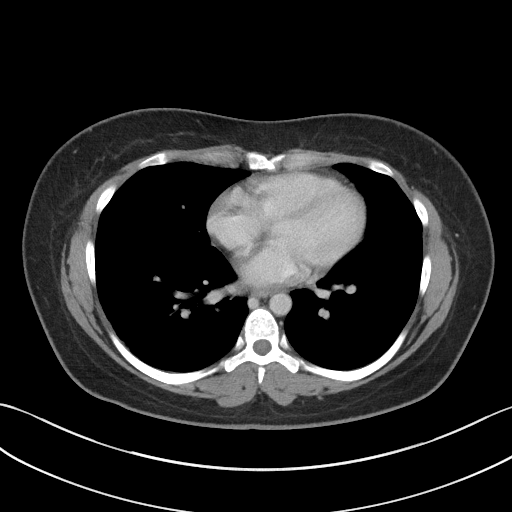

[Series 5: coronal st · coronal · 0.61mm/px · 3 of 78 slices shown]
[im 26/78  soft-tissue]
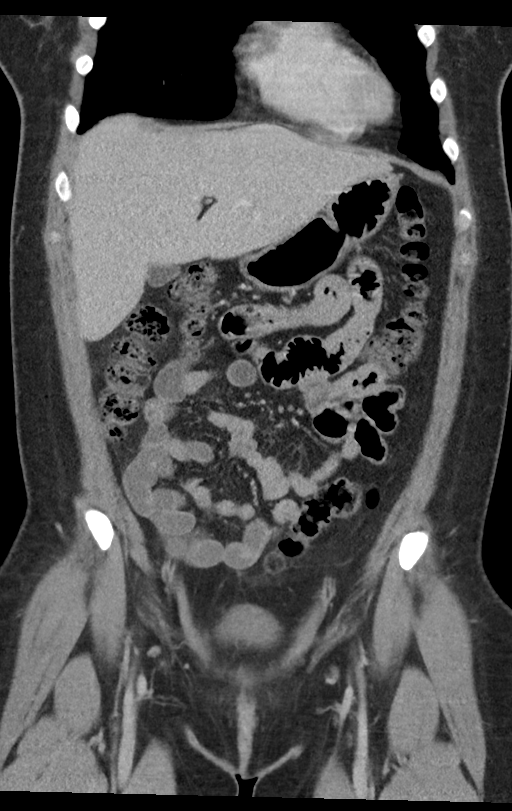
[im 35/78  soft-tissue]
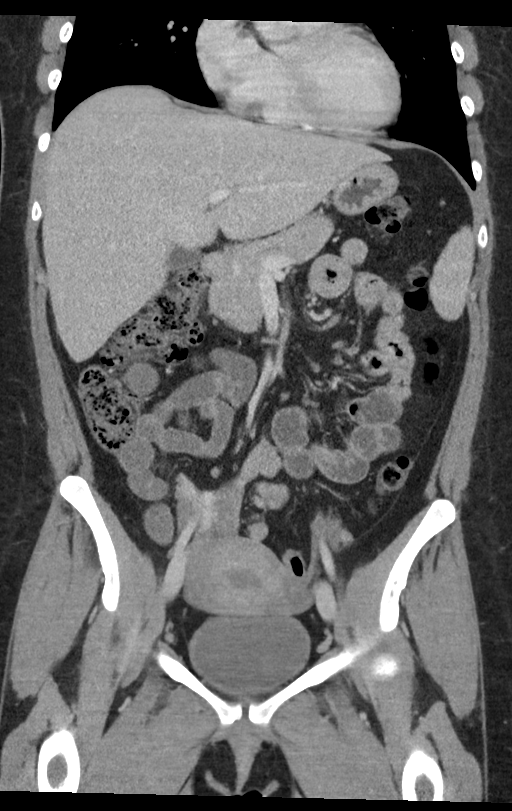
[im 43/78  soft-tissue]
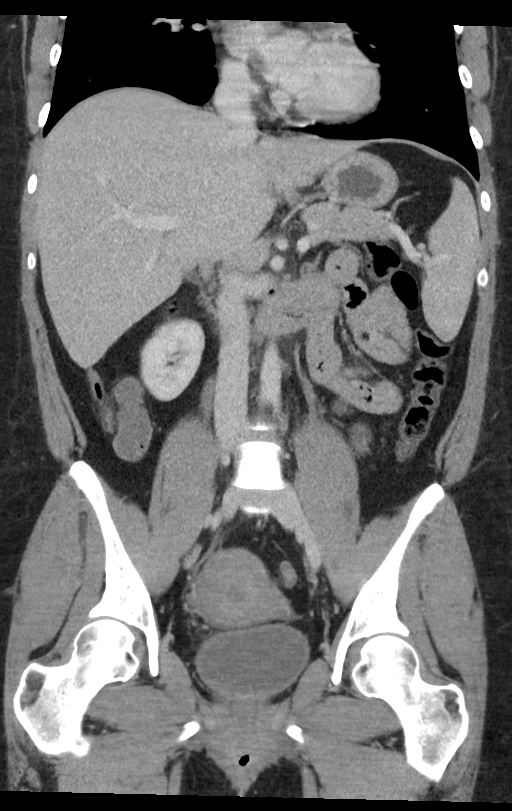

[15 of 46 positions shown; findings below may reference images not displayed]

FINDINGS: LOWER CHEST: Normal.

HEPATOBILIARY: Normal hepatic contours. No intra- or extrahepatic
biliary dilatation. Normal gallbladder.

PANCREAS: Normal pancreas. No ductal dilatation or peripancreatic
fluid collection.

SPLEEN: Normal.

ADRENALS/URINARY TRACT: The adrenal glands are normal. No
hydronephrosis, nephroureterolithiasis or solid renal mass. The
urinary bladder is normal for degree of distention

STOMACH/BOWEL: There is no hiatal hernia. Normal duodenal course and
caliber. No small bowel dilatation or inflammation. No focal colonic
abnormality.

Appendix location: Retrocecal (11 o'clock)

Diameter: 5 mm

Appendicolith: None

Mucosal hyper-enhancement: Very mild adjacent fat stranding

Extraluminal gas: None

Periappendiceal collection: None

VASCULAR/LYMPHATIC: Normal course and caliber of the major abdominal
vessels. No abdominal or pelvic lymphadenopathy.

REPRODUCTIVE: Normal uterus and ovaries.

MUSCULOSKELETAL. No bony spinal canal stenosis or focal osseous
abnormality.

OTHER: None.
IMPRESSION: 1. Mild fat stranding adjacent to the appendix could indicate early
acute appendicitis in the appropriate clinical context.
2. Otherwise normal CT of the abdomen and pelvis.

## 2021-02-11 IMAGING — US US PELVIS COMPLETE TRANSABD/TRANSVAG W DUPLEX
1 series · 13 of 25 positions shown · non-contrast
Comparison: None.

CLINICAL DATA: RIGHT-sided abdominal pain.

EXAM:
TRANSABDOMINAL AND TRANSVAGINAL ULTRASOUND OF PELVIS
DOPPLER ULTRASOUND OF OVARIES
TECHNIQUE: Both transabdominal and transvaginal ultrasound examinations of the
pelvis were performed. Transabdominal technique was performed for
global imaging of the pelvis including uterus, ovaries, adnexal
regions, and pelvic cul-de-sac.
It was necessary to proceed with endovaginal exam following the
transabdominal exam to visualize the adnexal structures to an
adequate degree. Color and duplex Doppler ultrasound was utilized to
evaluate blood flow to the ovaries.

[Series 1: us pelvic complete w transvaginal and torsion righ · 13 of 104 slices shown]
[im 1/104]
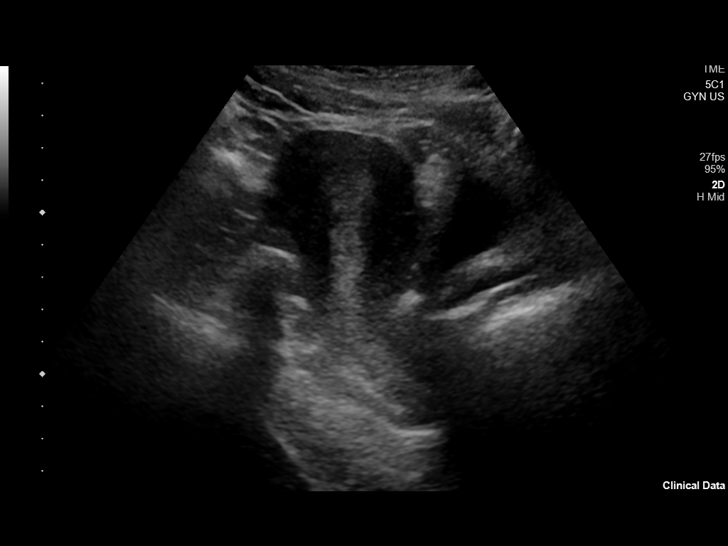
[im 9/104]
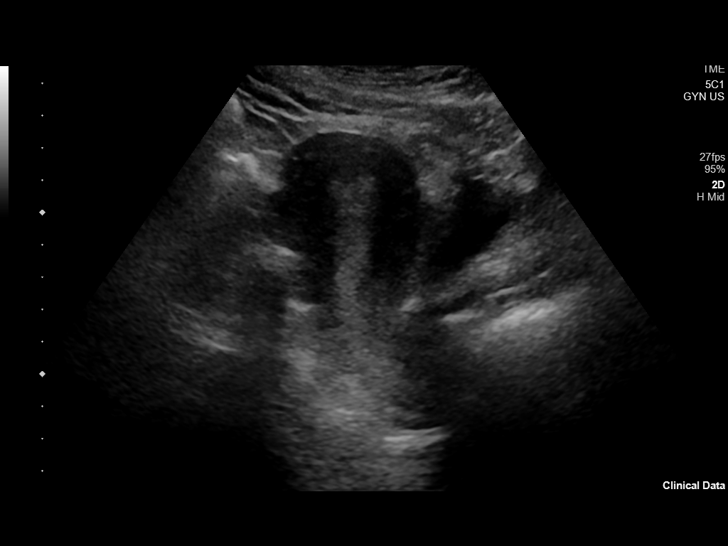
[im 18/104]
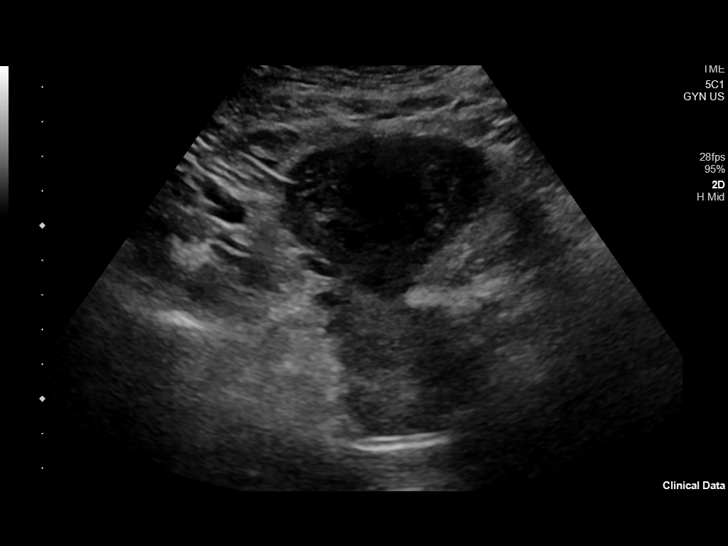
[im 26/104]
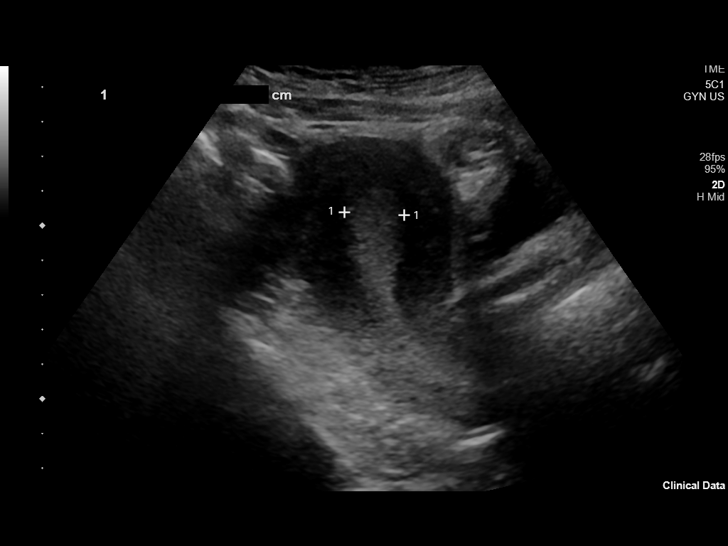
[im 35/104]
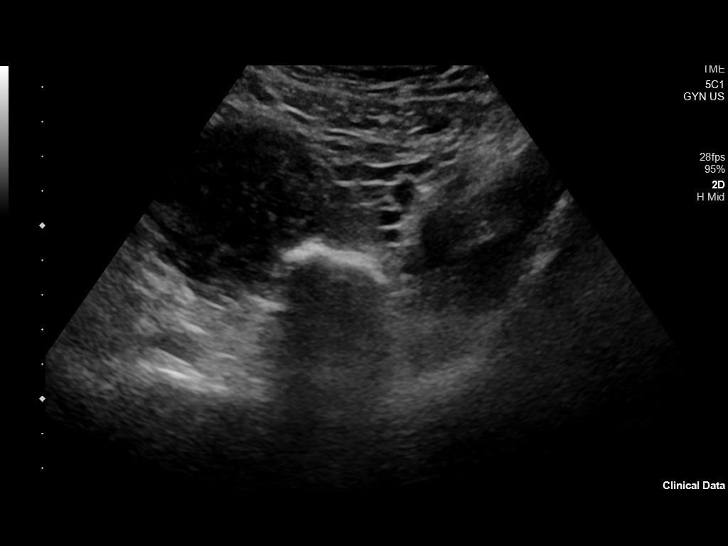
[im 43/104]
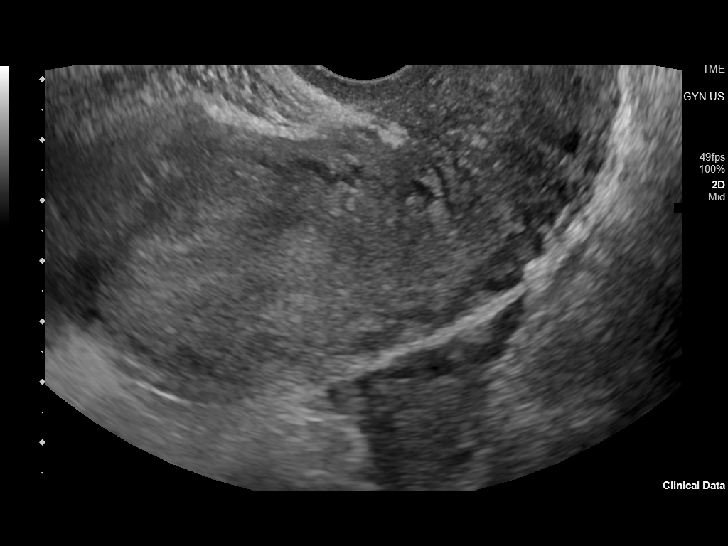
[im 52/104]
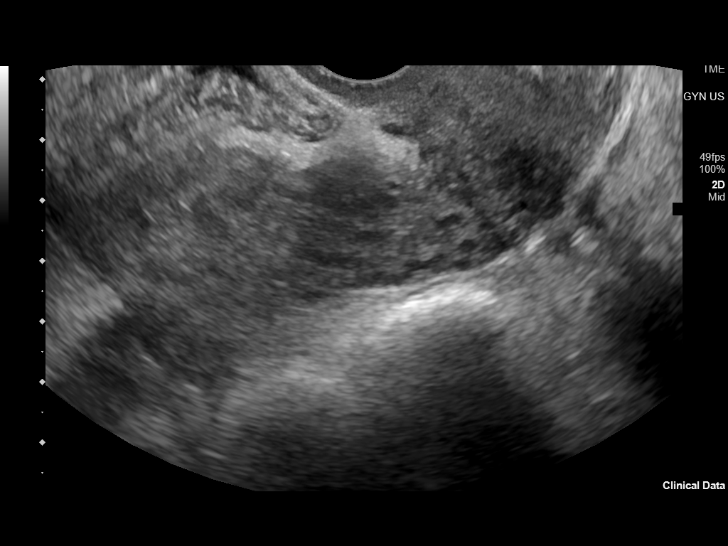
[im 61/104]
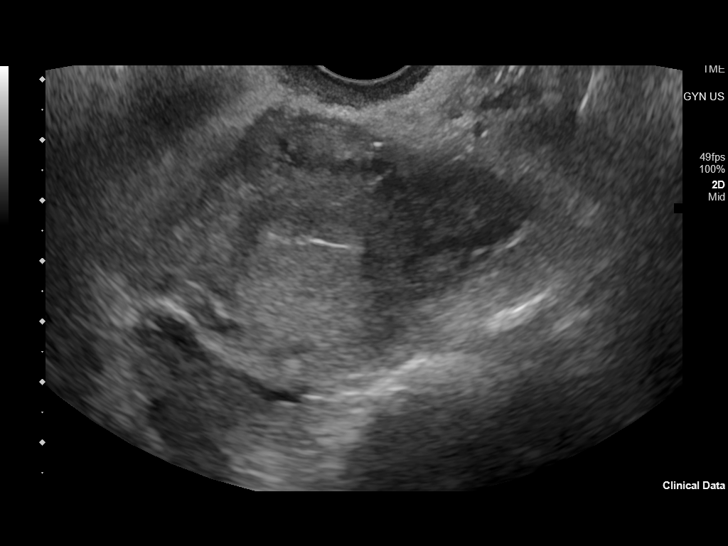
[im 69/104]
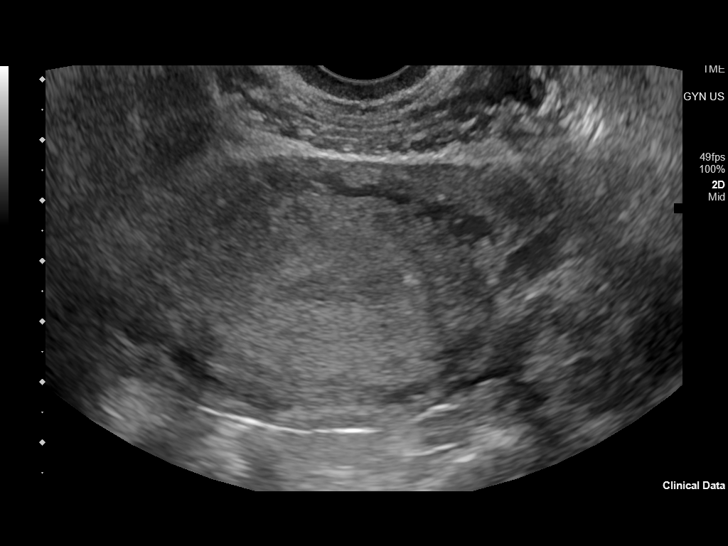
[im 78/104]
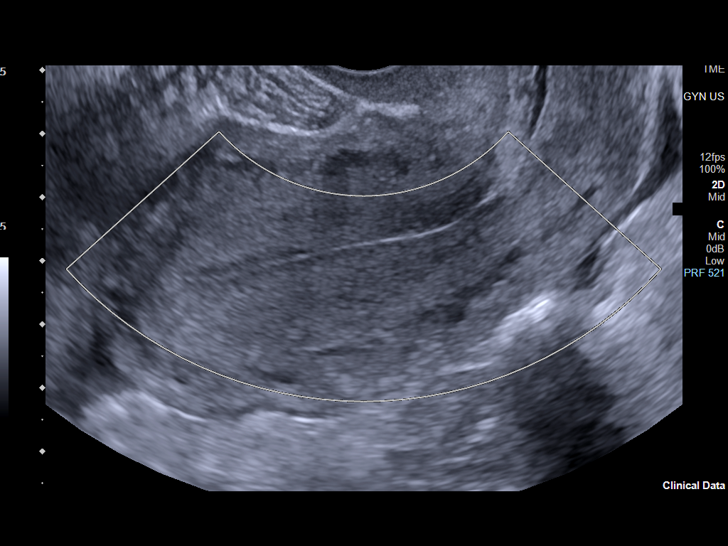
[im 86/104]
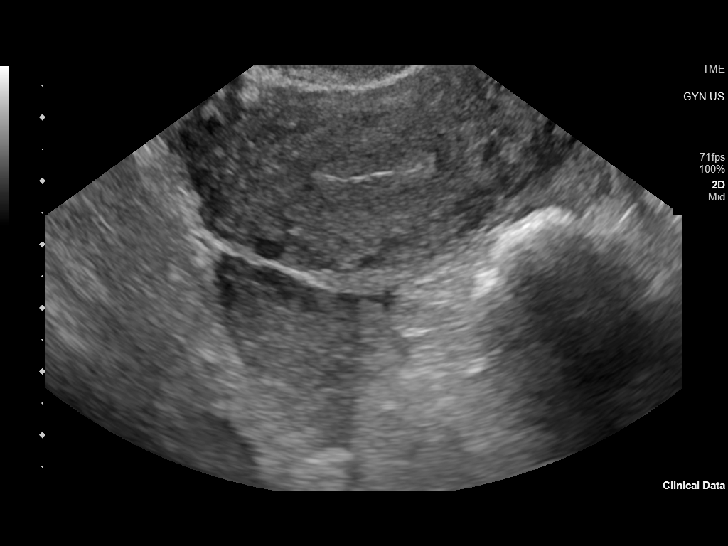
[im 95/104]
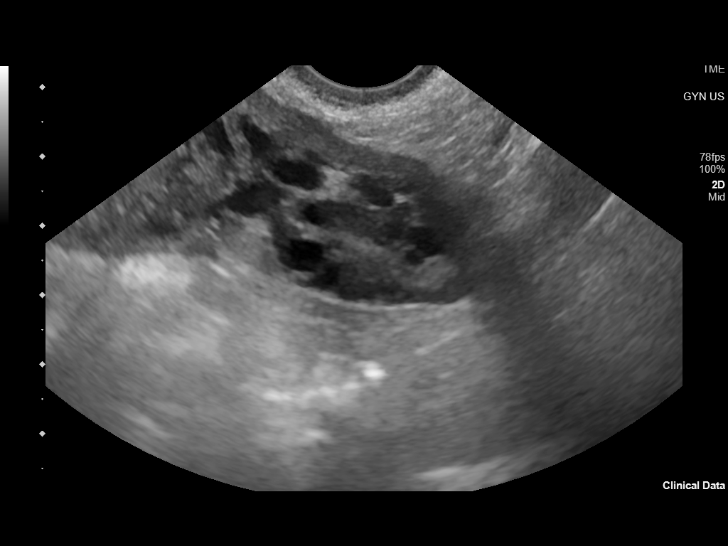
[im 104/104]
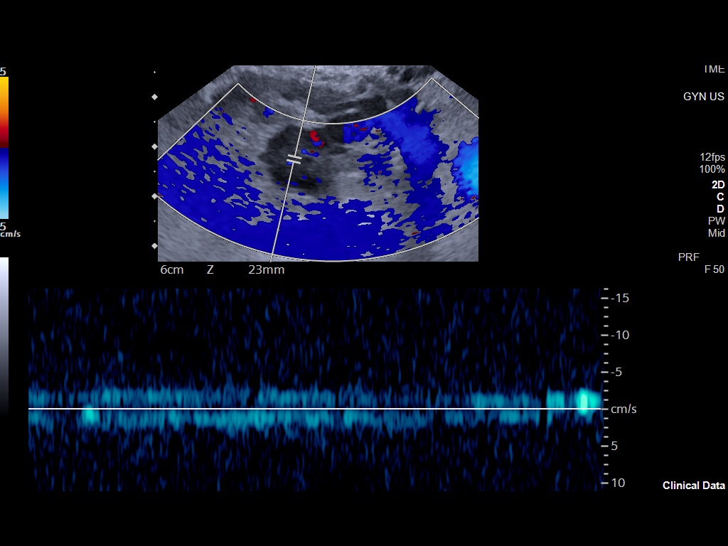

[13 of 25 positions shown; findings below may reference images not displayed]

FINDINGS: Uterus

Measurements: 9.8 x 4.7 x 5.8 cm = volume: 138 mL. No fibroids or
other mass visualized.

Endometrium

Thickness: 2 mm.  No focal abnormality visualized.

Right ovary

Measurements: 3.4 x 2.2 x 2.2 cm = volume: 8.7 mL. Normal
appearance/no adnexal mass.

Left ovary

Measurements: 3.2 x 1.3 x 2.0 cm = volume: 4.4w mL. Normal
appearance/no adnexal mass.

Pulsed Doppler evaluation of both ovaries demonstrates normal
low-resistance arterial and venous waveforms.

Other findings

No abnormal free fluid.
IMPRESSION: Normal pelvic ultrasound.  No evidence of ovarian torsion.

## 2021-02-12 ENCOUNTER — Ambulatory Visit: Payer: Medicaid Other

## 2021-02-18 ENCOUNTER — Encounter: Payer: Self-pay | Admitting: Obstetrics and Gynecology

## 2021-02-18 ENCOUNTER — Other Ambulatory Visit: Payer: Self-pay

## 2021-02-18 ENCOUNTER — Ambulatory Visit: Payer: Medicaid Other | Admitting: Obstetrics and Gynecology

## 2021-02-18 VITALS — BP 117/78 | HR 93 | Wt 159.4 lb

## 2021-02-18 DIAGNOSIS — Z3009 Encounter for other general counseling and advice on contraception: Secondary | ICD-10-CM | POA: Diagnosis not present

## 2021-02-18 MED ORDER — ETONOGESTREL-ETHINYL ESTRADIOL 0.12-0.015 MG/24HR VA RING: VAGINAL_RING | VAGINAL | 4 refills | Status: DC

## 2021-02-18 NOTE — Progress Notes (Signed)
Obstetrics and Gynecology Visit Return Patient Evaluation  Appointment Date: 02/18/2021  Primary Care Provider: Patient, No Pcp Per (Inactive)  OBGYN Clinic: [facility] Complaint: contraception management  History of Present Illness:  Erica Walton is a 25 y.o. who would like to switch from depo provera to nuva ring. Pt last received depo on 7/19 but had weight gain and increased depressive type s/s. She says that for the past few weeks she's had weight loss and improvement in her s/s. LMP about 10 days ago with sex a few days prior to that  Review of Systems: as noted in the History of Present Illness.   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 had no medications administered during this visit. Current Outpatient Medications  Medication Sig Dispense Refill   albuterol (VENTOLIN HFA) 108 (90 Base) MCG/ACT inhaler Inhale 2 puffs into the lungs every 6 (six) hours as needed for wheezing or shortness of breath. 1 each 2   cabotegravir & rilpivirine ER (CABENUVA) 600 & 900 MG/3ML injection INJECT 1 KIT MONTHLY FOR 1 MONTH AND THEN EVERY OTHER MONTH. 6 mL 5   escitalopram (LEXAPRO) 20 MG tablet TAKE 1 TABLET BY MOUTH EVERY DAY 90 tablet 1   methocarbamol (ROBAXIN) 500 MG tablet Take 1 tablet (500 mg total) by mouth 2 (two) times daily. 20 tablet 0   valACYclovir (VALTREX) 500 MG tablet Take 1 tablet (500 mg total) by mouth 2 (two) times daily. 60 tablet 6   No current facility-administered medications for this visit.    Allergies: is allergic to norco [hydrocodone-acetaminophen] and  penicillins.  Physical Exam:  BP 117/78   Pulse 93   Wt 159 lb 6.4 oz (72.3 kg)   BMI 33.31 kg/m  Body mass index is 33.31 kg/m. General appearance: Well nourished, well developed female in no acute distress.  Neuro/Psych:  Normal mood and affect.    Assessment: pt stable  Plan: D/w her ID NP (G. Calone) and nuvaring is okay with her regimen so this was sent in for her and recommend she abstain for another week, take home home UPT and if negative then okay to start. Pt with dysmenorrhea and menorrhagia and I told her that it may or may not help with her period s/s; she had help in bleeding but not in cramping with the depo previously.   RTC: 53mfor annual   CDurene RomansMD Attending Center for [employer](Fish farm manager

## 2021-02-23 ENCOUNTER — Other Ambulatory Visit: Payer: Self-pay | Admitting: Infectious Diseases

## 2021-02-24 NOTE — Telephone Encounter (Signed)
Please advise on refill.

## 2021-02-24 NOTE — Telephone Encounter (Signed)
Noted she was having some suicidal thoughts in late September - provided emergency services for 24/7 [facility] walk in eval.   Will FU at upcoming office visit to see if we need to refer to psych or add on SSRI. 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

## 2021-03-12 ENCOUNTER — Encounter: Payer: Self-pay | Admitting: Obstetrics & Gynecology

## 2021-03-12 ENCOUNTER — Other Ambulatory Visit (HOSPITAL_COMMUNITY): Payer: Self-pay

## 2021-03-12 MED FILL — Cabotegravir 600 MG/3ML & Rilpivirine 900 MG/3ML IM Susp ER: INTRAMUSCULAR | 34 days supply | Qty: 6 | Fill #3 | Status: AC

## 2021-03-15 ENCOUNTER — Emergency Department (HOSPITAL_COMMUNITY): Payer: Medicaid Other

## 2021-03-15 ENCOUNTER — Encounter (HOSPITAL_COMMUNITY): Payer: Self-pay | Admitting: Emergency Medicine

## 2021-03-15 ENCOUNTER — Emergency Department (HOSPITAL_COMMUNITY)
Admission: EM | Admit: 2021-03-15 | Discharge: 2021-03-15 | Disposition: A | Discharge status: Home / Self Care | Payer: Medicaid Other | Attending: Emergency Medicine | Admitting: Emergency Medicine

## 2021-03-15 ENCOUNTER — Other Ambulatory Visit: Payer: Self-pay

## 2021-03-15 ENCOUNTER — Ambulatory Visit (HOSPITAL_COMMUNITY)
Admission: EM | Admit: 2021-03-15 | Discharge: 2021-03-15 | Disposition: A | Discharge status: Home / Self Care | Payer: Medicaid Other

## 2021-03-15 DIAGNOSIS — X500XXA Overexertion from strenuous movement or load, initial encounter: Secondary | ICD-10-CM | POA: Diagnosis not present

## 2021-03-15 DIAGNOSIS — J45909 Unspecified asthma, uncomplicated: Secondary | ICD-10-CM | POA: Diagnosis not present

## 2021-03-15 DIAGNOSIS — M25511 Pain in right shoulder: Secondary | ICD-10-CM | POA: Diagnosis not present

## 2021-03-15 MED ORDER — KETOROLAC TROMETHAMINE 15 MG/ML IJ SOLN
15.0000 mg | Freq: Once | INTRAMUSCULAR | Status: AC
  Administered 2021-03-15: 15 mg via INTRAMUSCULAR
  Filled 2021-03-15: qty 1

## 2021-03-15 NOTE — ED Provider Notes (Signed)
Emergency Medicine Provider Triage Evaluation Note  Erica Walton , a 25 y.o. female  was evaluated in triage.  Pt  presents for evaluation of right shoulder pain and injury that occurred yesterday morning.  Per patient, she is closely being with her 69-year-old child who was sent.  She tried to pull away from the morning her arm got caught which caused her to wrench it to the side.  She notes pain started in the shoulder and has gradually been creeping up her neck.  New developing headache.  She been taking Tylenol without relief. Pain is worse with palpation and movement, she is holding arm to ribs..  Review of Systems  Positive: Right shoulder pain, right neck pain, headache Negative: Numbness, tingling  Physical Exam  BP 136/87   Pulse (!) 105   Temp 98.5 F (36.9 C)   Resp 16   SpO2 100%  Gen:   Awake, no distress   Resp:  Normal effort  MSK:   Exquisite tenderness to palpation around the shoulder joint.  No obvious deformities.  Patient is holding arm against her side for support. Other:    Medical Decision Making  Medically screening exam initiated at 1:16 PM.  Appropriate orders placed.  Erica Walton was informed that the remainder of the evaluation will be completed by another provider, this initial triage assessment does not replace that evaluation, and the importance of remaining in the ED until their evaluation is complete.     Janell Quiet, [place] 03/15/21 1317    Terald Sleeper, MD 03/15/21 1345

## 2021-03-15 NOTE — ED Provider Notes (Signed)
[facility] EMERGENCY DEPARTMENT Provider Note   CSN: [id number] Arrival date & time: 03/15/21  1219     History Chief Complaint  Patient presents with   Shoulder Pain   Neck Pain    Erica Walton is a 25 y.o. female.  25 yo F with chief complaints of right shoulder pain.  The patient states that a couple days ago she tried to roll over in bed while keeping her child in her arms and developed some severe right shoulder pain.  Tells me that is diffuse about the shoulder feels like it is radiating into the head and causing her to have a headache.  Pain with motion of the arm.  She denies numbness or weakness.  Denies overt trauma.  Has had an injury to that shoulder before was told that it could be a rotator cuff by an emergency provider but never sought follow-up.  The history is provided by the patient.  Shoulder Pain Location:  Shoulder Shoulder location:  R shoulder Injury: no   Pain details:    Quality:  Aching   Radiates to:  Does not radiate   Severity:  Moderate   Onset quality:  Gradual   Duration:  2 days   Timing:  Constant   Progression:  Worsening Handedness:  Right-handed Relieved by:  Nothing Worsened by:  Bearing weight, exercise and movement Ineffective treatments:  None tried Associated symptoms: neck pain   Associated symptoms: no fever   Neck Pain Associated symptoms: no chest pain, no fever and no headaches       Past Medical History:  Diagnosis Date   Asthma    Genital herpes    HIV (human immunodeficiency virus infection) ([place])     Patient Active Problem List   Diagnosis Date Noted   Costochondritis 01/27/2021   Healthcare maintenance 01/27/2021   Suicidal thoughts 01/02/2021   Low back pain 09/23/2020   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   Obesity affecting pregnancy 09/06/2019   Herpes 10/06/2013   Asthma 11/17/2011   Human immunodeficiency virus (HIV) disease ([place]) 11/17/2011    Past Surgical History:   Procedure Laterality Date   APPENDECTOMY     LAPAROSCOPIC APPENDECTOMY N/A 07/22/2019   Procedure: APPENDECTOMY LAPAROSCOPIC;  Surgeon: Herbert Pun, MD;  Location: [facility] ORS;  Service: General;  Laterality: N/A;     OB History     Gravida  5   Para  5   Term  5   Preterm      AB      Living  5      SAB      IAB      Ectopic      Multiple  0   Live Births  5           Family History  Problem Relation Age of Onset   Cancer Father    Breast cancer Other     Social History   Tobacco Use   Smoking status: Never   Smokeless tobacco: Never  Substance Use Topics   Alcohol use: Not Currently    Comment: 2018   Drug use: Never    Home Medications Prior to Admission medications   Medication Sig Start Date End Date Taking? Authorizing Provider  albuterol (VENTOLIN HFA) 108 (90 Base) MCG/ACT inhaler Inhale 2 puffs into the lungs every 6 (six) hours as needed for wheezing or shortness of breath. 09/23/20   [place] Callas, NP  cabotegravir & rilpivirine ER (CABENUVA)  600 & 900 MG/3ML injection INJECT 1 KIT MONTHLY FOR 1 MONTH AND THEN EVERY OTHER MONTH. 06/21/20 06/21/21  [place], FNP  escitalopram (LEXAPRO) 20 MG tablet TAKE 1 TABLET BY MOUTH EVERY DAY 02/24/21   [place] Callas, NP  etonogestrel-ethinyl estradiol (NUVARING) 0.12-0.015 MG/24HR vaginal ring Insert vaginally and leave in place for 3 consecutive weeks, then remove for 1 week. Take home pregnancy test in 1wk and if negative then can start the ring. 02/25/21   Aletha Halim, MD  methocarbamol (ROBAXIN) 500 MG tablet Take 1 tablet (500 mg total) by mouth 2 (two) times daily. 10/06/20   Jacqlyn Larsen, PA-C  valACYclovir (VALTREX) 500 MG tablet Take 1 tablet (500 mg total) by mouth 2 (two) times daily. 02/21/20   Caren Macadam, MD    Allergies    Norco [hydrocodone-acetaminophen] and Penicillins  Review of Systems   Review of Systems  Constitutional:  Negative for chills  and fever.  HENT:  Negative for congestion and rhinorrhea.   Eyes:  Negative for redness and visual disturbance.  Respiratory:  Negative for shortness of breath and wheezing.   Cardiovascular:  Negative for chest pain and palpitations.  Gastrointestinal:  Negative for nausea and vomiting.  Genitourinary:  Negative for dysuria and urgency.  Musculoskeletal:  Positive for arthralgias, myalgias and neck pain.  Skin:  Negative for pallor and wound.  Neurological:  Negative for dizziness and headaches.   Physical Exam Updated Vital Signs BP 136/87   Pulse (!) 105   Temp 98.5 F (36.9 C)   Resp 16   SpO2 100%   Physical Exam Vitals and nursing note reviewed.  Constitutional:      General: She is not in acute distress.    Appearance: She is well-developed. She is not diaphoretic.  HENT:     Head: Normocephalic and atraumatic.  Eyes:     Pupils: Pupils are equal, round, and reactive to light.  Cardiovascular:     Rate and Rhythm: Normal rate and regular rhythm.     Heart sounds: No murmur heard.   No friction rub. No gallop.  Pulmonary:     Effort: Pulmonary effort is normal.     Breath sounds: No wheezing or rales.  Abdominal:     General: There is no distension.     Palpations: Abdomen is soft.     Tenderness: There is no abdominal tenderness.  Musculoskeletal:        General: Tenderness present.     Cervical back: Normal range of motion and neck supple.     Comments: Pain diffusely about the right shoulder.  For me worse to the right trapezius muscle belly.  Pulse motor and sensation intact distally.  Unable to assess range of motion or rotator cuff due to discomfort.  Skin:    General: Skin is warm and dry.  Neurological:     Mental Status: She is alert and oriented to person, place, and time.  Psychiatric:        Behavior: Behavior normal.    ED Results / Procedures / Treatments   Labs (all labs ordered are listed, but only abnormal results are displayed) Labs  Reviewed - No data to display  EKG None  Radiology DG Shoulder Right  Result Date: 03/15/2021 CLINICAL DATA:  Acute RIGHT shoulder pain following injury 1 day ago. Initial encounter. EXAM: RIGHT SHOULDER - 2+ VIEW COMPARISON:  None. FINDINGS: There is no evidence of fracture or dislocation. There is no evidence  of arthropathy or other focal bone abnormality. Soft tissues are unremarkable. IMPRESSION: Negative. Electronically Signed   By: Margarette Canada M.D.   On: 03/15/2021 13:31    Procedures Procedures   Medications Ordered in ED Medications  ketorolac (TORADOL) 15 MG/ML injection 15 mg (has no administration in time range)    ED Course  I have reviewed the triage vital signs and the nursing notes.  Pertinent labs & imaging results that were available during my care of the patient were reviewed by me and considered in my medical decision making (see chart for details).    MDM Rules/Calculators/A&P                           25 yo F with a chief complaint of right shoulder pain.  Plain film viewed by me without fracture.  Exam limited secondary to pain.  Will place in a sling.  Sports medicine follow-up.  Tylenol and NSAIDs.  1:48 PM:  I have discussed the diagnosis/risks/treatment options with the patient and believe the pt to be eligible for discharge home to follow-up with Sports med. We also discussed returning to the ED immediately if new or worsening sx occur. We discussed the sx which are most concerning (e.g., sudden worsening pain, fever, inability to tolerate by mouth) that necessitate immediate return. Medications administered to the patient during their visit and any new prescriptions provided to the patient are listed below.  Medications given during this visit Medications  ketorolac (TORADOL) 15 MG/ML injection 15 mg (has no administration in time range)     The patient appears reasonably screen and/or stabilized for discharge and I doubt any other medical condition  or other [facility] requiring further screening, evaluation, or treatment in the ED at this time prior to discharge.   Final Clinical Impression(s) / ED Diagnoses Final diagnoses:  Acute pain of right shoulder    Rx / DC Orders ED Discharge Orders     None        Deno Etienne, DO 03/15/21 1348

## 2021-03-15 NOTE — ED Triage Notes (Signed)
C/o R shoulder and R sided neck pain since pulling her arm out from underneath her 27 yr old that was sleeping on her arm in the bed yesterday.

## 2021-03-15 NOTE — Discharge Instructions (Signed)
Take 4 over the counter ibuprofen tablets 3 times a day or 2 over-the-counter naproxen tablets twice a day for pain. Also take tylenol 1000mg(2 extra strength) four times a day.    

## 2021-03-15 NOTE — Progress Notes (Signed)
Orthopedic Tech Progress Note Patient Details:  Erica Walton 30-Oct-1995 [id number]  Ortho Devices Type of Ortho Device: Shoulder immobilizer Ortho Device/Splint Location: RUE Ortho Device/Splint Interventions: Ordered, Application, Adjustment   Post Interventions Patient Tolerated: Well Instructions Provided: Adjustment of device  [place] A Gerilyn Pilgrim 03/15/2021, 2:08 PM

## 2021-03-17 MED ORDER — VITAFOL FE+ 90-0.6-0.4-200 MG PO CAPS: ORAL_CAPSULE | ORAL | 11 refills | Status: DC

## 2021-03-19 ENCOUNTER — Other Ambulatory Visit: Payer: Self-pay

## 2021-03-19 ENCOUNTER — Ambulatory Visit: Payer: Medicaid Other | Admitting: Sports Medicine

## 2021-03-19 VITALS — BP 110/80 | HR 96 | Ht <= 58 in | Wt 159.0 lb

## 2021-03-19 DIAGNOSIS — M25511 Pain in right shoulder: Secondary | ICD-10-CM

## 2021-03-19 MED ORDER — MELOXICAM 15 MG PO TABS: 15.0000 mg | ORAL_TABLET | Freq: Every day | ORAL | 0 refills | Status: DC

## 2021-03-19 MED ORDER — CYCLOBENZAPRINE HCL 5 MG PO TABS: 5.0000 mg | ORAL_TABLET | Freq: Every day | ORAL | 0 refills | Status: DC

## 2021-03-19 NOTE — Patient Instructions (Signed)
Good to see you  Work note given  No lifting more than 5 pounds with right arm Meloxicam 15mg  for 2 weeks remainder as needed  Flexeril 5-10mg  max of 3 times a day ROM shoulder exercises  2 week follow up

## 2021-03-19 NOTE — Progress Notes (Signed)
Erica Walton D.[place] Phone: [phone]   Assessment and Plan:     1. Acute pain of right shoulder -Acute, unclear etiology, initial sports medicine visit - Unclear etiology of right shoulder pain with differential diagnosis rotator cuff strain versus subluxation - Start meloxicam 15 mg daily for 2 weeks then may take remainder as needed - Start Flexeril 5 to 10 mg up to 3 times a day.  Recommend primarily using at night as the medication may make patient drowsy - Work note provided the patient should not lift more than 5 pounds with right arm - Recommended discontinuing use of sling as this could promote shoulder stiffness - Start HEP for range of motion right shoulder   Pertinent previous records reviewed include right shoulder x-ray 03/15/2021, ER note 03/15/2021, lab work from 01/27/2021   Follow Up: 2 weeks for reevaluation.  Could consider ultrasound versus right shoulder CSI versus formal PT.   Subjective:    Chief Complaint: right shoulder pain   HPI:   03/19/21 Patient is a 25 year old female presenting with R shoulder pain that started a few days ago. Patient was trying to roll over in bed with child in her arms. Patient has pain with any movement, denies numbness and tingling. Patient was seen in the ED 03/15/21 and referred to sports medicine. Today patient states pain is on the top and into the neck,shoulder is very stiff and sore was given ROM exercises from ED but still feels pulling and pain with any movement. Patient is currently in a sling.  Patient has a 26-year-old child.  States she is currently starting her menstrual cycle.  Radiates: no Mechanical symptoms:feels popping Weakness:no Aggravates: all movements Treatments tried: Tylenol , ice and heat    Relevant Historical Information: Rotator cuff injury R shoulder.  History HIV.  Additional pertinent review of systems  negative.   Current Outpatient Medications:    albuterol (VENTOLIN HFA) 108 (90 Base) MCG/ACT inhaler, Inhale 2 puffs into the lungs every 6 (six) hours as needed for wheezing or shortness of breath., Disp: 1 each, Rfl: 2   cyclobenzaprine (FLEXERIL) 5 MG tablet, Take 1 tablet (5 mg total) by mouth at bedtime., Disp: 15 tablet, Rfl: 0   meloxicam (MOBIC) 15 MG tablet, Take 1 tablet (15 mg total) by mouth daily., Disp: 30 tablet, Rfl: 0   Prenat-Fe Poly-Methfol-FA-DHA (VITAFOL FE+) 90-0.6-0.4-200 MG CAPS, Take one Soft Gel capsule daily, Disp: 30 capsule, Rfl: 11   cabotegravir & rilpivirine ER (CABENUVA) 600 & 900 MG/3ML injection, INJECT 1 KIT MONTHLY FOR 1 MONTH AND THEN EVERY OTHER MONTH. (Patient not taking: Reported on 03/19/2021), Disp: 6 mL, Rfl: 5   escitalopram (LEXAPRO) 20 MG tablet, TAKE 1 TABLET BY MOUTH EVERY DAY (Patient not taking: Reported on 03/19/2021), Disp: 90 tablet, Rfl: 1   etonogestrel-ethinyl estradiol (NUVARING) 0.12-0.015 MG/24HR vaginal ring, Insert vaginally and leave in place for 3 consecutive weeks, then remove for 1 week. Take home pregnancy test in 1wk and if negative then can start the ring. (Patient not taking: Reported on 03/19/2021), Disp: 3 each, Rfl: 4   methocarbamol (ROBAXIN) 500 MG tablet, Take 1 tablet (500 mg total) by mouth 2 (two) times daily. (Patient not taking: Reported on 03/19/2021), Disp: 20 tablet, Rfl: 0   valACYclovir (VALTREX) 500 MG tablet, Take 1 tablet (500 mg total) by mouth 2 (two) times daily. (Patient not taking: Reported on 03/19/2021), Disp: 60  tablet, Rfl: 6   Objective:     Vitals:   03/19/21 1410  BP: 110/80  Pulse: 96  SpO2: 96%  Weight: 159 lb (72.1 kg)  Height: '4\' 10"'  (1.473 m)      Body mass index is 33.23 kg/m.    Physical Exam:    Gen: Appears well, nad, nontoxic and pleasant Neuro: Strength testing limited due to patient's tenderness.  Patient complaining of decreased sensation over upper arm proximal to the  elbow that does not fit any dermatomal or neurologic distribution.  Muscle tone wnl Skin: no suspicious lesion or defmority Psych: A&O, appropriate mood and affect  Right shoulder: no deformity, swelling or muscle wasting No scapular winging Patient not willing to do ROM due to tenderness TTP over the [place], clavicle, ac, coracoid, biceps groove, humerus, deltoid, trapezius, cervical spine Unable to perform self testing due to patient tenderness Negative Spurling's test bilat FROM of neck    Electronically signed by:  Erica Walton D.[name] 2:41 PM 03/19/21

## 2021-03-20 ENCOUNTER — Telehealth: Payer: Self-pay

## 2021-03-20 ENCOUNTER — Other Ambulatory Visit (HOSPITAL_COMMUNITY): Payer: Self-pay

## 2021-03-20 ENCOUNTER — Encounter: Payer: Self-pay | Admitting: Radiology

## 2021-03-20 NOTE — Telephone Encounter (Signed)
[facility] Patient Advocate Encounter  Patient's medication Renaldo Harrison) have been couriered to [facility] from [employer] and will be administered on patient next office visit on 03/27/21.  [name] , CPhT Specialty Pharmacy Patient [facility] for Infectious Disease Phone: [phone] Fax:  [phone]

## 2021-03-27 ENCOUNTER — Other Ambulatory Visit: Payer: Self-pay

## 2021-03-27 ENCOUNTER — Encounter: Payer: Self-pay | Admitting: Family

## 2021-03-27 ENCOUNTER — Ambulatory Visit (INDEPENDENT_AMBULATORY_CARE_PROVIDER_SITE_OTHER): Payer: Medicaid Other | Admitting: Family

## 2021-03-27 VITALS — BP 115/76 | HR 97 | Temp 98.2°F | Wt 160.0 lb

## 2021-03-27 DIAGNOSIS — Z23 Encounter for immunization: Secondary | ICD-10-CM | POA: Diagnosis not present

## 2021-03-27 DIAGNOSIS — B20 Human immunodeficiency virus [HIV] disease: Secondary | ICD-10-CM | POA: Diagnosis not present

## 2021-03-27 DIAGNOSIS — Z0000 Encounter for general adult medical examination without abnormal findings: Secondary | ICD-10-CM

## 2021-03-27 MED ORDER — TIVICAY 50 MG PO TABS: 50.0000 mg | ORAL_TABLET | Freq: Every day | ORAL | 5 refills | Status: DC

## 2021-03-27 MED ORDER — DESCOVY 200-25 MG PO TABS: 1.0000 | ORAL_TABLET | Freq: Every day | ORAL | 5 refills | Status: DC

## 2021-03-27 NOTE — Progress Notes (Signed)
Brief Narrative   Patient ID: Erica Walton, female    DOB: Dec 04, 1995, 25 y.o.   MRN: [id number]  Erica Walton is a 25 y.o. female with well controlled HIV, Dx 2013 with routine pregnancy testing. Has always been on treatment and previously in care through [facility].  CD4 nadir 300  HIV Risk: heterosexual  History of OIs: none known  Intake Labs 06/2019: Hep B sAg (-), sAb (+), cAb (-); Hep A (-), Hep C (-) Quantiferon (-) HLA B*5701 (-) G6PD: ()     Previous Regimens: Truvada + Prezista/Ritonovir Stribild Genvoya --> undetectable      Genotypes: 2013 - K103N  Subjective:    Chief Complaint  Patient presents with   Follow-up    B20    HPI:  Erica Walton is a 25 y.o. female with HIV disease last seen on 01/27/2021 with well-controlled virus and good tolerance to [place].  Viral load was undetectable with CD4 count of 990.  Here today for follow-up.  Erica Walton has decided after discussion with her husband and they would like to try to have another baby.  Requesting to come off of [place] and start a pregnancy safe regimen.  Overall feeling well today with no new concerns/complaints.  She is currently taking a prenatal vitamin. Denies fevers, chills, night sweats, headaches, changes in vision, neck pain/stiffness, nausea, diarrhea, vomiting, lesions or rashes.  Erica Walton has no problems obtaining medication from the pharmacy.  Denies feelings of being down, depressed, or hopeless recently.  She recently stopped taking her Lexapro and has weaned herself off.  Denies recreational or illicit drug use, tobacco use, or alcohol consumption.   Allergies  Allergen Reactions   Norco [Hydrocodone-Acetaminophen] Rash   Penicillins Rash and Other (See Comments)    Has patient had a PCN reaction causing immediate rash, facial/tongue/throat swelling, SOB or lightheadedness with hypotension: yes, [id number]} Has patient had a PCN reaction causing  severe rash involving mucus membranes or skin necrosis: no:[id number]} Has patient had a PCN reaction that required hospitalization no:[id number]} Has patient had a PCN reaction occurring within the last 10 years: no:[id number]} If all of the above answers are "NO", then may proceed with Cephalosporin use.       Outpatient Medications Prior to Visit  Medication Sig Dispense Refill   albuterol (VENTOLIN HFA) 108 (90 Base) MCG/ACT inhaler Inhale 2 puffs into the lungs every 6 (six) hours as needed for wheezing or shortness of breath. 1 each 2   cyclobenzaprine (FLEXERIL) 5 MG tablet Take 1 tablet (5 mg total) by mouth at bedtime. 15 tablet 0   meloxicam (MOBIC) 15 MG tablet Take 1 tablet (15 mg total) by mouth daily. 30 tablet 0   Prenat-Fe Poly-Methfol-FA-DHA (VITAFOL FE+) 90-0.6-0.4-200 MG CAPS Take one Soft Gel capsule daily 30 capsule 11   cabotegravir & rilpivirine ER (CABENUVA) 600 & 900 MG/3ML injection INJECT 1 KIT MONTHLY FOR 1 MONTH AND THEN EVERY OTHER MONTH. (Patient not taking: Reported on 03/19/2021) 6 mL 5   escitalopram (LEXAPRO) 20 MG tablet TAKE 1 TABLET BY MOUTH EVERY DAY (Patient not taking: Reported on 03/19/2021) 90 tablet 1   etonogestrel-ethinyl estradiol (NUVARING) 0.12-0.015 MG/24HR vaginal ring Insert vaginally and leave in place for 3 consecutive weeks, then remove for 1 week. Take home pregnancy test in 1wk and if negative then can start the ring. (Patient not taking: Reported on 03/19/2021) 3 each 4   methocarbamol (ROBAXIN) 500 MG tablet Take 1 tablet (500  mg total) by mouth 2 (two) times daily. (Patient not taking: Reported on 03/19/2021) 20 tablet 0   valACYclovir (VALTREX) 500 MG tablet Take 1 tablet (500 mg total) by mouth 2 (two) times daily. (Patient not taking: Reported on 03/19/2021) 60 tablet 6   No facility-administered medications prior to visit.     Past Medical History:  Diagnosis Date   Asthma    Genital herpes    HIV (human immunodeficiency virus  infection) ([place])      Past Surgical History:  Procedure Laterality Date   APPENDECTOMY     LAPAROSCOPIC APPENDECTOMY N/A 07/22/2019   Procedure: APPENDECTOMY LAPAROSCOPIC;  Surgeon: Herbert Pun, MD;  Location: [facility] ORS;  Service: General;  Laterality: N/A;      Review of Systems  Constitutional:  Negative for appetite change, chills, diaphoresis, fatigue, fever and unexpected weight change.  Eyes:        Negative for acute change in vision  Respiratory:  Negative for chest tightness, shortness of breath and wheezing.   Cardiovascular:  Negative for chest pain.  Gastrointestinal:  Negative for diarrhea, nausea and vomiting.  Genitourinary:  Negative for dysuria, pelvic pain and vaginal discharge.  Musculoskeletal:  Negative for neck pain and neck stiffness.  Skin:  Negative for rash.  Neurological:  Negative for seizures, syncope, weakness and headaches.  Hematological:  Negative for adenopathy. Does not bruise/bleed easily.  Psychiatric/Behavioral:  Negative for hallucinations.      Objective:    BP 115/76 (BP Location: Left Arm)   Pulse 97   Temp 98.2 F (36.8 C) (Oral)   Wt 160 lb (72.6 kg)   BMI 33.44 kg/m  Nursing note and vital signs reviewed.  Physical Exam Constitutional:      General: She is not in acute distress.    Appearance: She is well-developed.  Eyes:     Conjunctiva/sclera: Conjunctivae normal.  Cardiovascular:     Rate and Rhythm: Normal rate and regular rhythm.     Heart sounds: Normal heart sounds. No murmur heard.   No friction rub. No gallop.  Pulmonary:     Effort: Pulmonary effort is normal. No respiratory distress.     Breath sounds: Normal breath sounds. No wheezing or rales.  Chest:     Chest wall: No tenderness.  Abdominal:     General: Bowel sounds are normal.     Palpations: Abdomen is soft.     Tenderness: There is no abdominal tenderness.  Musculoskeletal:     Cervical back: Neck supple.  Lymphadenopathy:      Cervical: No cervical adenopathy.  Skin:    General: Skin is warm and dry.     Findings: No rash.  Neurological:     Mental Status: She is alert and oriented to person, place, and time.  Psychiatric:        Behavior: Behavior normal.        Thought Content: Thought content normal.        Judgment: Judgment normal.     Depression screen [facility] 2/9 03/27/2021 01/02/2021 09/23/2020 06/21/2020 05/10/2020  Decreased Interest 0 2 0 0 1  Down, Depressed, Hopeless 0 2 0 0 1  PHQ - 2 Score 0 4 0 0 2  Altered sleeping - 2 - - 3  Tired, decreased energy - 2 - - 3  Change in appetite - 1 - - 1  Feeling bad or failure about yourself  - 3 - - 0  Trouble concentrating - 1 - - 3  Moving slowly or fidgety/restless - 0 - - 0  Suicidal thoughts - 1 - - 3  PHQ-9 Score - 14 - - 15  Difficult doing work/chores - Extremely dIfficult - - -       Assessment & Plan:    Patient Active Problem List   Diagnosis Date Noted   Costochondritis 01/27/2021   Healthcare maintenance 01/27/2021   Suicidal thoughts 01/02/2021   Low back pain 09/23/2020   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 continues to have well-controlled virus with good adherence and tolerance to [place].  No signs/symptoms of opportunistic infection.  Given her desire for pregnancy we will discontinue [place] and start [place] and Descovy.  She is on a prenatal vitamin.  Briefly reviewed pregnancy and HIV.  Plan for follow-up in 3 months or sooner if needed with lab work on the same day.      Relevant Medications   dolutegravir (TIVICAY) 50 MG tablet   emtricitabine-tenofovir AF (DESCOVY) 200-25 MG tablet   Healthcare maintenance    Prevnar updated today.  Can receive  Pneumovax in 8 weeks. Discussed/recommended routine dental care.        I have discontinued [name]'s valACYclovir, cabotegravir & rilpivirine ER, methocarbamol, etonogestrel-ethinyl estradiol, and escitalopram. I am also having her start on Tivicay and Descovy. Additionally, I am having her maintain her albuterol, Vitafol FE+, cyclobenzaprine, and meloxicam.   Meds ordered this encounter  Medications   dolutegravir (TIVICAY) 50 MG tablet    Sig: Take 1 tablet (50 mg total) by mouth daily.    Dispense:  30 tablet    Refill:  5    Order Specific Question:   Supervising Provider    Answer:   Baxter Flattery, CYNTHIA [[id number]]   emtricitabine-tenofovir AF (DESCOVY) 200-25 MG tablet    Sig: Take 1 tablet by mouth daily.    Dispense:  30 tablet    Refill:  5    Order Specific Question:   Supervising Provider    Answer:   Carlyle Basques [[id number]]     Follow-up: Return in about 3 months (around 06/25/2021), or if symptoms worsen or fail to improve.   Terri Piedra, MSN, FNP-C Nurse Practitioner [facility] for Infectious Disease [place] number: [phone]

## 2021-03-27 NOTE — Assessment & Plan Note (Signed)
Ms. Erica Walton continues to have well-controlled virus with good adherence and tolerance to [place].  No signs/symptoms of opportunistic infection.  Given her desire for pregnancy we will discontinue [place] and start Tivicay and Descovy.  She is on a prenatal vitamin.  Briefly reviewed pregnancy and HIV.  Plan for follow-up in 3 months or sooner if needed with lab work on the same day.

## 2021-03-27 NOTE — Assessment & Plan Note (Signed)
   Prevnar updated today.  Can receive Pneumovax in 8 weeks.  Discussed/recommended routine dental care.

## 2021-03-27 NOTE — Patient Instructions (Addendum)
Nice to see you.   We will change your medications.  Continue to take pre-natal vitamins.  Plan for follow up with Rexene Alberts in 3 months or sooner if needed with lab work on the same day.  Let us know if you have any questions.   Have a great day and stay safe!

## 2021-03-27 NOTE — Addendum Note (Signed)
Addended by: Tressa Busman T on: 03/27/2021 03:58 PM   Modules accepted: Orders

## 2021-03-31 ENCOUNTER — Other Ambulatory Visit: Payer: Self-pay | Admitting: *Deleted

## 2021-03-31 MED ORDER — VITAFOL GUMMIES 3.33-0.333-34.8 MG PO CHEW: 1.0000 | CHEWABLE_TABLET | Freq: Every day | ORAL | 5 refills | Status: DC

## 2021-04-01 NOTE — Progress Notes (Signed)
Aleen Sells D.[facility] [address] [place] [address] Phone: [phone]   Assessment and Plan:     1. Acute pain of right shoulder -Acute, resolved - Resolved right shoulder plan after completion of 2 weeks of meloxicam and 10 days of Flexeril - Continue meloxicam for additional 3 days and then stop.  May use remainder as needed for breakthrough pain - Stop using Flexeril - Continue HEP to prevent recurrence - Cleared to return to full work activities.  Work note provided   Pertinent previous records reviewed include none   Follow Up: As needed if no improvement or worsening of symptoms   Subjective:    I, Moenique Parris, am serving as a Neurosurgeon for Doctor Richardean Sale  Chief Complaint: right shoulder pain   HPI:   03/19/21 Patient is a 25 year old female presenting with R shoulder pain that started a few days ago. Patient was trying to roll over in bed with child in her arms. Patient has pain with any movement, denies numbness and tingling. Patient was seen in the ED 03/15/21 and referred to sports medicine. Today patient states pain is on the top and into the neck,shoulder is very stiff and sore was given ROM exercises from ED but still feels pulling and pain with any movement. Patient is currently in a sling.  Patient has a 3-year-old child.  States she is currently starting her menstrual cycle.   Radiates: no Mechanical symptoms:feels popping Weakness:no Aggravates: all movements Treatments tried: Tylenol , ice and heat    04/02/2021 Patient states that her shoulder is good doesn't have any issues. Has a tinge every once in a while is able to lift her toddlers with no pain. Stopped taking flexeril but did finish meloxicam.    Relevant Historical Information: Rotator cuff injury R shoulder.  History HIV.  Additional pertinent review of systems negative.   Current Outpatient Medications:    albuterol (VENTOLIN  HFA) 108 (90 Base) MCG/ACT inhaler, Inhale 2 puffs into the lungs every 6 (six) hours as needed for wheezing or shortness of breath., Disp: 1 each, Rfl: 2   cyclobenzaprine (FLEXERIL) 5 MG tablet, Take 1 tablet (5 mg total) by mouth at bedtime., Disp: 15 tablet, Rfl: 0   dolutegravir (TIVICAY) 50 MG tablet, Take 1 tablet (50 mg total) by mouth daily., Disp: 30 tablet, Rfl: 5   emtricitabine-tenofovir AF (DESCOVY) 200-25 MG tablet, Take 1 tablet by mouth daily., Disp: 30 tablet, Rfl: 5   meloxicam (MOBIC) 15 MG tablet, Take 1 tablet (15 mg total) by mouth daily., Disp: 30 tablet, Rfl: 0   Prenat-Fe Poly-Methfol-FA-DHA (VITAFOL FE+) 90-0.6-0.4-200 MG CAPS, Take one Soft Gel capsule daily, Disp: 30 capsule, Rfl: 11   Prenatal Vit-Fe Phos-FA-Omega (VITAFOL GUMMIES) 3.33-0.333-34.8 MG CHEW, Chew 3 tablets by mouth daily., Disp: 90 tablet, Rfl: 5   Objective:     Vitals:   04/02/21 1527  BP: 124/80  Pulse: 98  SpO2: 98%  Weight: 161 lb (73 kg)  Height: 4\' 10"  (1.473 m)      Body mass index is 33.65 kg/m.    Physical Exam:    Gen: Appears well, nad, nontoxic and pleasant Neuro:sensation intact, strength is 5/5 with df/pf/inv/ev, muscle tone wnl Skin: no suspicious lesion or defmority Psych: A&O, appropriate mood and affect  Right shoulder: no deformity, swelling or muscle wasting No scapular winging FF 180, abd 180, int 0, ext 90 NTTP over the [place], clavicle, ac, coracoid,  biceps groove, humerus, deltoid, trapezius, cervical spine Neg neer, hawkings, empty can, subscap liftoff, speeds, obriens, crossarm Neg ant drawer, sulcus sign, apprehension Negative Spurling's test bilat FROM of neck    Electronically signed by:  Aleen Sells [facility] 3:39 PM 04/02/21

## 2021-04-02 ENCOUNTER — Other Ambulatory Visit: Payer: Self-pay

## 2021-04-02 ENCOUNTER — Ambulatory Visit: Payer: Medicaid Other | Admitting: Sports Medicine

## 2021-04-02 ENCOUNTER — Other Ambulatory Visit: Payer: Self-pay | Admitting: *Deleted

## 2021-04-02 VITALS — BP 124/80 | HR 98 | Ht <= 58 in | Wt 161.0 lb

## 2021-04-02 DIAGNOSIS — M25511 Pain in right shoulder: Secondary | ICD-10-CM

## 2021-04-02 MED ORDER — VITAFOL GUMMIES 3.33-0.333-34.8 MG PO CHEW: 3.0000 | CHEWABLE_TABLET | Freq: Every day | ORAL | 5 refills | Status: DC

## 2021-04-02 NOTE — Patient Instructions (Addendum)
Good to see you  Follow up as needed  

## 2021-04-28 ENCOUNTER — Encounter: Payer: Self-pay | Admitting: Obstetrics & Gynecology

## 2021-05-03 ENCOUNTER — Encounter: Payer: Self-pay | Admitting: Obstetrics & Gynecology

## 2021-05-06 ENCOUNTER — Other Ambulatory Visit (HOSPITAL_COMMUNITY): Payer: Self-pay

## 2021-05-18 ENCOUNTER — Emergency Department (HOSPITAL_COMMUNITY)
Admission: EM | Admit: 2021-05-18 | Discharge: 2021-05-18 | Disposition: A | Discharge status: Home / Self Care | Payer: Medicaid Other | Attending: Emergency Medicine | Admitting: Emergency Medicine

## 2021-05-18 ENCOUNTER — Emergency Department (HOSPITAL_COMMUNITY): Payer: Medicaid Other

## 2021-05-18 ENCOUNTER — Encounter (HOSPITAL_COMMUNITY): Payer: Self-pay | Admitting: Emergency Medicine

## 2021-05-18 ENCOUNTER — Other Ambulatory Visit: Payer: Self-pay

## 2021-05-18 DIAGNOSIS — Z91040 Latex allergy status: Secondary | ICD-10-CM | POA: Diagnosis not present

## 2021-05-18 DIAGNOSIS — W19XXXA Unspecified fall, initial encounter: Secondary | ICD-10-CM

## 2021-05-18 DIAGNOSIS — S3992XA Unspecified injury of lower back, initial encounter: Secondary | ICD-10-CM | POA: Diagnosis present

## 2021-05-18 DIAGNOSIS — M545 Low back pain, unspecified: Secondary | ICD-10-CM | POA: Diagnosis not present

## 2021-05-18 DIAGNOSIS — Y92521 Bus station as the place of occurrence of the external cause: Secondary | ICD-10-CM | POA: Diagnosis not present

## 2021-05-18 DIAGNOSIS — S39012A Strain of muscle, fascia and tendon of lower back, initial encounter: Secondary | ICD-10-CM | POA: Diagnosis not present

## 2021-05-18 DIAGNOSIS — W01198A Fall on same level from slipping, tripping and stumbling with subsequent striking against other object, initial encounter: Secondary | ICD-10-CM | POA: Insufficient documentation

## 2021-05-18 LAB — PREGNANCY, URINE: Preg Test, Ur: NEGATIVE

## 2021-05-18 MED ORDER — LIDOCAINE 5 % EX PTCH
1.0000 | MEDICATED_PATCH | CUTANEOUS | Status: DC
  Administered 2021-05-18: 1 via TRANSDERMAL
  Filled 2021-05-18: qty 1

## 2021-05-18 MED ORDER — IBUPROFEN 400 MG PO TABS
600.0000 mg | ORAL_TABLET | Freq: Once | ORAL | Status: AC
  Administered 2021-05-18: 600 mg via ORAL
  Filled 2021-05-18: qty 1

## 2021-05-18 MED ORDER — METHOCARBAMOL 500 MG PO TABS: 500.0000 mg | ORAL_TABLET | Freq: Two times a day (BID) | ORAL | 0 refills | Status: DC

## 2021-05-18 MED ORDER — DICLOFENAC SODIUM 50 MG PO TBEC: 50.0000 mg | DELAYED_RELEASE_TABLET | Freq: Two times a day (BID) | ORAL | 0 refills | Status: AC

## 2021-05-18 MED ORDER — LIDOCAINE 5 % EX PTCH: 1.0000 | MEDICATED_PATCH | CUTANEOUS | 0 refills | Status: DC

## 2021-05-18 NOTE — ED Notes (Signed)
Transported pt to restroom in wheelchair.  

## 2021-05-18 NOTE — ED Triage Notes (Signed)
Pt slipped on ice on Wednesday while taking her kids to the bus stop.  C/o pain to lower back that radiates to bilateral hips.

## 2021-05-18 NOTE — Discharge Instructions (Signed)
Take Robaxin and diclofenac as needed as prescribed for back pain.  Can apply Lidoderm patch as prescribed. Recommend warm compresses for 20 minutes at a time followed by gentle stretching. Follow-up with your primary care provider if pain persists.

## 2021-05-18 NOTE — ED Provider Notes (Signed)
[facility] EMERGENCY DEPARTMENT Provider Note   CSN: [id number] Arrival date & time: 05/18/21  1131     History  Chief Complaint  Patient presents with   Back Pain    Erica Walton is a 26 y.o. female.  26 year old female presents with complaint of pain in her back after fall x2.  Patient states that last week she was walking out to the bus stop with her kids when she slipped on ice and fell on her back.  Patient's neighbor helped her stand and she was ambulatory after that fall.  Patient then states the following day she slipped on her steps and fell hitting her back again.  Patient tried taking Flexeril without improvement in her pain.  Pain is worse with any movement, sitting, walking.      Home Medications Prior to Admission medications   Medication Sig Start Date End Date Taking? Authorizing Provider  albuterol (VENTOLIN HFA) 108 (90 Base) MCG/ACT inhaler Inhale 2 puffs into the lungs every 6 (six) hours as needed for wheezing or shortness of breath. 09/23/20  Yes Blanchard Kelch, NP  diclofenac (VOLTAREN) 50 MG EC tablet Take 1 tablet (50 mg total) by mouth 2 (two) times daily for 10 days. 05/18/21 05/28/21 Yes Jeannie Fend, PA-C  dolutegravir (TIVICAY) 50 MG tablet Take 1 tablet (50 mg total) by mouth daily. Patient taking differently: Take 50 mg by mouth every evening. 03/27/21  Yes Veryl Speak, FNP  emtricitabine-tenofovir AF (DESCOVY) 200-25 MG tablet Take 1 tablet by mouth daily. Patient taking differently: Take 1 tablet by mouth every evening. 03/27/21  Yes Veryl Speak, FNP  lidocaine (LIDODERM) 5 % Place 1 patch onto the skin daily. Remove & Discard patch within 12 hours or as directed by MD 05/18/21  Yes Jeannie Fend, PA-C  methocarbamol (ROBAXIN) 500 MG tablet Take 1 tablet (500 mg total) by mouth 2 (two) times daily. 05/18/21  Yes Jeannie Fend, PA-C  Prenatal Vit-Fe Phos-FA-Omega (VITAFOL GUMMIES) 3.33-0.333-34.8 MG CHEW Chew 3  tablets by mouth daily. Patient taking differently: Chew 1 tablet by mouth daily. 04/02/21  Yes Anyanwu, Jethro Bastos, MD      Allergies    Latex, Norco [hydrocodone-acetaminophen], and Penicillins    Review of Systems   Review of Systems  Constitutional:  Negative for fever.  Gastrointestinal:  Negative for abdominal pain, constipation and diarrhea.  Genitourinary:  Negative for decreased urine volume.  Musculoskeletal:  Positive for back pain. Negative for gait problem, joint swelling, neck pain and neck stiffness.  Skin:  Negative for rash and wound.  Allergic/Immunologic: Negative for immunocompromised state.  Neurological:  Negative for weakness and numbness.  Hematological:  Negative for adenopathy.   Physical Exam Updated Vital Signs BP 100/66    Pulse 88    Temp 98 F (36.7 C) (Oral)    Resp 12    Ht 4\' 10"  (1.473 m)    Wt 73.5 kg    LMP 05/01/2021    SpO2 99%    BMI 33.86 kg/m  Physical Exam Vitals and nursing note reviewed.  Constitutional:      General: She is not in acute distress.    Appearance: She is well-developed. She is not diaphoretic.  HENT:     Head: Normocephalic and atraumatic.  Cardiovascular:     Pulses: Normal pulses.  Pulmonary:     Effort: Pulmonary effort is normal.  Abdominal:     Palpations: Abdomen is soft.  Tenderness: There is no abdominal tenderness.  Musculoskeletal:        General: Tenderness present. No swelling or deformity.     Cervical back: No tenderness or bony tenderness.     Thoracic back: No tenderness or bony tenderness.     Lumbar back: Tenderness and bony tenderness present.       Back:     Comments: Tenderness to low back without crepitus or step-off.  Skin:    General: Skin is warm and dry.     Findings: No bruising, erythema or rash.  Neurological:     Mental Status: She is alert and oriented to person, place, and time.     Sensory: No sensory deficit.     Motor: No weakness.     Gait: Gait normal.  Psychiatric:         Behavior: Behavior normal.    ED Results / Procedures / Treatments   Labs (all labs ordered are listed, but only abnormal results are displayed) Labs Reviewed  PREGNANCY, URINE    EKG None  Radiology DG Lumbar Spine Complete  Result Date: 05/18/2021 CLINICAL DATA:  Low back pain after fall. EXAM: LUMBAR SPINE - COMPLETE 4+ VIEW COMPARISON:  October 06, 2020. FINDINGS: There is no evidence of lumbar spine fracture. Alignment is normal. Intervertebral disc spaces are maintained. IMPRESSION: Negative. Electronically Signed   By: Lupita Raider M.D.   On: 05/18/2021 14:18    Procedures Procedures    Medications Ordered in ED Medications  lidocaine (LIDODERM) 5 % 1 patch (1 patch Transdermal Patch Applied 05/18/21 1426)  ibuprofen (ADVIL) tablet 600 mg (600 mg Oral Given 05/18/21 1426)    ED Course/ Medical Decision Making/ A&P                           Medical Decision Making Amount and/or Complexity of Data Reviewed Labs: ordered. Radiology: ordered.  Risk Prescription drug management.   26 year old female with complaint of low back pain after fall x2 as above.  Found to have lower back tenderness without crepitus or step-off.  Gait intact, sensation and lower extremity strength normal.  Urine pregnancy negative.  X-ray lumbar spine series without acute injury.  Patient is given a Lidoderm patch and Motrin while in the ER.  Discharged with Lidoderm patch, diclofenac, Robaxin.  Recommend warm compresses to low back followed by gentle stretching.  Recheck with PCP.        Final Clinical Impression(s) / ED Diagnoses Final diagnoses:  Fall, initial encounter  Strain of lumbar region, initial encounter    Rx / DC Orders ED Discharge Orders          Ordered    lidocaine (LIDODERM) 5 %  Every 24 hours        05/18/21 1426    diclofenac (VOLTAREN) 50 MG EC tablet  2 times daily        05/18/21 1426    methocarbamol (ROBAXIN) 500 MG tablet  2 times daily         05/18/21 1426              Jeannie Fend, PA-C 05/18/21 1442    Linwood Dibbles, MD 05/19/21 [phone]

## 2021-05-20 ENCOUNTER — Telehealth: Payer: Self-pay

## 2021-05-20 DIAGNOSIS — Z9189 Other specified personal risk factors, not elsewhere classified: Secondary | ICD-10-CM

## 2021-05-20 NOTE — Telephone Encounter (Signed)
Transition Care Management Follow-up Telephone Call Date of discharge and from where: 05/19/2021-[facility]  How have you been since you were released from the hospital? Pt stated she is doing fine.  Any questions or concerns? No  Items Reviewed: Did the pt receive and understand the discharge instructions provided? Yes  Medications obtained and verified? Yes  Other? No  Any new allergies since your discharge? No  Dietary orders reviewed? No Do you have support at home? Yes   Home Care and Equipment/Supplies: Were home health services ordered? not applicable If so, what is the name of the agency? N/A  Has the agency set up a time to come to the patient's home? not applicable Were any new equipment or medical supplies ordered?  No What is the name of the medical supply agency? N/A Were you able to get the supplies/equipment? not applicable Do you have any questions related to the use of the equipment or supplies? No  Functional Questionnaire: (I = Independent and D = Dependent) ADLs: I  Bathing/Dressing- I  Meal Prep- I  Eating- I  Maintaining continence- I  Transferring/Ambulation- I  Managing Meds- I  Follow up appointments reviewed:  PCP Hospital f/u appt confirmed? No   Specialist Hospital f/u appt confirmed? No   Are transportation arrangements needed? No  If their condition worsens, is the pt aware to call PCP or go to the Emergency Dept.? Yes Was the patient provided with contact information for the PCP's office or ED? Yes Was to pt encouraged to call back with questions or concerns? Yes

## 2021-05-20 NOTE — Addendum Note (Signed)
Addended by: Victorio Palm on: 05/20/2021 09:19 AM   Modules accepted: Orders

## 2021-05-21 ENCOUNTER — Telehealth: Payer: Self-pay

## 2021-05-21 NOTE — Telephone Encounter (Signed)
° °  Telephone encounter was:  Unsuccessful.  05/21/2021 Name: Erica Walton MRN: [id number] DOB: June 08, 1995  Unsuccessful outbound call made today to assist with:   PCP  Outreach Attempt:  1st Attempt  A HIPAA compliant voice message was left requesting a return call.  Instructed patient to call back at earliest convenience.    [name] Care Guide, Embedded Care Coordination [facility], Care Management  [phone] [address] [place], [place], [place] [address] Phone: [phone] Email: [email or web address]

## 2021-05-23 ENCOUNTER — Telehealth: Payer: Self-pay

## 2021-05-23 NOTE — Telephone Encounter (Signed)
° °  Telephone encounter was:  Unsuccessful.  05/23/2021 Name: Erica Walton MRN: [id number] DOB: July 21, 1995  Unsuccessful outbound call made today to assist with:   pcp  Outreach Attempt:  2nd Attempt  A HIPAA compliant voice message was left requesting a return call.  Instructed patient to call back at  earliest convenience.    [facility], Care Management  [phone] [address], [place], [place] [address] Phone: [phone] Email: [email or web address]

## 2021-05-26 ENCOUNTER — Telehealth: Payer: Self-pay

## 2021-05-26 NOTE — Telephone Encounter (Signed)
° °  Telephone encounter was:  Successful.  05/26/2021 Name: Erica Walton MRN: [id number] DOB: [birth year]  [name] is a 26 y.o. year old female who is a primary care patient of Pcp, No . The community resource team was consulted for assistance with  pcp  Care guide performed the following interventions: Patient provided with information about care guide support team and interviewed to confirm resource needs.Patient stated  she found a pcp and has an appointment set up already  Follow Up Plan:  No further follow up planned at this time. The patient has been provided with needed resources.    [name] Care Guide, Embedded Care Coordination [facility], Care Management  [phone] [address] [place], [place], [place] [address] Phone: [phone] Email: [email or web address]

## 2021-06-03 ENCOUNTER — Ambulatory Visit: Payer: Medicaid Other | Admitting: Family Medicine

## 2021-06-03 ENCOUNTER — Other Ambulatory Visit: Payer: Self-pay

## 2021-06-03 ENCOUNTER — Encounter: Payer: Self-pay | Admitting: Family Medicine

## 2021-06-03 VITALS — BP 118/80 | Ht <= 58 in | Wt 157.4 lb

## 2021-06-03 DIAGNOSIS — N393 Stress incontinence (female) (male): Secondary | ICD-10-CM

## 2021-06-03 DIAGNOSIS — N819 Female genital prolapse, unspecified: Secondary | ICD-10-CM

## 2021-06-03 NOTE — Progress Notes (Signed)
° °  GYNECOLOGY PROBLEM  VISIT ENCOUNTER NOTE  Subjective:   Erica Walton is a 26 y.o. [id number] female here for a problem GYN visit.  Current complaints: Reports bulging into vagina from bladder and rectum. Reports leaking with cough/sneezing and sometimes just movement.   Denies abnormal vaginal bleeding, discharge, pelvic pain, problems with intercourse or other gynecologic concerns.    Gynecologic History Patient's last menstrual period was 06/01/2021.  Contraception: none- desires pregnancy this year. Trying for a boy  There are no preventive care reminders to display for this patient.  The following portions of the patient's history were reviewed and updated as appropriate: allergies, current medications, past family history, past medical history, past social history, past surgical history and problem list.  Review of Systems Pertinent items are noted in HPI.   Objective:  BP 118/80    Ht 4\' 10"  (1.473 m)    Wt 157 lb 6.4 oz (71.4 kg)    LMP 06/01/2021    BMI 32.90 kg/m  Gen: well appearing, NAD HEENT: no scleral icterus CV: RR Lung: Normal WOB Ext: warm well perfused  PELVIC: Normal appearing external genitalia; normal appearing vaginal mucosa and cervix.  No abnormal discharge noted.   Normal uterine size, no other palpable masses, no uterine or adnexal tenderness. Cervix moves to mid vagina with valsalva. + cystoceole anteriorly.  Minimal contraction of bulbous muscles. No prolapse of introitus with valsalva.     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  Normalized that after 5 pregnancies and relatively close spacing weakness is expected. - Ambulatory referral to Urogynecology - Ambulatory referral to Physical Therapy  2. Stress incontinence of urine - Ambulatory referral to Urogynecology - Ambulatory referral to Physical Therapy  Please refer to After Visit Summary for other  counseling recommendations.   Return if symptoms worsen or fail to improve.  07/30/2021, MD, MPH, ABFM Attending Physician Faculty Practice- [facility]

## 2021-06-16 ENCOUNTER — Other Ambulatory Visit: Payer: Self-pay

## 2021-06-16 ENCOUNTER — Ambulatory Visit (HOSPITAL_BASED_OUTPATIENT_CLINIC_OR_DEPARTMENT_OTHER): Payer: Medicaid Other | Admitting: Nurse Practitioner

## 2021-06-16 ENCOUNTER — Encounter (HOSPITAL_BASED_OUTPATIENT_CLINIC_OR_DEPARTMENT_OTHER): Payer: Self-pay | Admitting: Nurse Practitioner

## 2021-06-16 VITALS — BP 117/72 | HR 106 | Ht <= 58 in | Wt 158.0 lb

## 2021-06-16 DIAGNOSIS — Z0000 Encounter for general adult medical examination without abnormal findings: Secondary | ICD-10-CM | POA: Diagnosis not present

## 2021-06-16 DIAGNOSIS — Z13228 Encounter for screening for other metabolic disorders: Secondary | ICD-10-CM

## 2021-06-16 DIAGNOSIS — G8929 Other chronic pain: Secondary | ICD-10-CM

## 2021-06-16 DIAGNOSIS — B372 Candidiasis of skin and nail: Secondary | ICD-10-CM | POA: Insufficient documentation

## 2021-06-16 DIAGNOSIS — Z1329 Encounter for screening for other suspected endocrine disorder: Secondary | ICD-10-CM

## 2021-06-16 DIAGNOSIS — L301 Dyshidrosis [pompholyx]: Secondary | ICD-10-CM | POA: Insufficient documentation

## 2021-06-16 DIAGNOSIS — B20 Human immunodeficiency virus [HIV] disease: Secondary | ICD-10-CM

## 2021-06-16 DIAGNOSIS — Z30015 Encounter for initial prescription of vaginal ring hormonal contraceptive: Secondary | ICD-10-CM

## 2021-06-16 DIAGNOSIS — M545 Low back pain, unspecified: Secondary | ICD-10-CM

## 2021-06-16 DIAGNOSIS — Z130 Encounter for screening for diseases of the blood and blood-forming organs and certain disorders involving the immune mechanism: Secondary | ICD-10-CM | POA: Diagnosis not present

## 2021-06-16 DIAGNOSIS — Z21 Asymptomatic human immunodeficiency virus [HIV] infection status: Secondary | ICD-10-CM

## 2021-06-16 DIAGNOSIS — Z1321 Encounter for screening for nutritional disorder: Secondary | ICD-10-CM

## 2021-06-16 DIAGNOSIS — N3090 Cystitis, unspecified without hematuria: Secondary | ICD-10-CM | POA: Insufficient documentation

## 2021-06-16 HISTORY — DX: Dyshidrosis (pompholyx): L30.1

## 2021-06-16 HISTORY — DX: Asymptomatic human immunodeficiency virus (hiv) infection status: Z21

## 2021-06-16 MED ORDER — MOMETASONE FUROATE 0.1 % EX CREA: TOPICAL_CREAM | CUTANEOUS | 1 refills | Status: DC

## 2021-06-16 MED ORDER — ETONOGESTREL-ETHINYL ESTRADIOL 0.12-0.015 MG/24HR VA RING: VAGINAL_RING | VAGINAL | 3 refills | Status: DC

## 2021-06-16 MED ORDER — NITROFURANTOIN MONOHYD MACRO 100 MG PO CAPS: 100.0000 mg | ORAL_CAPSULE | Freq: Two times a day (BID) | ORAL | 0 refills | Status: DC

## 2021-06-16 MED ORDER — FLUCONAZOLE 150 MG PO TABS: ORAL_TABLET | ORAL | 2 refills | Status: DC

## 2021-06-16 NOTE — Assessment & Plan Note (Signed)
Chronic. Will obtain x-ray evaluation today for monitoring.  Recommend stretching exercises. She is unable to commit to PT at this time. Will await results to determine further steps.  No alarm sx present.

## 2021-06-16 NOTE — Progress Notes (Signed)
[name], DNP, AGNP-c Primary Care & Sports Medicine [address]   [place], [place] [address] [phone]  New patient visit   Patient: Erica Walton   DOB: 1996-02-24   26 y.o. Female  MRN: [id number] Visit Date: 06/16/2021  Patient Care Team: Romano Stigger, Coralee Pesa, NP as PCP - General (Nurse Practitioner)  Today's healthcare provider: [name], NP   Chief Complaint  Patient presents with   New Patient (Initial Visit)    Patient presents today to establish care. She would like to discuss rash on hands and belly and chronic back pain x 15 years. No refills needed today.   Subjective    Erica Walton is a 26 y.o. female who presents today as a new patient to establish care.    Patient endorses the following concerns presently: Back Pain Zarrah endorses chronic back pain that has been present for about 15 years. She has a history of mild scoliosis and is unsure if this is affecting her back at this time or not. She has had multiple pregnancies with 5 children born to her in the last 60 years. She reports that her back pain prevents her from lifting as she would like. She endorses using muscle relaxants in the past with little success. She denies paresthesia's, saddle symptoms.  HIV Erica Walton endorses she has been HIV positive for many years. She is currently managed with ID and is undetectable status. She denies any concerning infections, weight loss, fevers, chills, or other symptoms. She reports she feels well overall. She is taking her medication daily without any side effects. Her regimen was recently changed as she was planning to have another baby, but she tells me those plans are on hold for now.   Contraception Erica Walton and her spouse were planning to have another baby in the near future, but she reports they have had to make some changes due to financial setbacks. She would like a pregnancy test today to ensure she is not  pregnant. She would also like to start back on birth control if she is not currently pregnant. She tells me that she recently stopped her medication for depression to plan for pregnancy.   Rash Erica Walton endorses a rash on her abdomen that has been present for about a week or two. She endorses a red, itchy rash with small bumps. She also endorses a rash on her fingers with small blister-like bumps that itches. She has not tried anything for this. She denies fever, chills, or drainage. She thinks it may be an allergy.   History reviewed and reveals the following: Past Medical History:  Diagnosis Date   Asthma    Genital herpes    HIV (human immunodeficiency virus infection) ([place])    HIV positive ([place]) 06/16/2021   Past Surgical History:  Procedure Laterality Date   APPENDECTOMY     LAPAROSCOPIC APPENDECTOMY N/A 07/22/2019   Procedure: APPENDECTOMY LAPAROSCOPIC;  Surgeon: Herbert Pun, MD;  Location: [facility] ORS;  Service: General;  Laterality: N/A;   Family Status  Relation Name Status   Father  (Not Specified)   Other Maternal Great Aunt Alive   Family History  Problem Relation Age of Onset   Cancer Father    Breast cancer Other    Social History   Socioeconomic History   Marital status: Married    Spouse name: Tanja Port    Number of children: 4   Years of education: Not on file  Highest education level: Not on file  Occupational History   Not on file  Tobacco Use   Smoking status: Never   Smokeless tobacco: Never  Substance and Sexual Activity   Alcohol use: Not Currently    Comment: 2018   Drug use: Never   Sexual activity: Yes    Birth control/protection: None    Comment: [place]CL[place]ED [place]DOM[place]  Other Topics Concern   Not on file  Social History Narrative   Not on file   Social Determinants of Health   Financial Resource Strain: Not on file  Food Insecurity: Not on file  Transportation Needs: Not on file  Physical Activity: Not on file  Stress: Not on  file  Social Connections: Not on file   Outpatient Medications Prior to Visit  Medication Sig   albuterol (VENTOLIN HFA) 108 (90 Base) MCG/ACT inhaler Inhale 2 puffs into the lungs every 6 (six) hours as needed for wheezing or shortness of breath.   dolutegravir (TIVICAY) 50 MG tablet Take 1 tablet (50 mg total) by mouth daily. (Patient taking differently: Take 50 mg by mouth every evening.)   emtricitabine-tenofovir AF (DESCOVY) 200-25 MG tablet Take 1 tablet by mouth daily. (Patient taking differently: Take 1 tablet by mouth every evening.)   Prenatal Vit-Fe Phos-FA-Omega (VITAFOL GUMMIES) 3.33-0.333-34.8 MG CHEW Chew 3 tablets by mouth daily. (Patient taking differently: Chew 1 tablet by mouth daily.)   No facility-administered medications prior to visit.   Allergies  Allergen Reactions   Latex Rash   Norco [Hydrocodone-Acetaminophen] Rash   Penicillins Rash and Other (See Comments)    Has patient had a PCN reaction causing immediate rash, facial/tongue/throat swelling, SOB or lightheadedness with hypotension: yes, [id number]} Has patient had a PCN reaction causing severe rash involving mucus membranes or skin necrosis: no:[id number]} Has patient had a PCN reaction that required hospitalization no:[id number]} Has patient had a PCN reaction occurring within the last 10 years: no:[id number]} If all of the above answers are "NO", then may proceed with Cephalosporin use.    Immunization History  Administered Date(s) Administered   DTaP 12/10/1997, 01/30/2000, 06/22/2001, 01/17/2002   HPV Quadrivalent 12/24/2006, 05/28/2010   Hepatitis A, Ped/Adol-2 Dose 05/28/2010   Hepatitis B 06/22/2001, 01/17/2002, 08/02/2003   HiB (PRP-T) 12/10/1997   IPV 12/10/1997, 01/30/2000, 06/22/2001, 01/17/2002   Influenza, Seasonal, Injecte, Preservative Fre 01/12/2012   Influenza,inj,Quad PF,6+ Mos 02/02/2013, 02/25/2017, 04/01/2018, 01/03/2020, 01/27/2021   Influenza-Unspecified 02/25/2017   MMR  01/30/2000, 06/22/2001, 06/01/2013, 11/06/2018   Meningococcal Conjugate 12/24/2006   Pneumococcal Conjugate-13 03/27/2021   Tdap 12/24/2006, 10/30/2011, 03/29/2012, 03/20/2013, 09/30/2017, 11/06/2018, 01/24/2020   Varicella 01/17/2002, 05/28/2010    Health Maintenance Due: Health Maintenance  Topic Date Due   COVID-19 Vaccine (1) 06/19/2021 (Originally 08/07/1996)   PAP-Cervical Cytology Screening  05/17/2023   PAP SMEAR-Modifier  05/17/2023   TETANUS/TDAP  01/23/2030   INFLUENZA VACCINE  Completed   HPV VACCINES  Completed   Hepatitis C Screening  Completed   HIV Screening  Completed    Review of Systems All review of systems negative except what is listed in the HPI   Objective    BP 117/72    Pulse (!) 106    Ht _0  (1.448 m)    Wt 158 lb (71.7 kg)    LMP 06/01/2021    SpO2 98%    BMI 34.19 kg/m  Physical Exam Vitals and nursing note reviewed.  Constitutional:      General: She is not in acute distress.  Appearance: Normal appearance.  Eyes:     Extraocular Movements: Extraocular movements intact.     Conjunctiva/sclera: Conjunctivae normal.     Pupils: Pupils are equal, round, and reactive to light.  Neck:     Vascular: No carotid bruit.  Cardiovascular:     Rate and Rhythm: Normal rate and regular rhythm.     Pulses: Normal pulses.     Heart sounds: Normal heart sounds. No murmur heard. Pulmonary:     Effort: Pulmonary effort is normal.     Breath sounds: Normal breath sounds. No wheezing.  Abdominal:     General: Bowel sounds are normal.     Palpations: Abdomen is soft.  Musculoskeletal:        General: Tenderness present. Normal range of motion.     Cervical back: Normal range of motion.     Right lower leg: No edema.     Left lower leg: No edema.     Comments: Tenderness to lumbar spine. No injury identified.   Skin:    General: Skin is warm and dry.     Capillary Refill: Capillary refill takes less than 2 seconds.  Neurological:     General: No  focal deficit present.     Mental Status: She is alert and oriented to person, place, and time.  Psychiatric:        Mood and Affect: Mood normal.        Behavior: Behavior normal.        Thought Content: Thought content normal.        Judgment: Judgment normal.    No results found for any visits on 06/16/21.  Assessment & Plan      Problem List Items Addressed This Visit     Human immunodeficiency virus (HIV) disease ([place])    Chronic. Asymptomatic. Recent tests undetectable. UPT negative today.  Managing well with current regimen. Followed by ID.  No alarm symptoms present today. Will obtain basic labs for evaluation.  Single monogamous partner relationship.  No concerns. Will continue to collaborate with ID for future needs.       Relevant Medications   nitrofurantoin, macrocrystal-monohydrate, (MACROBID) 100 MG capsule   fluconazole (DIFLUCAN) 150 MG tablet   Low back pain - Primary    Chronic. Will obtain x-ray evaluation today for monitoring.  Recommend stretching exercises. She is unable to commit to PT at this time. Will await results to determine further steps.  No alarm sx present.       Relevant Orders   DG Lumbar Spine 2-3 Views   Encounter for initial prescription of vaginal ring hormonal contraceptive    UPT negative today. Recommend restart Nuva Ring for contraceptive management.  Patient has tolerated this well in the past.  Recommend f/u if she plans pregnancy in the future for discussion on recommendations with medications, supplements, and overall health.       Relevant Medications   etonogestrel-ethinyl estradiol (NUVARING) 0.12-0.015 MG/24HR vaginal ring   Candidal intertrigo    Erythematous macular rash on abdomen at abdominal fold with purpuric discoloration consistent with candidal infection. No drainage present.  Will treat with oral fluconazole and topical mometasone cream.  F/U if sx worsen or fail to improve.       Relevant Medications    nitrofurantoin, macrocrystal-monohydrate, (MACROBID) 100 MG capsule   fluconazole (DIFLUCAN) 150 MG tablet   Dyshidrotic eczema    Flesh colored clear pustules on palmar surface of pointer finger and middle finger of right hand  and thumb of left hand with no erythema, edema, warmth, or drainage present.  Will treat with mometasone cream topically for 7 days.  F/U if sx worsen or fail to improve.  Allergen unknown.       Relevant Medications   mometasone (ELOCON) 0.1 % cream   Cystitis    UA positive on evaluation today. UPT negative.  Will treat with nitrofurantoin for 5 days. F/U if symptoms worsen or fail to improve.  Suspect this could be contributing to back pain.       Relevant Medications   nitrofurantoin, macrocrystal-monohydrate, (MACROBID) 100 MG capsule   RESOLVED: HIV positive ([facility])   Relevant Medications   nitrofurantoin, macrocrystal-monohydrate, (MACROBID) 100 MG capsule   fluconazole (DIFLUCAN) 150 MG tablet   Other Visit Diagnoses     Screening for endocrine, nutritional, metabolic and immunity disorder       Relevant Orders   CBC with Differential/Platelet   Comprehensive metabolic panel   Lipid panel   Hemoglobin A1c   TSH   VITAMIN D 25 Hydroxy (Vit-D Deficiency, Fractures)   B12 and Folate Panel        Return for TBD based on labs.      Demonta Wombles, Coralee Pesa, NP, DNP, AGNP-C Primary Care & Sports Medicine at [place]

## 2021-06-16 NOTE — Assessment & Plan Note (Signed)
UPT negative today. Recommend restart Nuva Ring for contraceptive management.  Patient has tolerated this well in the past.  Recommend f/u if she plans pregnancy in the future for discussion on recommendations with medications, supplements, and overall health.

## 2021-06-16 NOTE — Patient Instructions (Addendum)
Thank you for choosing [place] at [facility] for your Primary Care needs. I am excited for the opportunity to partner with you to meet your health care goals. It was a pleasure meeting you today!  Recommendations from today's visit: We will check some labs today and I will let you know if there is anything concerning.  I have sent in medication for your belly- this is a pill and you will take one today and one again in three days.  I have sent in medication for your hand - you can use it on your belly too- apply a thin layer twice a day for at least 7 days. For your back, we can get x-rays to see if there is anything concerning.  I sent in medication for the uti. This should clear up in the next 4-5 days.  Information on diet, exercise, and health maintenance recommendations are listed below. This is information to help you be sure you are on track for optimal health and monitoring.   Please look over this and let us know if you have any questions or if you have completed any of the health maintenance outside of [place] so that we can be sure your records are up to date.  ___________________________________________________________ About Me: I am an Adult-Geriatric Nurse Practitioner with a background in caring for patients for more than 20 years with a strong intensive care background. I provide primary care and sports medicine services to patients age 57 and older within this office. My education had a strong focus on caring for the older adult population, which I am passionate about. I am also the director of the APP Fellowship with [facility].   My desire is to provide you with the best service through preventive medicine and supportive care. I consider you a part of the medical team and value your input. I work diligently to ensure that you are heard and your needs are met in a safe and effective manner. I want you to feel comfortable with me as your provider and  want you to know that your health concerns are important to me.  For your information, our office hours are: Monday, Tuesday, and Thursday 8:00 AM - 5:00 PM Wednesday and Friday 8:00 AM - 12:00 PM.   In my time away from the office I am teaching new APP's within the system and am unavailable, but my partner, Dr. Burnard Bunting is in the office for emergent needs.   If you have questions or concerns, please call our office at [phone] or send [place] a MyChart message and we will respond as quickly as possible.  ____________________________________________________________ MyChart:  For all urgent or time sensitive needs we ask that you please call the office to avoid delays. Our number is [phone]. MyChart is not constantly monitored and due to the large volume of messages a day, replies may take up to 72 business hours.  MyChart Policy: MyChart allows for you to see your visit notes, after visit summary, provider recommendations, lab and tests results, make an appointment, request refills, and contact your provider or the office for non-urgent questions or concerns. Providers are seeing patients during normal business hours and do not have built in time to review MyChart messages.  We ask that you allow a minimum of 3 business days for responses to [employer]. For this reason, please do not send urgent requests through [place]. Please call the office at [phone]. New and ongoing conditions may require a  visit. We have virtual and in person visit available for your convenience.  Complex MyChart concerns may require a visit. Your provider may request you schedule a virtual or in person visit to ensure we are providing the best care possible. MyChart messages sent after 11:00 AM on Friday will not be received by the provider until Monday morning.    Lab and Test Results: You will receive your lab and test results on MyChart as soon as they are completed and results have been sent by the lab  or testing facility. Due to this service, you will receive your results BEFORE your provider.  I review lab and tests results each morning prior to seeing patients. Some results require collaboration with other providers to ensure you are receiving the most appropriate care. For this reason, we ask that you please allow a minimum of 3-5 business days from the time the ALL results have been received for your provider to receive and review lab and test results and contact you about these.  Most lab and test result comments from the provider will be sent through [place]. Your provider may recommend changes to the plan of care, follow-up visits, repeat testing, ask questions, or request an office visit to discuss these results. You may reply directly to this message or call the office at [phone] to provide information for the provider or set up an appointment. In some instances, you will be called with test results and recommendations. Please let us know if this is preferred and we will make note of this in your chart to provide this for you.    If you have not heard a response to your lab or test results in 5 business days from all results returning to [place], please call the office to let us know. We ask that you please avoid calling prior to this time unless there is an emergent concern. Due to high call volumes, this can delay the resulting process.  After Hours: For all non-emergency after hours needs, please call the office at [phone] and select the option to reach the on-call provider service. On-call services are shared between multiple [place] offices and therefore it will not be possible to speak directly with your provider. On-call providers may provide medical advice and recommendations, but are unable to provide refills for maintenance medications.  For all emergency or urgent medical needs after normal business hours, we recommend that you seek care at the closest Urgent Care or  Emergency Department to ensure appropriate treatment in a timely manner.  [facility] has a 24 hour emergency room located on the ground floor for your convenience.   Urgent Concerns During the Business Day Providers are seeing patients from 8AM to [place] with a busy schedule and are most often not able to respond to non-urgent calls until the end of the day or the next business day. If you should have URGENT concerns during the day, please call and speak to the nurse or schedule a same day appointment so that we can address your concern without delay.   Thank you, again, for choosing me as your health care partner. I appreciate your trust and look forward to learning more about you.   Worthy Keeler, DNP, AGNP-c ___________________________________________________________  Health Maintenance Recommendations Screening Testing Mammogram Every 1 -2 years based on history and risk factors Starting at age 22 Pap Smear Ages 21-39 every 3 years Ages 67-65 every 5 years with HPV testing More frequent testing may be  required based on results and history Colon Cancer Screening Every 1-10 years based on test performed, risk factors, and history Starting at age 22 Bone Density Screening Every 2-10 years based on history Starting at age 84 for women Recommendations for men differ based on medication usage, history, and risk factors AAA Screening One time ultrasound Men 1-27 years old who have every smoked Lung Cancer Screening Low Dose Lung CT every 12 months Age 31-80 years with a 30 pack-year smoking history who still smoke or who have quit within the last 15 years  Screening Labs Routine  Labs: Complete Blood Count (CBC), Complete Metabolic Panel (CMP), Cholesterol (Lipid Panel) Every 6-12 months based on history and medications May be recommended more frequently based on current conditions or previous results Hemoglobin A1c Lab Every 3-12 months based on history  and previous results Starting at age 52 or earlier with diagnosis of diabetes, high cholesterol, BMI >26, and/or risk factors Frequent monitoring for patients with diabetes to ensure blood sugar control Thyroid Panel (TSH w/ T3 & T4) Every 6 months based on history, symptoms, and risk factors May be repeated more often if on medication HIV One time testing for all patients 46 and older May be repeated more frequently for patients with increased risk factors or exposure Hepatitis C One time testing for all patients 2 and older May be repeated more frequently for patients with increased risk factors or exposure Gonorrhea, Chlamydia Every 12 months for all sexually active persons 13-24 years Additional monitoring may be recommended for those who are considered high risk or who have symptoms PSA Men 39-29 years old with risk factors Additional screening may be recommended from age 372-69 based on risk factors, symptoms, and history  Vaccine Recommendations Tetanus Booster All adults every 10 years Flu Vaccine All patients 6 months and older every year COVID Vaccine All patients 12 years and older Initial dosing with booster May recommend additional booster based on age and health history HPV Vaccine 2 doses all patients age 37-26 Dosing may be considered for patients over 26 Shingles Vaccine (Shingrix) 2 doses all adults 75 years and older Pneumonia (Pneumovax 23) All adults 1 years and older May recommend earlier dosing based on health history Pneumonia (Prevnar 45) All adults 46 years and older Dosed 1 year after Pneumovax 23  Additional Screening, Testing, and Vaccinations may be recommended on an individualized basis based on family history, health history, risk factors, and/or exposure.  __________________________________________________________  Diet Recommendations for All Patients  I recommend that all patients maintain a diet low in saturated fats, carbohydrates, and  cholesterol. While this can be challenging at first, it is not impossible and small changes can make big differences.  Things to try: Decreasing the amount of soda, sweet tea, and/or juice to one or less per day and replace with water While water is always the first choice, if you do not like water you may consider adding a water additive without sugar to improve the taste other sugar free drinks Replace potatoes with a brightly colored vegetable at dinner Use healthy oils, such as canola oil or olive oil, instead of butter or hard margarine Limit your bread intake to two pieces or less a day Replace regular pasta with low carb pasta options Bake, broil, or grill foods instead of frying Monitor portion sizes  Eat smaller, more frequent meals throughout the day instead of large meals  An important thing to remember is, if you love foods that are not great for  your health, you don't have to give them up completely. Instead, allow these foods to be a reward when you have done well. Allowing yourself to still have special treats every once in a while is a nice way to tell yourself thank you for working hard to keep yourself healthy.   Also remember that every day is a new day. If you have a bad day and "fall off the wagon", you can still climb right back up and keep moving along on your journey!  We have resources available to help you!  Some websites that may be helpful include: www.http://carter.biz/  Www.VeryWellFit.com _____________________________________________________________  Activity Recommendations for All Patients  I recommend that all adults get at least 20 minutes of moderate physical activity that elevates your heart rate at least 5 days out of the week.  Some examples include: Walking or jogging at a pace that allows you to carry on a conversation Cycling (stationary bike or outdoors) Water aerobics Yoga Weight lifting Dancing If physical limitations prevent you from putting  stress on your joints, exercise in a pool or seated in a chair are excellent options.  Do determine your MAXIMUM heart rate for activity: YOUR AGE - 220 = MAX HeartRate   Remember! Do not push yourself too hard.  Start slowly and build up your pace, speed, weight, time in exercise, etc.  Allow your body to rest between exercise and get good sleep. You will need more water than normal when you are exerting yourself. Do not wait until you are thirsty to drink. Drink with a purpose of getting in at least 8, 8 ounce glasses of water a day plus more depending on how much you exercise and sweat.    If you begin to develop dizziness, chest pain, abdominal pain, jaw pain, shortness of breath, headache, vision changes, lightheadedness, or other concerning symptoms, stop the activity and allow your body to rest. If your symptoms are severe, seek emergency evaluation immediately. If your symptoms are concerning, but not severe, please let us know so that we can recommend further evaluation.

## 2021-06-16 NOTE — Assessment & Plan Note (Signed)
Flesh colored clear pustules on palmar surface of pointer finger and middle finger of right hand and thumb of left hand with no erythema, edema, warmth, or drainage present.  Will treat with mometasone cream topically for 7 days.  F/U if sx worsen or fail to improve.  Allergen unknown.

## 2021-06-16 NOTE — Assessment & Plan Note (Signed)
Erythematous macular rash on abdomen at abdominal fold with purpuric discoloration consistent with candidal infection. No drainage present.  Will treat with oral fluconazole and topical mometasone cream.  F/U if sx worsen or fail to improve.

## 2021-06-16 NOTE — Assessment & Plan Note (Signed)
Chronic. Asymptomatic. Recent tests undetectable. UPT negative today.  Managing well with current regimen. Followed by ID.  No alarm symptoms present today. Will obtain basic labs for evaluation.  Single monogamous partner relationship.  No concerns. Will continue to collaborate with ID for future needs.

## 2021-06-16 NOTE — Assessment & Plan Note (Signed)
UA positive on evaluation today. UPT negative.  Will treat with nitrofurantoin for 5 days. F/U if symptoms worsen or fail to improve.  Suspect this could be contributing to back pain.

## 2021-06-17 ENCOUNTER — Other Ambulatory Visit (HOSPITAL_COMMUNITY): Payer: Self-pay

## 2021-06-17 ENCOUNTER — Other Ambulatory Visit (HOSPITAL_BASED_OUTPATIENT_CLINIC_OR_DEPARTMENT_OTHER): Payer: Self-pay | Admitting: Nurse Practitioner

## 2021-06-17 ENCOUNTER — Encounter (HOSPITAL_BASED_OUTPATIENT_CLINIC_OR_DEPARTMENT_OTHER): Payer: Self-pay | Admitting: Nurse Practitioner

## 2021-06-17 DIAGNOSIS — E559 Vitamin D deficiency, unspecified: Secondary | ICD-10-CM

## 2021-06-17 LAB — COMPREHENSIVE METABOLIC PANEL
ALT: 11 IU/L (ref 0–32)
AST: 18 IU/L (ref 0–40)
Albumin/Globulin Ratio: 1.7 (ref 1.2–2.2)
Albumin: 4.7 g/dL (ref 3.9–5.0)
Alkaline Phosphatase: 125 IU/L — ABNORMAL HIGH (ref 44–121)
BUN/Creatinine Ratio: 12 (ref 9–23)
BUN: 9 mg/dL (ref 6–20)
Bilirubin Total: 0.3 mg/dL (ref 0.0–1.2)
CO2: 20 mmol/L (ref 20–29)
Calcium: 9.1 mg/dL (ref 8.7–10.2)
Chloride: 103 mmol/L (ref 96–106)
Creatinine, Ser: 0.74 mg/dL (ref 0.57–1.00)
Globulin, Total: 2.7 g/dL (ref 1.5–4.5)
Glucose: 84 mg/dL (ref 70–99)
Potassium: 4.2 mmol/L (ref 3.5–5.2)
Sodium: 138 mmol/L (ref 134–144)
Total Protein: 7.4 g/dL (ref 6.0–8.5)
eGFR: 114 mL/min/{1.73_m2} (ref >59)

## 2021-06-17 LAB — HEMOGLOBIN A1C
Est. average glucose Bld gHb Est-mCnc: 105 mg/dL
Hgb A1c MFr Bld: 5.3 % (ref 4.8–5.6)

## 2021-06-17 LAB — CBC WITH DIFFERENTIAL/PLATELET
Basophils Absolute: 0.1 10*3/uL (ref 0.0–0.2)
Basos: 1 %
EOS (ABSOLUTE): 0.2 10*3/uL (ref 0.0–0.4)
Eos: 2 %
Hematocrit: 43.5 % (ref 34.0–46.6)
Hemoglobin: 14.4 g/dL (ref 11.1–15.9)
Immature Grans (Abs): 0 10*3/uL (ref 0.0–0.1)
Immature Granulocytes: 0 %
Lymphocytes Absolute: 2.3 10*3/uL (ref 0.7–3.1)
Lymphs: 24 %
MCH: 27.5 pg (ref 26.6–33.0)
MCHC: 33.1 g/dL (ref 31.5–35.7)
MCV: 83 fL (ref 79–97)
Monocytes Absolute: 0.7 10*3/uL (ref 0.1–0.9)
Monocytes: 7 %
Neutrophils Absolute: 6.3 10*3/uL (ref 1.4–7.0)
Neutrophils: 66 %
Platelets: 380 10*3/uL (ref 150–450)
RBC: 5.23 x10E6/uL (ref 3.77–5.28)
RDW: 14.1 % (ref 11.7–15.4)
WBC: 9.5 10*3/uL (ref 3.4–10.8)

## 2021-06-17 LAB — LIPID PANEL
Chol/HDL Ratio: 5.1 ratio — ABNORMAL HIGH (ref 0.0–4.4)
Cholesterol, Total: 202 mg/dL — ABNORMAL HIGH (ref 100–199)
HDL: 40 mg/dL (ref >39)
LDL Chol Calc (NIH): 133 mg/dL — ABNORMAL HIGH (ref 0–99)
Triglycerides: 161 mg/dL — ABNORMAL HIGH (ref 0–149)
VLDL Cholesterol Cal: 29 mg/dL (ref 5–40)

## 2021-06-17 LAB — VITAMIN D 25 HYDROXY (VIT D DEFICIENCY, FRACTURES): Vit D, 25-Hydroxy: 13.3 ng/mL — ABNORMAL LOW (ref 30.0–100.0)

## 2021-06-17 LAB — TSH: TSH: 2.75 u[IU]/mL (ref 0.450–4.500)

## 2021-06-17 LAB — B12 AND FOLATE PANEL
Folate: >20 ng/mL (ref >3.0)
Vitamin B-12: 360 pg/mL (ref 232–1245)

## 2021-06-17 MED ORDER — VITAMIN D (ERGOCALCIFEROL) 1.25 MG (50000 UNIT) PO CAPS: 50000.0000 [IU] | ORAL_CAPSULE | ORAL | 0 refills | Status: DC

## 2021-06-17 NOTE — Addendum Note (Signed)
Addended by: Carnella Guadalajara on: 06/17/2021 08:42 AM   Modules accepted: Orders

## 2021-06-17 NOTE — Progress Notes (Signed)
LVM to cb to schedule-- virtual visit to discuss hyperlipidemia.

## 2021-06-18 ENCOUNTER — Other Ambulatory Visit: Payer: Self-pay

## 2021-06-18 ENCOUNTER — Ambulatory Visit: Payer: Medicaid Other | Attending: Physical Medicine and Rehabilitation

## 2021-06-18 DIAGNOSIS — N393 Stress incontinence (female) (male): Secondary | ICD-10-CM | POA: Insufficient documentation

## 2021-06-18 DIAGNOSIS — M62838 Other muscle spasm: Secondary | ICD-10-CM | POA: Diagnosis not present

## 2021-06-18 DIAGNOSIS — M6281 Muscle weakness (generalized): Secondary | ICD-10-CM | POA: Diagnosis not present

## 2021-06-18 DIAGNOSIS — N819 Female genital prolapse, unspecified: Secondary | ICD-10-CM | POA: Insufficient documentation

## 2021-06-18 DIAGNOSIS — R279 Unspecified lack of coordination: Secondary | ICD-10-CM | POA: Insufficient documentation

## 2021-06-18 NOTE — Patient Instructions (Addendum)
Double-voiding: ? ?This technique is to help with post-void dribbling, or leaking a little bit when ?you stand up right after urinating. ? ?Use relaxed toileting mechanics to urinate as much as you feel like you ?have to without straining. ? ?Sit back upright from leaning forward and relax this way for 10-20 seconds. ? ?Lean forward again to finish voiding any amount more. ? ? ?Squatty potty: ?When your knees are level or below the level of your hips, pelvic floor ?muscles are pressed against rectum, preventing ease of bowel movement. ?By getting knees above the level of the hips, these pelvic floor muscles ?relax, allowing easier passage of bowel movement. ?? Ways to get knees above hips: ?o Training and development officer (7inch and 9inch versions) ?o Small stool ?o Roll of toilet paper under each foot ?o Hardback book or stack of magazines under each foot ? ?Relaxed Toileting mechanics: ?Once in this position, make sure to lean forward with forearms on thighs, ?wide knees, relaxed stomach, and breathe. ? ?The knack: ?Use this technique while coughing, laughing, sneezing, or with any activities ?that causes you to leak urine a little. ?Right before you perform one of these activities that increase pressure in ?the abdomen and pushes a little urine out, perform a pelvic floor muscle ?contraction and hold. ?If that does not completely stop the leaking, try tightening your thighs ?together in addition to performing a pelvic floor muscle contraction. ?Make sure you are not trying to stifle a cough, sneeze, or laugh; allow these ?activities in full as it will cause less pressure down into the bladder and ?pelvic floor muscles ? ?Access Code: [email or web address] ?URL: [email or web address] ?Date: 06/18/2021 ?Prepared by: Julio Alm ? ?Exercises ?Supine Pelvic Floor Contraction - 2 x daily - 7 x weekly - 2 sets - 10 reps ? ?

## 2021-06-18 NOTE — Therapy (Addendum)
OUTPATIENT PHYSICAL THERAPY FEMALE PELVIC EVALUATION   Patient Name: Erica Walton MRN: [id number] DOB:August 02, 1995, 26 y.o., female Today's Date: 06/18/2021   PT End of Session - 06/18/21 1146     Visit Number 1    Date for PT Re-Evaluation 09/10/21    Authorization Type [employer]    Authorization Time Period waiting for auth approval    PT Start Time 1111    PT Stop Time 1141    PT Time Calculation (min) 30 min    Activity Tolerance Patient tolerated treatment well    Behavior During Therapy [facility] for tasks assessed/performed             Past Medical History:  Diagnosis Date   Asthma    Genital herpes    HIV (human immunodeficiency virus infection) (HCC)    HIV positive (HCC) 06/16/2021   Past Surgical History:  Procedure Laterality Date   APPENDECTOMY     LAPAROSCOPIC APPENDECTOMY N/A 07/22/2019   Procedure: APPENDECTOMY LAPAROSCOPIC;  Surgeon: Carolan Shiver, MD;  Location: [facility] ORS;  Service: General;  Laterality: N/A;   Patient Active Problem List   Diagnosis Date Noted   Candidal intertrigo 06/16/2021   Dyshidrotic eczema 06/16/2021   Cystitis 06/16/2021   Encounter for initial prescription of vaginal ring hormonal contraceptive 01/27/2021   Low back pain 09/23/2020   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   Herpes 10/06/2013   Asthma 11/17/2011   Human immunodeficiency virus (HIV) disease (HCC) 11/17/2011    PCP: Tollie Eth, NP  REFERRING PROVIDER: Federico Flake,*  REFERRING DIAG: N81.9 (ICD-10-CM) - Female genital prolapse, unspecified type N39.3 (ICD-10-CM) - Stress incontinence of urine   THERAPY DIAG:  Muscle weakness (generalized)  Female genital prolapse, unspecified type  Stress incontinence of urine  Unspecified lack of coordination  Other muscle spasm  ONSET DATE: 04/21/2011  SUBJECTIVE:                                                                                                                                                                                            SUBJECTIVE STATEMENT: Pt states that she has had 5 vaginal deliveries and very minor tearing from 2013-2021. She states that she started noticing bladder prolapse after her 3rd delivery. After her last delivery, she is noticing rectocele and cystocele. She is having irregular menstrual cycles. She is having urinary incontinence with laughing/coughing/sneezing and does not feel like she's emptying completely.  Fluid intake: Yes: She states not enough water, she is working on cutting out water. Feels like when she does drink water, it goes right  through her. Caffeine in sodas.  Patient confirms identification and approves PT to assess pelvic floor and treatment Yes  PERTINENT HISTORY:  G5P5 with minor tearing and chronic low back pain Sexual abuse: No  PAIN:  Are you having pain? No   BOWEL MOVEMENT Pain with bowel movement: No Type of bowel movement:Frequency 1x every 2-3 days depending on diet - she tends to alternate between constipation and diarrhea and Strain Yes Fully empty rectum: No Leakage: No Pads: No Fiber supplement: No  URINATION Pain with urination: No Fully empty bladder: No Stream: Strong Urgency: Yes: she will leak on the way to toilet Frequency: up to 5 hours - not typically waking at night Leakage: Urge to void, Walking to the bathroom, Coughing, Sneezing, and Laughing Pads: Yes: sometimes - depends on the day  INTERCOURSE Pain with intercourse: Initial Penetration and During Penetration Ability to have vaginal penetration:  Yes: - Types of stimulation: - Climax: Yes: some pain  Marinoff Scale   PREGNANCY Vaginal deliveries 5 Tearing Yes: minor C-section deliveries 0 Currently pregnant No   PRECAUTIONS: None  WEIGHT BEARING RESTRICTIONS No  FALLS:  Has patient fallen in last 6 months? No, Number of falls: 0  LIVING ENVIRONMENT: Lives with: lives with their family Lives in:  House/apartment  OCCUPATION: stay at home mom  PLOF: Independent  PATIENT GOALS To decrease urinary leaking   OBJECTIVE:   COGNITION:  Overall cognitive status: Within functional limits for tasks assessed      POSTURE:  Forward head, rounded shoulders, posterior pelvic tilt  PALPATION: Internal Pelvic Floor Superficial muscles WNL, tenderness in deep pelvic floor muscles Rt>Lt described as achiness  External Perineal Exam WNL  GENERAL Mild tenderness on lower abdomen over bladder   PELVIC MMT:   MMT  06/18/2021  Vaginal 2/5 strength with poor relaxation; 3 second endurance, 5 reps  Internal Anal Sphincter   External Anal Sphincter   Puborectalis   Diastasis Recti 1 finger width separation at umbilicus  (Blank rows = not tested)   TONE: WNL  PROLAPSE: Grade 2 rectocele, Grade 1 cystocele    TODAY'S TREATMENT  EVAL Treatment consisted of pelvic floor active range of motion training with breath coordination, the knack, double-voiding, and education on use of squatty potty and leaning back with bowel movements.    PATIENT EDUCATION:  Education details: The knack, squatty potty, double voiding, pressure management, initial HEP Person educated: Patient Education method: Explanation, Demonstration, Tactile cues, Verbal cues, and Handouts Education comprehension: verbalized understanding   HOME EXERCISE PROGRAM: [id number]  ASSESSMENT:  CLINICAL IMPRESSION: Patient is a 26 y.o. female who was seen today for physical therapy evaluation and treatment for sensation of vaginal bulge and stress/urge urinary incontinence. Exam findings notable for Grade 2 posterior vaginal wall laxity, grade 1 anterior vaginal wall laxity, pelvic floor strength 2/5, decreased pelvic floor endurance 3 seconds, decreased coordination of pelvic floor muscle contractions with multiple contractions, and moderate deep pelvic floor tenderness bil. Signs and symptoms are most consistent with  pelvic floor weakness and poor bladder/bowel habits. Initial treatment consisted of the knack, double-voiding, no greater than 3 hour voiding window, squatty potty, and initial pelvic floor strengthening program. She will benefit from skilled PT intervention in order to address impairments, decrease urinary incontinence, and improve QOL.    OBJECTIVE IMPAIRMENTS decreased activity tolerance, decreased coordination, decreased endurance, decreased mobility, decreased strength, hypomobility, increased muscle spasms, impaired tone, and pain.   ACTIVITY LIMITATIONS community activity.   PERSONAL FACTORS 1 comorbidity: G5P5  are also affecting patient's  functional outcome.    REHAB POTENTIAL: Good  CLINICAL DECISION MAKING: Stable/uncomplicated  EVALUATION COMPLEXITY: Low   GOALS: Goals reviewed with patient? Yes  SHORT TERM GOALS:  STG Name Target Date Goal status  1 Pt will be independent with HEP.  Baseline:  07/16/2021 INITIAL  2 Pt will be independent with the knack, urge suppression technique, and double voiding in order to improve bladder habits and decrease urinary incontinence.  Baseline:  07/16/2021 INITIAL  3 Pt will be able to correctly perform diaphragmatic breathing and appropriate pressure management in order to prevent worsening vaginal wall laxity and improve pelvic floor A/ROM.  Baseline: 07/16/2021 INITIAL  4     5     6     7       LONG TERM GOALS:   LTG Name Target Date Goal status  1 Pt will be independent with advanced HEP.  Baseline: 09/10/2021 INITIAL  2 Pt will be independent with use of squatty potty, relaxed toileting mechanics, and improved bowel movement techniques in order to increase ease of bowel movements and complete evacuation.  Baseline: 09/10/2021 INITIAL  3 Pt will report no leaks with laughing, coughing, sneezing in order to improve comfort with interpersonal relationships and community activities.  Baseline: 07/16/2021 INITIAL  4 Pt will  demonstrate normal pelvic floor muscle tone and A/ROM, able to achieve 4/5 strength with contractions and 10 sec endurance, in order to provide appropriate lumbopelvic support in functional activities.  Baseline: 09/10/2021 INITIAL  5 Pt will report 0/10 pain with vaginal penetration in order to improve intimate relationship with partner.   Baseline: 09/10/2021 INITIAL             PLAN: PT FREQUENCY: 1x/week  PT DURATION: 12 weeks  PLANNED INTERVENTIONS: Therapeutic exercises, Therapeutic activity, Neuromuscular re-education, Balance training, Gait training, Patient/Family education, Joint mobilization, and Dry Needling  PLAN FOR NEXT SESSION: Plan to progress pelvic floor strengthening to seated and standing positions; begin core/hip strengthening; mobility and down training to make sure deeper pelvic floor tension does not worsen.    Jule Economy, PT 06/18/2021, 1:44 PM

## 2021-06-23 ENCOUNTER — Other Ambulatory Visit: Payer: Self-pay

## 2021-06-23 ENCOUNTER — Ambulatory Visit (INDEPENDENT_AMBULATORY_CARE_PROVIDER_SITE_OTHER): Payer: Medicaid Other | Admitting: Nurse Practitioner

## 2021-06-23 ENCOUNTER — Encounter (HOSPITAL_BASED_OUTPATIENT_CLINIC_OR_DEPARTMENT_OTHER): Payer: Self-pay | Admitting: Nurse Practitioner

## 2021-06-23 DIAGNOSIS — E782 Mixed hyperlipidemia: Secondary | ICD-10-CM

## 2021-06-23 DIAGNOSIS — Z8349 Family history of other endocrine, nutritional and metabolic diseases: Secondary | ICD-10-CM

## 2021-06-23 HISTORY — DX: Family history of other endocrine, nutritional and metabolic diseases: Z83.49

## 2021-06-23 HISTORY — DX: Mixed hyperlipidemia: E78.2

## 2021-06-23 MED ORDER — ATORVASTATIN CALCIUM 20 MG PO TABS
20.0000 mg | ORAL_TABLET | Freq: Every day | ORAL | 3 refills | Status: DC
Start: 2021-06-23 — End: 2021-11-17

## 2021-06-23 NOTE — Assessment & Plan Note (Signed)
Patient endorses family history of Wilson's disease in her mother.  She recently learned that her husband also has a family history positive for this.  She has never been tested for this but would like to see if she is possibly a carrier so that she can ensure proper medical care for her children. ?We will place order for labs for patient to have done when she comes in for follow-up on her lipids in approximately 3 months. ? ?

## 2021-06-23 NOTE — Assessment & Plan Note (Signed)
Mixed hyperlipidemia.  Strongly suspect familial origin given findings. ?Recommend start with atorvastatin at bedtime in addition to monitoring saturated fats and reducing fried foods in diet. ?Also recommend increased physical activity with walking every day.  We will closely monitor and plan to treat aggressively as she does have increased risk factors for cardiovascular disease. ?Plan to follow-up in approximately 3 months for additional labs. ?

## 2021-06-23 NOTE — Progress Notes (Signed)
Virtual Visit Encounter  telephone visit. ? ? ?I connected with  Erica Walton on 06/23/21 at  1:50 PM EST by secure audio and/or video enabled telemedicine application. I verified that I am speaking with the correct person using two identifiers. ?  ?I introduced myself as a Publishing rights manager with the practice. The limitations of evaluation and management by telemedicine discussed with the patient and the availability of in person appointments. The patient expressed verbal understanding and consent to proceed. ? ?Participating parties in this visit include: Myself and patient ? ?The patient is: Patient Location: Home ?I am: Provider Location: Office/Clinic ?Subjective:   ? ?CC and HPI: Erica Walton is a 26 y.o. year old female presenting for new evaluation and treatment of elevated cholesterol on recent labs.  ?Kameren endorses concern over her elevated lipids as she is at an increased risk of cardiovascular disease with her current HIV status.  She is interested in medication and dietary changes that may help control her symptoms.  She denies CP, palpitations, shortness of breath, LE edema, dizziness. ? ?Past medical history, Surgical history, Family history not pertinant except as noted below, Social history, Allergies, and medications have been entered into the medical record, reviewed, and corrections made.  ? ?Review of Systems:  ?All review of systems negative except what is listed in the HPI ? ?Objective:   ? ?Alert and oriented x 4 ?Speaking in clear sentences with no shortness of breath. ?No distress. ? ?Impression and Recommendations:   ? ?Problem List Items Addressed This Visit   ? ? Mixed hyperlipidemia - Primary  ?  Mixed hyperlipidemia.  Strongly suspect familial origin given findings. ?Recommend start with atorvastatin at bedtime in addition to monitoring saturated fats and reducing fried foods in diet. ?Also recommend increased physical activity with walking every day.  We will closely  monitor and plan to treat aggressively as she does have increased risk factors for cardiovascular disease. ?Plan to follow-up in approximately 3 months for additional labs. ?  ?  ? Relevant Medications  ? atorvastatin (LIPITOR) 20 MG tablet  ? Family history of Wilson's disease  ?  Patient endorses family history of Wilson's disease in her mother.  She recently learned that her husband also has a family history positive for this.  She has never been tested for this but would like to see if she is possibly a carrier so that she can ensure proper medical care for her children. ?We will place order for labs for patient to have done when she comes in for follow-up on her lipids in approximately 3 months. ? ?  ?  ? ? ?orders and follow up as documented in EMR ?I discussed the assessment and treatment plan with the patient. The patient was provided an opportunity to ask questions and all were answered. The patient agreed with the plan and demonstrated an understanding of the instructions. ?  ?The patient was advised to call back or seek an in-person evaluation if the symptoms worsen or if the condition fails to improve as anticipated. ? ?Follow-Up: in 3 months ? ?I provided 22 minutes of non-face-to-face interaction with this non face-to-face encounter including intake, same-day documentation, and chart review.  ? ?Tollie Eth, NP , DNP, AGNP-c ?[facility] ?Primary Care & Sports Medicine at [facility] ?[phone] ?[phone] (fax) ? ?

## 2021-06-25 ENCOUNTER — Other Ambulatory Visit: Payer: Self-pay

## 2021-06-25 ENCOUNTER — Ambulatory Visit: Payer: Medicaid Other | Admitting: Infectious Diseases

## 2021-06-25 ENCOUNTER — Encounter: Payer: Self-pay | Admitting: Infectious Diseases

## 2021-06-25 VITALS — BP 128/72 | HR 89 | Temp 98.3°F | Resp 16 | Ht <= 58 in | Wt 157.4 lb

## 2021-06-25 DIAGNOSIS — F53 Postpartum depression: Secondary | ICD-10-CM

## 2021-06-25 DIAGNOSIS — B20 Human immunodeficiency virus [HIV] disease: Secondary | ICD-10-CM | POA: Diagnosis not present

## 2021-06-25 MED ORDER — TIVICAY 50 MG PO TABS: 50.0000 mg | ORAL_TABLET | Freq: Every evening | ORAL | 5 refills | Status: DC

## 2021-06-25 MED ORDER — DESCOVY 200-25 MG PO TABS: 1.0000 | ORAL_TABLET | Freq: Every evening | ORAL | 5 refills | Status: DC

## 2021-06-25 NOTE — Progress Notes (Signed)
? ?Name: Erica Walton  ?DOB: [birth year] ?MRN: [id number] ?PCP: [name], NP  ? ? ? ?Brief Narrative:  ?Erica Walton is a 26 y.o. female with well controlled HIV, Dx 2013 with routine pregnancy testing. Has always been on treatment and previously in care through [facility].  ?CD4 nadir 300  ?HIV Risk: heterosexual  ?History of OIs: none known  ?Intake Labs 06/2019: ?Hep B sAg (-), sAb (+), cAb (-); Hep A (-), Hep C (-) ?Quantiferon (-) ?HLA B*5701 (-) ?G6PD: () ? ? ?Previous Regimens: ?Truvada + Prezista/Ritonovir ?Stribild ?Genvoya --> undetectable  ?Tivicay + Truvada during pregnancy 2021 - 2022 ?Cabenuva  ?Tivicay + Truvada 03/2021 with desire of pregnancy  ? ? ?Genotypes: ?2013 - K103N  ? ? ? ?Subjective:  ? ?Chief Complaint  ?Patient presents with  ? Follow-up  ?  B20   ?  ?  ? ?HPI: ?Doing well on Tivicay + Descovy. Holding off on pregnancy for now and preparing for contraception with nuvaring this week after her period is gone.  ? ?Weaned off her Lexapro now that her little one is older and less stress at home. Has great support with her husband too and feels that their relationship helps depression moments too.  ? ?Lost her job but has an upcoming interview with [employer].  ? ? ?Review of Systems  ?Constitutional:  Negative for chills and fever.  ?HENT:  Negative for tinnitus.   ?Eyes:  Negative for blurred vision and photophobia.  ?Respiratory:  Negative for cough and sputum production.   ?Cardiovascular:  Negative for chest pain.  ?Gastrointestinal:  Negative for diarrhea, nausea and vomiting.  ?Genitourinary:  Negative for dysuria.  ?Skin:  Negative for rash.  ?Neurological:  Negative for headaches.  ? ? ?Past Medical History:  ?Diagnosis Date  ? Asthma   ? Genital herpes   ? HIV (human immunodeficiency virus infection) ([place])   ? HIV positive ([place]) 06/16/2021  ? ? ?Outpatient Medications Prior to Visit  ?Medication Sig Dispense Refill  ? albuterol (VENTOLIN HFA) 108 (90 Base) MCG/ACT  inhaler Inhale 2 puffs into the lungs every 6 (six) hours as needed for wheezing or shortness of breath. 1 each 2  ? etonogestrel-ethinyl estradiol (NUVARING) 0.12-0.015 MG/24HR vaginal ring Insert vaginally and leave in place for 3 consecutive weeks, then remove for 1 week. 3 each 3  ? fluconazole (DIFLUCAN) 150 MG tablet Take one tablet by mouth today and take second tablet by mouth in 3 days. If symptoms do not go away, refill and repeat. For rash 2 tablet 2  ? mometasone (ELOCON) 0.1 % cream Apply thin layer to rash twice a day for 7 days. 15 g 1  ? dolutegravir (TIVICAY) 50 MG tablet Take 1 tablet (50 mg total) by mouth daily. (Patient taking differently: Take 50 mg by mouth every evening.) 30 tablet 5  ? emtricitabine-tenofovir AF (DESCOVY) 200-25 MG tablet Take 1 tablet by mouth daily. (Patient taking differently: Take 1 tablet by mouth every evening.) 30 tablet 5  ? atorvastatin (LIPITOR) 20 MG tablet Take 1 tablet (20 mg total) by mouth daily. (Patient not taking: Reported on 06/25/2021) 90 tablet 3  ? Prenatal Vit-Fe Phos-FA-Omega (VITAFOL GUMMIES) 3.33-0.333-34.8 MG CHEW Chew 3 tablets by mouth daily. (Patient not taking: Reported on 06/25/2021) 90 tablet 5  ? Vitamin D, Ergocalciferol, (DRISDOL) 1.25 MG (50000 UNIT) CAPS capsule Take 1 capsule (50,000 Units total) by mouth every 7 (seven) days. Take for 12 total doses(weeks) than  can transition to 1000 units OTC supplement daily (Patient not taking: Reported on 06/25/2021) 12 capsule 0  ? nitrofurantoin, macrocrystal-monohydrate, (MACROBID) 100 MG capsule Take 1 capsule (100 mg total) by mouth 2 (two) times daily. For UTI (Patient not taking: Reported on 06/25/2021) 10 capsule 0  ? ?No facility-administered medications prior to visit.  ?  ? ?Allergies  ?Allergen Reactions  ? Latex Rash  ? Norco [Hydrocodone-Acetaminophen] Rash  ? Penicillins Rash and Other (See Comments)  ?  Has patient had a PCN reaction causing immediate rash, facial/tongue/throat swelling,  SOB or lightheadedness with hypotension: yes, Rash:[id number]} ?Has patient had a PCN reaction causing severe rash involving mucus membranes or skin necrosis: no:[id number]} ?Has patient had a PCN reaction that required hospitalization no:[id number]} ?Has patient had a PCN reaction occurring within the last 10 years: HR:[id number]} ?If all of the above answers are "NO", then may proceed with Cephalosporin use. ?  ? ? ?Social History  ? ?Tobacco Use  ? Smoking status: Never  ? Smokeless tobacco: Never  ?Substance Use Topics  ? Alcohol use: Not Currently  ?  Comment: 2018  ? Drug use: Never  ? ? ?Social History  ? ?Substance and Sexual Activity  ?Sexual Activity Yes  ? Birth control/protection: None  ? Comment: [place]CL[place]ED [place]DOMS  ? ? ? ?Objective:  ? ?Vitals:  ? 06/25/21 1520  ?BP: 128/72  ?Pulse: 89  ?Resp: 16  ?Temp: 98.3 ?F (36.8 ?C)  ?TempSrc: Temporal  ?SpO2: 99%  ?Weight: 157 lb 6.4 oz (71.4 kg)  ?Height: '4\' 10"'  (1.473 m)  ? ?Body mass index is 32.9 kg/m?. ? ? ? ?Physical Exam ?Constitutional:   ?   Appearance: Normal appearance. She is not ill-appearing.  ?HENT:  ?   Mouth/Throat:  ?   Mouth: Mucous membranes are moist.  ?   Pharynx: Oropharynx is clear.  ?Eyes:  ?   General: No scleral icterus. ?Cardiovascular:  ?   Rate and Rhythm: Normal rate and regular rhythm.  ?Pulmonary:  ?   Effort: Pulmonary effort is normal.  ?Neurological:  ?   Mental Status: She is oriented to person, place, and time.  ?Psychiatric:     ?   Mood and Affect: Mood normal.     ?   Thought Content: Thought content normal.  ? ? ?Lab Results ?Lab Results  ?Component Value Date  ? WBC 9.5 06/16/2021  ? HGB 14.4 06/16/2021  ? HCT 43.5 06/16/2021  ? MCV 83 06/16/2021  ? PLT 380 06/16/2021  ?  ?Lab Results  ?Component Value Date  ? CREATININE 0.74 06/16/2021  ? BUN 9 06/16/2021  ? NA 138 06/16/2021  ? K 4.2 06/16/2021  ? CL 103 06/16/2021  ? CO2 20 06/16/2021  ?  ?Lab Results  ?Component Value Date  ? ALT 11 06/16/2021  ? AST 18 06/16/2021  ? ALKPHOS  125 (H) 06/16/2021  ? BILITOT 0.3 06/16/2021  ?  ?Lab Results  ?Component Value Date  ? CHOL 202 (H) 06/16/2021  ? HDL 40 06/16/2021  ? LDLCALC 133 (H) 06/16/2021  ? TRIG 161 (H) 06/16/2021  ? CHOLHDL 5.1 (H) 06/16/2021  ? ?HIV 1 RNA Quant (Copies/mL)  ?Date Value  ?01/27/2021 <20 (H)  ?11/25/2020 Not Detected  ?09/23/2020 <20 (H)  ? ?HIV-1 RNA Viral Load (copies/mL)  ?Date Value  ?03/06/2020 <20  ? ?CD4 T Cell Abs (/uL)  ?Date Value  ?01/27/2021 990  ?09/23/2020 857  ?05/02/2020 948  ? ? ? ?Assessment & Plan:  ? ?  Patient Active Problem List  ? Diagnosis Date Noted  ? Mixed hyperlipidemia 06/23/2021  ? Family history of Wilson's disease 06/23/2021  ? Dyshidrotic eczema 06/16/2021  ? Encounter for initial prescription of vaginal ring hormonal contraceptive 01/27/2021  ? Low back pain 09/23/2020  ? 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 Herpes 10/06/2013  ? Asthma 11/17/2011  ? Human immunodeficiency virus (HIV) disease ([place]) 11/17/2011  ? ? ?Problem List Items Addressed This Visit   ? ?  ? Unprioritized  ? Human immunodeficiency virus (HIV) disease ([place]) - Primary  ?  Doing well on Tivicay and Descovy. Will continue this for her - she does not want to change too much at this point with consideration of pregnancy in the next 12 months or so.  ?Will check VL today with recent medication switch.  ?Contraception with NuvaRing for now.  ?RTC in 4m I asked her to please let me know when she achieves pregnancy.  ?  ?  ? Relevant Medications  ? dolutegravir (TIVICAY) 50 MG tablet  ? emtricitabine-tenofovir AF (DESCOVY) 200-25 MG tablet  ? Other Relevant Orders  ? HIV 1 RNA quant-no reflex-bld  ? Post partum depression  ?  Weaned off lexapro and doing well off. Will continue to monitor her symptoms off medicatin.  ?  ?  ? ?SJanene Madeira MSN, NP-C ?[facility] ?[facility] ?Pager: [phone]?Office: [phone]? ?06/25/21  ?3:53 PM ? ? ?

## 2021-06-25 NOTE — Assessment & Plan Note (Signed)
Doing well on Tivicay and Descovy. Will continue this for her - she does not want to change too much at this point with consideration of pregnancy in the next 12 months or so.  ?Will check VL today with recent medication switch.  ?Contraception with NuvaRing for now.  ?RTC in 52m. I asked her to please let me know when she achieves pregnancy.  ?

## 2021-06-25 NOTE — Assessment & Plan Note (Signed)
Weaned off lexapro and doing well off. Will continue to monitor her symptoms off medicatin.  ?

## 2021-06-25 NOTE — Patient Instructions (Signed)
Nice to see you! ? ?Please stop by on your way out to check some blood work  ? ?Will see you back in 6 months! ?

## 2021-06-26 ENCOUNTER — Ambulatory Visit: Payer: Medicaid Other | Admitting: Infectious Diseases

## 2021-06-28 LAB — HIV-1 RNA QUANT-NO REFLEX-BLD
HIV 1 RNA Quant: NOT DETECTED Copies/mL
HIV-1 RNA Quant, Log: NOT DETECTED Log cps/mL

## 2021-06-30 NOTE — Therapy (Incomplete)
OUTPATIENT PHYSICAL THERAPY TREATMENT NOTE   Patient Name: Erica Walton MRN: [id number] DOB:[birth year], 26 y.o., female Today's Date: 06/30/2021  PCP: [name], NP REFERRING PROVIDER: Caren Macadam,*    Past Medical History:  Diagnosis Date   Asthma    Genital herpes    HIV (human immunodeficiency virus infection) ([place])    HIV positive ([place]) 06/16/2021   Past Surgical History:  Procedure Laterality Date   APPENDECTOMY     LAPAROSCOPIC APPENDECTOMY N/A 07/22/2019   Procedure: APPENDECTOMY LAPAROSCOPIC;  Surgeon: Herbert Pun, MD;  Location: [facility] ORS;  Service: General;  Laterality: N/A;   Patient Active Problem List   Diagnosis Date Noted   Mixed hyperlipidemia 06/23/2021   Family history of Wilson's disease 06/23/2021   Dyshidrotic eczema 06/16/2021   Encounter for initial prescription of vaginal ring hormonal contraceptive 01/27/2021   Low back pain 09/23/2020   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  PERTINENT HISTORY: G5P5 with minor tearing and chronic low back pain  PRECAUTIONS: NA  SUBJECTIVE: ***  PAIN:  Are you having pain? {OPRCPAIN:27236}                                                                                                                                                                                       SUBJECTIVE STATEMENT FROM EVAL 06/18/2021: Pt states that she has had 5 vaginal deliveries and very minor tearing from 2013-2021. She states that she started noticing bladder prolapse after her 3rd delivery. After her last delivery, she is noticing rectocele and cystocele. She is having irregular menstrual cycles. She is having urinary incontinence with  laughing/coughing/sneezing and does not feel like she's emptying completely.  Fluid intake: Yes: She states not enough water, she is working on cutting out water. Feels like when she does drink water, it goes right  through her. Caffeine in sodas.      BOWEL MOVEMENT Pain with bowel movement: No Type of bowel movement:Frequency 1x every 2-3 days depending on diet - she tends to alternate between constipation and diarrhea and Strain Yes Fully empty rectum: No Leakage: No Pads: No Fiber supplement: No   URINATION Pain with urination: No Fully empty bladder: No Stream: Strong Urgency: Yes: she will leak on the way to toilet Frequency: up to 5 hours - not typically waking at night Leakage: Urge to void, Walking to the bathroom, Coughing, Sneezing, and Laughing Pads: Yes:  sometimes - depends on the day   INTERCOURSE Pain with intercourse: Initial Penetration and During Penetration Ability to have vaginal penetration:  Yes: - Types of stimulation: - Climax: Yes: some pain  Marinoff Scale    PREGNANCY Vaginal deliveries 5 Tearing Yes: minor C-section deliveries 0 Currently pregnant No    OBJECTIVE from 06/18/21:    COGNITION:            Overall cognitive status: Within functional limits for tasks assessed                            POSTURE:  Forward head, rounded shoulders, posterior pelvic tilt   PALPATION: Internal Pelvic Floor Superficial muscles WNL, tenderness in deep pelvic floor muscles Rt>Lt described as achiness   External Perineal Exam WNL   GENERAL Mild tenderness on lower abdomen over bladder     PELVIC MMT:   MMT   06/18/2021  Vaginal 2/5 strength with poor relaxation; 3 second endurance, 5 reps  Internal Anal Sphincter    External Anal Sphincter    Puborectalis    Diastasis Recti 1 finger width separation at umbilicus  (Blank rows = not tested)     TONE: WNL   PROLAPSE: Grade 2 rectocele, Grade 1 cystocele       TODAY'S TREATMENT  07/01/2021:  TREATMENT 06/18/2021   EVAL Treatment consisted of pelvic floor active range of motion training with breath coordination, the knack, double-voiding, and education on use of squatty potty and leaning back with bowel movements.      PATIENT EDUCATION:  Education details: The knack, squatty potty, double voiding, pressure management, initial HEP Person educated: Patient Education method: Explanation, Demonstration, Tactile cues, Verbal cues, and Handouts Education comprehension: verbalized understanding     HOME EXERCISE PROGRAM: [id number]   ASSESSMENT:   CLINICAL IMPRESSION: Patient is a 26 y.o. female who was seen today for physical therapy evaluation and treatment for sensation of vaginal bulge and stress/urge urinary incontinence. Exam findings notable for Grade 2 posterior vaginal wall laxity, grade 1 anterior vaginal wall laxity, pelvic floor strength 2/5, decreased pelvic floor endurance 3 seconds, decreased coordination of pelvic floor muscle contractions with multiple contractions, and moderate deep pelvic floor tenderness bil. Signs and symptoms are most consistent with pelvic floor weakness and poor bladder/bowel habits. Initial treatment consisted of the knack, double-voiding, no greater than 3 hour voiding window, squatty potty, and initial pelvic floor strengthening program. She will benefit from skilled PT intervention in order to address impairments, decrease urinary incontinence, and improve QOL.      OBJECTIVE IMPAIRMENTS decreased activity tolerance, decreased coordination, decreased endurance, decreased mobility, decreased strength, hypomobility, increased muscle spasms, impaired tone, and pain.    ACTIVITY LIMITATIONS community activity.    PERSONAL FACTORS 1 comorbidity: G5P5  are also affecting patient's functional outcome.      REHAB POTENTIAL: Good   CLINICAL DECISION MAKING: Stable/uncomplicated   EVALUATION COMPLEXITY: Low     GOALS: Goals  reviewed with patient? Yes   SHORT TERM GOALS:   STG Name Target Date Goal status  1 Pt will be independent with HEP.  Baseline:  07/16/2021 INITIAL  2 Pt will be independent with the knack, urge suppression technique, and double voiding in order to improve bladder habits and decrease urinary incontinence.  Baseline:  07/16/2021 INITIAL  3 Pt will be able to correctly perform diaphragmatic breathing and appropriate pressure management in order to prevent worsening vaginal wall laxity and  improve pelvic floor A/ROM.  Baseline: 07/16/2021 INITIAL  4        5        6        7           LONG TERM GOALS:    LTG Name Target Date Goal status  1 Pt will be independent with advanced HEP.  Baseline: 09/10/2021 INITIAL  2 Pt will be independent with use of squatty potty, relaxed toileting mechanics, and improved bowel movement techniques in order to increase ease of bowel movements and complete evacuation.  Baseline: 09/10/2021 INITIAL  3 Pt will report no leaks with laughing, coughing, sneezing in order to improve comfort with interpersonal relationships and community activities.  Baseline: 07/16/2021 INITIAL  4 Pt will demonstrate normal pelvic floor muscle tone and A/ROM, able to achieve 4/5 strength with contractions and 10 sec endurance, in order to provide appropriate lumbopelvic support in functional activities.  Baseline: 09/10/2021 INITIAL  5 Pt will report 0/10 pain with vaginal penetration in order to improve intimate relationship with partner.   Baseline: 09/10/2021 INITIAL                      PLAN: PT FREQUENCY: 1x/week   PT DURATION: 12 weeks   PLANNED INTERVENTIONS: Therapeutic exercises, Therapeutic activity, Neuromuscular re-education, Balance training, Gait training, Patient/Family education, Joint mobilization, and Dry Needling   PLAN FOR NEXT SESSION: Plan to progress pelvic floor strengthening to seated and standing positions; begin core/hip strengthening; mobility and down  training to make sure deeper pelvic floor tension does not worsen.     Jule Economy, PT 06/30/2021, 4:09 PM

## 2021-07-01 ENCOUNTER — Other Ambulatory Visit: Payer: Self-pay

## 2021-07-01 ENCOUNTER — Ambulatory Visit: Payer: Medicaid Other

## 2021-07-01 DIAGNOSIS — M62838 Other muscle spasm: Secondary | ICD-10-CM | POA: Diagnosis not present

## 2021-07-01 DIAGNOSIS — M6281 Muscle weakness (generalized): Secondary | ICD-10-CM

## 2021-07-01 DIAGNOSIS — R279 Unspecified lack of coordination: Secondary | ICD-10-CM | POA: Diagnosis not present

## 2021-07-01 DIAGNOSIS — N393 Stress incontinence (female) (male): Secondary | ICD-10-CM | POA: Diagnosis not present

## 2021-07-01 DIAGNOSIS — N819 Female genital prolapse, unspecified: Secondary | ICD-10-CM | POA: Diagnosis not present

## 2021-07-07 ENCOUNTER — Telehealth: Payer: Self-pay

## 2021-07-07 ENCOUNTER — Ambulatory Visit: Payer: Medicaid Other | Admitting: Internal Medicine

## 2021-07-07 NOTE — Telephone Encounter (Signed)
Called and left message about no-show on 07/07/21 at 3:30. Asked her to call and confirm next appointment. Informed of no-show policy.  ?

## 2021-07-07 NOTE — Therapy (Deleted)
?OUTPATIENT PHYSICAL THERAPY TREATMENT NOTE ? ? ?Patient Name: Erica Walton ?MRN: [id number] ?DOB:[birth year], 26 y.o., female ?Today's Date: 07/07/2021 ? ?PCP: [name], NP ?REFERRING PROVIDER: No ref. provider found ? ? ? ? ?Past Medical History:  ?Diagnosis Date  ? Asthma   ? Genital herpes   ? HIV (human immunodeficiency virus infection) ([place])   ? HIV positive ([place]) 06/16/2021  ? ?Past Surgical History:  ?Procedure Laterality Date  ? APPENDECTOMY    ? LAPAROSCOPIC APPENDECTOMY N/A 07/22/2019  ? Procedure: APPENDECTOMY LAPAROSCOPIC;  Surgeon: Herbert Pun, MD;  Location: [facility] ORS;  Service: General;  Laterality: N/A;  ? ?Patient Active Problem List  ? Diagnosis Date Noted  ? Mixed hyperlipidemia 06/23/2021  ? Family history of Wilson's disease 06/23/2021  ? Dyshidrotic eczema 06/16/2021  ? Encounter for initial prescription of vaginal ring hormonal contraceptive 01/27/2021  ? Low back pain 09/23/2020  ? 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 Herpes 10/06/2013  ? Asthma 11/17/2011  ? Human immunodeficiency virus (HIV) disease ([place]) 11/17/2011  ? ? ?REFERRING DIAG: N81.9 (ICD-10-CM) - Female genital prolapse, unspecified type ?N39.3 (ICD-10-CM) - Stress incontinence of urine ? ?THERAPY DIAG:  ?No diagnosis found. ? ?PERTINENT HISTORY: G5P5 with minor tearing and chronic low back pain ? ?PRECAUTIONS: NA ? ?SUBJECTIVE: Pt states that she has been working on [employer]. She feels like double voiding is very helpful. She has been working on 3 hour voiding schedule, but feels like it is very difficult and she has been going every 4 hours. She feels like leaking with coughing/laughing/sneezing is improving.  ? ?PAIN:  ?Are you having pain? No ? ?                                                                                                                                                               ?  ?SUBJECTIVE STATEMENT FROM EVAL 06/18/2021: ?Pt states that she has had 5 vaginal deliveries and very minor  tearing from 2013-2021. She states that she started noticing bladder prolapse after her 3rd delivery. After her last delivery, she is noticing rectocele and cystocele. She is having irregular menstrual cycles. She is having urinary incontinence with laughing/coughing/sneezing and does not feel like she's emptying completely.  ?Fluid intake: Yes: She states not enough water, she is working on cutting out water. Feels like when she does drink water, it goes right  through her. Caffeine in sodas.    ?  ?BOWEL MOVEMENT ?Pain with bowel movement: No ?Type of bowel movement:Frequency 1x every 2-3 days depending on diet - she tends to alternate between constipation and diarrhea and Strain Yes ?Fully empty rectum: No ?Leakage: No ?Pads: No ?Fiber supplement: No ?  ?URINATION ?Pain with urination: No ?Fully empty bladder: No ?Stream: Strong ?Urgency: Yes: she will  leak on the way to toilet ?Frequency: up to 5 hours - not typically waking at night ?Leakage: Urge to void, Walking to the bathroom, Coughing, Sneezing, and Laughing ?Pads: Yes: sometimes - depends on the day ?  ?INTERCOURSE ?Pain with intercourse: Initial Penetration and During Penetration ?Ability to have vaginal penetration:  Yes: - ?Types of stimulation: - ?Climax: Yes: some pain  ?Marinoff Scale  ?  ?PREGNANCY ?Vaginal deliveries 5 ?Tearing Yes: minor ?C-section deliveries 0 ?Currently pregnant No ? ?  ?OBJECTIVE from 06/18/21:  ?  ?COGNITION: ?           Overall cognitive status: Within functional limits for tasks assessed              ?            ?  ?POSTURE:  ?Forward head, rounded shoulders, posterior pelvic tilt ?  ?PALPATION: ?Internal Pelvic Floor Superficial muscles WNL, tenderness in deep pelvic floor muscles Rt>Lt described as achiness ?  ?External Perineal Exam WNL ?  ?GENERAL Mild tenderness on lower abdomen over bladder ?  ?  ?PELVIC MMT: ?  ?MMT   ?06/18/2021  ?Vaginal 2/5 strength with poor relaxation; 3 second endurance, 5 reps  ?Internal Anal  Sphincter    ?External Anal Sphincter    ?Puborectalis    ?Diastasis Recti 1 finger width separation at umbilicus  ?(Blank rows = not tested) ?  ?  ?TONE: ?WNL ?  ?PROLAPSE: ?Grade 2 rectocele, Grade 1 cystocele ?  ?  ?  ?TODAY'S TREATMENT: ? ? ?TREATMENT 07/01/2021: ?-Pelvic floor strengthening: ? -seated 10x with breath coordination ?-regular stance 10x ?-wide stance 10x ?-staggered stance bil 10x ? ?-Mobility exercises: ? -cat/cow 2 x 10 with breath/pelvic floor coordination ? -Child's pose 10 breaths ? -open books 10x bil ?-bent knee fall out 10x bil ? ?-Strengthening: ? -clam shells 2 x 10 bil ? -bridge with ball squeeze 2 x 10 ? ?TREATMENT 06/18/2021   ?EVAL Treatment consisted of pelvic floor active range of motion training with breath coordination, the knack, double-voiding, and education on use of squatty potty and leaning back with bowel movements.  ?  ?  ?PATIENT EDUCATION:  ?Education details: HEP progressions and pelvic floor progressions to seated and standing positions.  ?Person educated: Patient ?Education method: Explanation, Demonstration, Tactile cues, Verbal cues, and Handouts ?Education comprehension: verbalized understanding ?  ?  ?HOME EXERCISE PROGRAM: ?[id number] ?  ?ASSESSMENT: ?  ?CLINICAL IMPRESSION: ?She will continue benefit from skilled PT intervention in order to address impairments, decrease urinary incontinence, and improve QOL.  ?  ?  ?OBJECTIVE IMPAIRMENTS decreased activity tolerance, decreased coordination, decreased endurance, decreased mobility, decreased strength, hypomobility, increased muscle spasms, impaired tone, and pain.  ?  ?ACTIVITY LIMITATIONS community activity.  ?  ?PERSONAL FACTORS 1 comorbidity: G5P5  are also affecting patient's functional outcome.  ?  ?  ?REHAB POTENTIAL: Good ?  ?CLINICAL DECISION MAKING: Stable/uncomplicated ?  ?EVALUATION COMPLEXITY: Low ?  ?  ?GOALS: ?Goals reviewed with patient? Yes ?  ?SHORT TERM GOALS: ?  ?STG Name Target Date Goal status  ?1  Pt will be independent with HEP.  ?Baseline:  07/16/2021 INITIAL  ?2 Pt will be independent with the knack, urge suppression technique, and double voiding in order to improve bladder habits and decrease urinary incontinence.  ?Baseline:  07/16/2021 INITIAL  ?3 Pt will be able to correctly perform diaphragmatic breathing and appropriate pressure management in order to prevent worsening vaginal wall laxity and improve pelvic floor  A/ROM.  ?Baseline: 07/16/2021 INITIAL  ?4        ?5        ?6        ?7        ?  ?LONG TERM GOALS:  ?  ?LTG Name Target Date Goal status  ?1 Pt will be independent with advanced HEP.  ?Baseline: 09/10/2021 INITIAL  ?2 Pt will be independent with use of squatty potty, relaxed toileting mechanics, and improved bowel movement techniques in order to increase ease of bowel movements and complete evacuation.  ?Baseline: 09/10/2021 INITIAL  ?3 Pt will report no leaks with laughing, coughing, sneezing in order to improve comfort with interpersonal relationships and community activities.  ?Baseline: 07/16/2021 INITIAL  ?4 Pt will demonstrate normal pelvic floor muscle tone and A/ROM, able to achieve 4/5 strength with contractions and 10 sec endurance, in order to provide appropriate lumbopelvic support in functional activities.  ?Baseline: 09/10/2021 INITIAL  ?5 Pt will report 0/10 pain with vaginal penetration in order to improve intimate relationship with partner.   ?Baseline: 09/10/2021 INITIAL  ?         ?         ?  ?PLAN: ?PT FREQUENCY: 1x/week ?  ?PT DURATION: 12 weeks ?  ?PLANNED INTERVENTIONS: Therapeutic exercises, Therapeutic activity, Neuromuscular re-education, Balance training, Gait training, Patient/Family education, Joint mobilization, and Dry Needling ?  ?PLAN FOR NEXT SESSION: Begin transversus abdominus training and core exercise progressions.  ? ? ? ?Heather Roberts, PT, DPT03/20/231:45 PM ? ? ?  ? ?

## 2021-07-16 ENCOUNTER — Ambulatory Visit: Payer: Medicaid Other

## 2021-07-16 DIAGNOSIS — N819 Female genital prolapse, unspecified: Secondary | ICD-10-CM | POA: Diagnosis not present

## 2021-07-16 DIAGNOSIS — N393 Stress incontinence (female) (male): Secondary | ICD-10-CM

## 2021-07-16 DIAGNOSIS — M6281 Muscle weakness (generalized): Secondary | ICD-10-CM

## 2021-07-16 DIAGNOSIS — R279 Unspecified lack of coordination: Secondary | ICD-10-CM | POA: Diagnosis not present

## 2021-07-16 DIAGNOSIS — M62838 Other muscle spasm: Secondary | ICD-10-CM

## 2021-07-16 NOTE — Therapy (Signed)
?OUTPATIENT PHYSICAL THERAPY TREATMENT NOTE ? ? ?Patient Name: Erica Walton ?MRN: [id number] ?DOB:Jul 02, 1995, 26 y.o., female ?Today's Date: 07/16/2021 ? ?PCP: [name], NP ?REFERRING PROVIDER: Caren Macadam,* ? ? PT End of Session - 07/16/21 1533   ? ? Visit Number 3   ? Date for PT Re-Evaluation 09/10/21   ? Authorization Type [employer]   ? Authorization Time Period 06/19/21-09/10/21   ? Authorization - Visit Number 3   ? Authorization - Number of Visits 12   ? PT Start Time 1533   ? PT Stop Time 1620   ? PT Time Calculation (min) 47 min   ? Activity Tolerance Patient tolerated treatment well   ? Behavior During Therapy [facility] for tasks assessed/performed   ? ?  ?  ? ?  ? ? ?Past Medical History:  ?Diagnosis Date  ? Asthma   ? Genital herpes   ? HIV (human immunodeficiency virus infection) ([place])   ? HIV positive ([place]) 06/16/2021  ? ?Past Surgical History:  ?Procedure Laterality Date  ? APPENDECTOMY    ? LAPAROSCOPIC APPENDECTOMY N/A 07/22/2019  ? Procedure: APPENDECTOMY LAPAROSCOPIC;  Surgeon: Herbert Pun, MD;  Location: [facility] ORS;  Service: General;  Laterality: N/A;  ? ?Patient Active Problem List  ? Diagnosis Date Noted  ? Mixed hyperlipidemia 06/23/2021  ? Family history of Wilson's disease 06/23/2021  ? Dyshidrotic eczema 06/16/2021  ? Encounter for initial prescription of vaginal ring hormonal contraceptive 01/27/2021  ? Low back pain 09/23/2020  ? 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 Herpes 10/06/2013  ? Asthma 11/17/2011  ? Human immunodeficiency virus (HIV) disease ([place]) 11/17/2011  ? ? ?REFERRING DIAG: N81.9 (ICD-10-CM) - Female genital prolapse, unspecified type ?N39.3 (ICD-10-CM) - Stress incontinence of urine ? ?THERAPY DIAG:  ?Female genital prolapse, unspecified type ? ?Stress incontinence of urine ? ?Muscle weakness (generalized) ? ?Unspecified lack of coordination ? ?Other muscle spasm ? ?PERTINENT HISTORY: G5P5 with minor tearing and chronic low back pain ? ?PRECAUTIONS:  NA ? ?SUBJECTIVE: Pt states that she had an episode yesterday when she bent down and belly pressed against legs and she emptied her bladder - she had been to the bathroom 20-30 minutes prior to that. Other than that incident, she feels like things are getting better. She is feeling like she is able to more completely empty bladder and is not always leaning forward to help with this.  ? ?PAIN:  ?Are you having pain? No ? ?                                                                                                                                                               ?  ?SUBJECTIVE STATEMENT FROM EVAL 06/18/2021: ?Pt states that she has had 5 vaginal deliveries and very  minor tearing from 2013-2021. She states that she started noticing bladder prolapse after her 3rd delivery. After her last delivery, she is noticing rectocele and cystocele. She is having irregular menstrual cycles. She is having urinary incontinence with laughing/coughing/sneezing and does not feel like she's emptying completely.  ?Fluid intake: Yes: She states not enough water, she is working on cutting out water. Feels like when she does drink water, it goes right  through her. Caffeine in sodas.    ?  ?BOWEL MOVEMENT ?Pain with bowel movement: No ?Type of bowel movement:Frequency 1x every 2-3 days depending on diet - she tends to alternate between constipation and diarrhea and Strain Yes ?Fully empty rectum: No ?Leakage: No ?Pads: No ?Fiber supplement: No ?  ?URINATION ?Pain with urination: No ?Fully empty bladder: No ?Stream: Strong ?Urgency: Yes: she will leak on the way to toilet ?Frequency: up to 5 hours - not typically waking at night ?Leakage: Urge to void, Walking to the bathroom, Coughing, Sneezing, and Laughing ?Pads: Yes: sometimes - depends on the day ?  ?INTERCOURSE ?Pain with intercourse: Initial Penetration and During Penetration ?Ability to have vaginal penetration:  Yes: - ?Types of stimulation: - ?Climax: Yes: some pain   ?Marinoff Scale  ?  ?PREGNANCY ?Vaginal deliveries 5 ?Tearing Yes: minor ?C-section deliveries 0 ?Currently pregnant No ? ?  ?OBJECTIVE from 06/18/21:  ?  ?COGNITION: ?           Overall cognitive status: Within functional limits for tasks assessed              ?            ?  ?POSTURE:  ?Forward head, rounded shoulders, posterior pelvic tilt ?  ?PALPATION: ?Internal Pelvic Floor Superficial muscles WNL, tenderness in deep pelvic floor muscles Rt>Lt described as achiness ?  ?External Perineal Exam WNL ?  ?GENERAL Mild tenderness on lower abdomen over bladder ?  ?  ?PELVIC MMT: ?  ?MMT   ?06/18/2021  ?Vaginal 2/5 strength with poor relaxation; 3 second endurance, 5 reps  ?Internal Anal Sphincter    ?External Anal Sphincter    ?Puborectalis    ?Diastasis Recti 1 finger width separation at umbilicus  ?(Blank rows = not tested) ?  ?  ?TONE: ?WNL ?  ?PROLAPSE: ?Grade 2 rectocele, Grade 1 cystocele ?  ?  ?  ?TODAY'S TREATMENT 07/16/21: ?Manual: ?Soft tissue mobilization: ?Scar tissue mobilization: ?Myofascial release: ?Spinal mobilization: ?Internal pelvic floor techniques: ?Dry needling: ?Neuromuscular re-education: ?Core retraining: 5 minutes core training with multi-modal cues - some difficulty with bearing down vs drawing ?Core facilitation: ?Supine march 2 x 10 ?Leg extensions 15x bil ?Horizontal abduction with core activation 2 x 10 green band ?Bil shoulder extension for core strengthening 2 x 10 green band ?Pallof press 2 x 10 bil green band ?Form correction: ?Pelvic floor contraction training: ?Down training: ?Exercises: ?Stretches/mobility: ?Strengthening: ?Modified side plank 2 x 20 sec bil ?Bridge march 10x ? ?Therapeutic activities: ?Functional strengthening activities: ?Squats 2 x 10 with breath coordination ?Self-care: ?Bowel massage ?Regular use of squatty potty ? ? ?TREATMENT 07/01/2021: ?-Pelvic floor strengthening: ? -seated 10x with breath coordination ?-regular stance 10x ?-wide stance 10x ?-staggered  stance bil 10x ? ?-Mobility exercises: ? -cat/cow 2 x 10 with breath/pelvic floor coordination ? -Child's pose 10 breaths ? -open books 10x bil ?-bent knee fall out 10x bil ? ?-Strengthening: ? -clam shells 2 x 10 bil ? -bridge with ball squeeze 2 x 10 ? ?TREATMENT 06/18/2021   ?EVAL  Treatment consisted of pelvic floor active range of motion training with breath coordination, the knack, double-voiding, and education on use of squatty potty and leaning back with bowel movements.  ?  ?  ?PATIENT EDUCATION:  ?Education details: HEP progressions and pelvic floor progressions to seated and standing positions.  ?Person educated: Patient ?Education method: Explanation, Demonstration, Tactile cues, Verbal cues, and Handouts ?Education comprehension: verbalized understanding ?  ?  ?HOME EXERCISE PROGRAM: ?[id number] ?  ?ASSESSMENT: ?  ?CLINICAL IMPRESSION: ?Pt is doing well with seeing progress, but did have big episode of urinary incontinence when there was increased abdominal pressure. With transversus abdominus training, she had tendency to bear down, which may be encouraging leaking with functional activities. She did well with retraining of core activation and was able to incorporate into other exercises. She was able to progress from table exercises up into standing positions with appropriate challenge and good form - demonstrates good understanding of breath coordination with effort which should help with pressure management. She will continue benefit from skilled PT intervention in order to address impairments, decrease urinary incontinence, and improve QOL.  ?  ?  ?OBJECTIVE IMPAIRMENTS decreased activity tolerance, decreased coordination, decreased endurance, decreased mobility, decreased strength, hypomobility, increased muscle spasms, impaired tone, and pain.  ?  ?ACTIVITY LIMITATIONS community activity.  ?  ?PERSONAL FACTORS 1 comorbidity: G5P5  are also affecting patient's functional outcome.  ?  ?  ?REHAB  POTENTIAL: Good ?  ?CLINICAL DECISION MAKING: Stable/uncomplicated ?  ?EVALUATION COMPLEXITY: Low ?  ?  ?GOALS: ?Goals reviewed with patient? Yes ?  ?SHORT TERM GOALS: ?  ?STG Name Target Date Goal status  ?1 Pt wi

## 2021-07-17 ENCOUNTER — Encounter (HOSPITAL_BASED_OUTPATIENT_CLINIC_OR_DEPARTMENT_OTHER): Payer: Self-pay | Admitting: Nurse Practitioner

## 2021-07-19 ENCOUNTER — Other Ambulatory Visit: Payer: Self-pay

## 2021-07-19 ENCOUNTER — Encounter (HOSPITAL_BASED_OUTPATIENT_CLINIC_OR_DEPARTMENT_OTHER): Payer: Self-pay | Admitting: *Deleted

## 2021-07-19 DIAGNOSIS — J189 Pneumonia, unspecified organism: Secondary | ICD-10-CM | POA: Diagnosis not present

## 2021-07-19 DIAGNOSIS — G43909 Migraine, unspecified, not intractable, without status migrainosus: Secondary | ICD-10-CM | POA: Insufficient documentation

## 2021-07-19 DIAGNOSIS — J168 Pneumonia due to other specified infectious organisms: Secondary | ICD-10-CM | POA: Diagnosis not present

## 2021-07-19 DIAGNOSIS — Z21 Asymptomatic human immunodeficiency virus [HIV] infection status: Secondary | ICD-10-CM | POA: Insufficient documentation

## 2021-07-19 DIAGNOSIS — R059 Cough, unspecified: Secondary | ICD-10-CM | POA: Diagnosis present

## 2021-07-19 DIAGNOSIS — Z20822 Contact with and (suspected) exposure to covid-19: Secondary | ICD-10-CM | POA: Insufficient documentation

## 2021-07-19 DIAGNOSIS — Z91040 Latex allergy status: Secondary | ICD-10-CM | POA: Insufficient documentation

## 2021-07-19 LAB — RESP PANEL BY RT-PCR (FLU A&B, COVID) ARPGX2
Influenza A by PCR: NEGATIVE
Influenza B by PCR: NEGATIVE
SARS Coronavirus 2 by RT PCR: NEGATIVE

## 2021-07-19 NOTE — ED Triage Notes (Signed)
Pt here for headache/migraine for 3 days as well as URI with cough and body aches ?

## 2021-07-20 ENCOUNTER — Emergency Department (HOSPITAL_BASED_OUTPATIENT_CLINIC_OR_DEPARTMENT_OTHER): Payer: Medicaid Other | Admitting: Radiology

## 2021-07-20 ENCOUNTER — Emergency Department (HOSPITAL_BASED_OUTPATIENT_CLINIC_OR_DEPARTMENT_OTHER)
Admission: EM | Admit: 2021-07-20 | Discharge: 2021-07-20 | Disposition: A | Discharge status: Home / Self Care | Payer: Medicaid Other | Attending: Emergency Medicine | Admitting: Emergency Medicine

## 2021-07-20 DIAGNOSIS — R059 Cough, unspecified: Secondary | ICD-10-CM | POA: Diagnosis not present

## 2021-07-20 DIAGNOSIS — J189 Pneumonia, unspecified organism: Secondary | ICD-10-CM

## 2021-07-20 HISTORY — DX: Lipoprotein deficiency: E78.6

## 2021-07-20 LAB — BASIC METABOLIC PANEL
Anion gap: 10 (ref 5–15)
BUN: 14 mg/dL (ref 6–20)
CO2: 24 mmol/L (ref 22–32)
Calcium: 9.6 mg/dL (ref 8.9–10.3)
Chloride: 106 mmol/L (ref 98–111)
Creatinine, Ser: 0.86 mg/dL (ref 0.44–1.00)
GFR, Estimated: >60 mL/min (ref >60)
Glucose, Bld: 107 mg/dL — ABNORMAL HIGH (ref 70–99)
Potassium: 3.6 mmol/L (ref 3.5–5.1)
Sodium: 140 mmol/L (ref 135–145)

## 2021-07-20 LAB — CBC WITH DIFFERENTIAL/PLATELET
Abs Immature Granulocytes: 0.01 10*3/uL (ref 0.00–0.07)
Basophils Absolute: 0 10*3/uL (ref 0.0–0.1)
Basophils Relative: 0 %
Eosinophils Absolute: 0.3 10*3/uL (ref 0.0–0.5)
Eosinophils Relative: 4 %
HCT: 42.2 % (ref 36.0–46.0)
Hemoglobin: 13.7 g/dL (ref 12.0–15.0)
Immature Granulocytes: 0 %
Lymphocytes Relative: 33 %
Lymphs Abs: 2.3 10*3/uL (ref 0.7–4.0)
MCH: 27.5 pg (ref 26.0–34.0)
MCHC: 32.5 g/dL (ref 30.0–36.0)
MCV: 84.6 fL (ref 80.0–100.0)
Monocytes Absolute: 0.6 10*3/uL (ref 0.1–1.0)
Monocytes Relative: 8 %
Neutro Abs: 3.7 10*3/uL (ref 1.7–7.7)
Neutrophils Relative %: 55 %
Platelets: 307 10*3/uL (ref 150–400)
RBC: 4.99 MIL/uL (ref 3.87–5.11)
RDW: 13.2 % (ref 11.5–15.5)
WBC: 6.9 10*3/uL (ref 4.0–10.5)
nRBC: 0 % (ref 0.0–0.2)

## 2021-07-20 MED ORDER — CEFDINIR 300 MG PO CAPS: 300.0000 mg | ORAL_CAPSULE | Freq: Two times a day (BID) | ORAL | 0 refills | Status: DC

## 2021-07-20 MED ORDER — KETOROLAC TROMETHAMINE 30 MG/ML IJ SOLN
15.0000 mg | Freq: Once | INTRAMUSCULAR | Status: AC
Start: 2021-07-20 — End: 2021-07-20
  Administered 2021-07-20: 15 mg via INTRAVENOUS
  Filled 2021-07-20: qty 1

## 2021-07-20 MED ORDER — METOCLOPRAMIDE HCL 5 MG/ML IJ SOLN
10.0000 mg | Freq: Once | INTRAMUSCULAR | Status: AC
  Administered 2021-07-20: 10 mg via INTRAVENOUS
  Filled 2021-07-20: qty 2

## 2021-07-20 MED ORDER — SODIUM CHLORIDE 0.9 % IV BOLUS
1000.0000 mL | Freq: Once | INTRAVENOUS | Status: AC
Start: 2021-07-20 — End: 2021-07-20
  Administered 2021-07-20: 1000 mL via INTRAVENOUS

## 2021-07-20 MED ORDER — AZITHROMYCIN 250 MG PO TABS: 250.0000 mg | ORAL_TABLET | Freq: Every day | ORAL | 0 refills | Status: DC

## 2021-07-20 MED ORDER — AZITHROMYCIN 250 MG PO TABS
500.0000 mg | ORAL_TABLET | Freq: Once | ORAL | Status: AC
Start: 2021-07-20 — End: 2021-07-20
  Administered 2021-07-20: 500 mg via ORAL
  Filled 2021-07-20: qty 2

## 2021-07-20 MED ORDER — SODIUM CHLORIDE 0.9 % IV SOLN
1.0000 g | Freq: Once | INTRAVENOUS | Status: AC
  Administered 2021-07-20: 1 g via INTRAVENOUS
  Filled 2021-07-20: qty 10

## 2021-07-20 MED ORDER — DIPHENHYDRAMINE HCL 50 MG/ML IJ SOLN
25.0000 mg | Freq: Once | INTRAMUSCULAR | Status: AC
Start: 2021-07-20 — End: 2021-07-20
  Administered 2021-07-20: 25 mg via INTRAVENOUS
  Filled 2021-07-20: qty 1

## 2021-07-20 NOTE — ED Provider Notes (Signed)
?[facility] EMERGENCY DEPT ?Provider Note ? ? ?CSN: [id number] ?Arrival date & time: 07/19/21  2114 ? ?  ? ?History ? ?Chief Complaint  ?Patient presents with  ? Migraine  ? ? ?Erica Walton is a 26 y.o. female. ? ?Patient presents to the emergency department for evaluation of headache, cough, congestion.  Patient reports that she has had a headache for 4 days.  She has tried Tylenol and ibuprofen without relief.  For the last 2 days she has developed cough, chest congestion.  She reports last time she felt like this she was exposed to RSV.  Patient does have a history of HIV, reports that her viral load is undetectable currently.  She has been compliant with her medications. ? ? ?  ? ?Home Medications ?Prior to Admission medications   ?Medication Sig Start Date End Date Taking? Authorizing Provider  ?azithromycin (ZITHROMAX) 250 MG tablet Take 1 tablet (250 mg total) by mouth daily. 07/20/21  Yes Sofi Bryars, Canary Brim, MD  ?cefdinir (OMNICEF) 300 MG capsule Take 1 capsule (300 mg total) by mouth 2 (two) times daily. 07/20/21  Yes Gorman Safi, Canary Brim, MD  ?albuterol (VENTOLIN HFA) 108 (90 Base) MCG/ACT inhaler Inhale 2 puffs into the lungs every 6 (six) hours as needed for wheezing or shortness of breath. 09/23/20   Blanchard Kelch, NP  ?atorvastatin (LIPITOR) 20 MG tablet Take 1 tablet (20 mg total) by mouth daily. ?Patient not taking: Reported on 06/25/2021 06/23/21   Tollie Eth, NP  ?dolutegravir (TIVICAY) 50 MG tablet Take 1 tablet (50 mg total) by mouth every evening. 06/25/21   Blanchard Kelch, NP  ?emtricitabine-tenofovir AF (DESCOVY) 200-25 MG tablet Take 1 tablet by mouth every evening. 06/25/21   Blanchard Kelch, NP  ?etonogestrel-ethinyl estradiol (NUVARING) 0.12-0.015 MG/24HR vaginal ring Insert vaginally and leave in place for 3 consecutive weeks, then remove for 1 week. 06/16/21   Tollie Eth, NP  ?fluconazole (DIFLUCAN) 150 MG tablet Take one tablet by mouth today and take second  tablet by mouth in 3 days. If symptoms do not go away, refill and repeat. For rash 06/16/21   Early, Sung Amabile, NP  ?mometasone (ELOCON) 0.1 % cream Apply thin layer to rash twice a day for 7 days. 06/16/21   Tollie Eth, NP  ?Prenatal Vit-Fe Phos-FA-Omega (VITAFOL GUMMIES) 3.33-0.333-34.8 MG CHEW Chew 3 tablets by mouth daily. ?Patient not taking: Reported on 06/25/2021 04/02/21   Tereso Newcomer, MD  ?Vitamin D, Ergocalciferol, (DRISDOL) 1.25 MG (50000 UNIT) CAPS capsule Take 1 capsule (50,000 Units total) by mouth every 7 (seven) days. Take for 12 total doses(weeks) than can transition to 1000 units OTC supplement daily ?Patient not taking: Reported on 06/25/2021 06/17/21   Tollie Eth, NP  ?   ? ?Allergies    ?Latex, Norco [hydrocodone-acetaminophen], and Penicillins   ? ?Review of Systems   ?Review of Systems  ?Respiratory:  Positive for cough.   ?Neurological:  Positive for headaches.  ? ?Physical Exam ?Updated Vital Signs ?BP 106/82   Pulse 76   Temp 99.1 ?F (37.3 ?C)   Resp 18   Wt 71.2 kg   SpO2 99%   BMI 32.81 kg/m?  ?Physical Exam ?Vitals and nursing note reviewed.  ?Constitutional:   ?   General: She is not in acute distress. ?   Appearance: She is well-developed.  ?HENT:  ?   Head: Normocephalic and atraumatic.  ?   Mouth/Throat:  ?   Mouth: Mucous  membranes are moist.  ?Eyes:  ?   General: Vision grossly intact. Gaze aligned appropriately.  ?   Extraocular Movements: Extraocular movements intact.  ?   Conjunctiva/sclera: Conjunctivae normal.  ?Cardiovascular:  ?   Rate and Rhythm: Normal rate and regular rhythm.  ?   Pulses: Normal pulses.  ?   Heart sounds: Normal heart sounds, S1 normal and S2 normal. No murmur heard. ?  No friction rub. No gallop.  ?Pulmonary:  ?   Effort: Pulmonary effort is normal. No respiratory distress.  ?   Breath sounds: Normal breath sounds.  ?Abdominal:  ?   General: Bowel sounds are normal.  ?   Palpations: Abdomen is soft.  ?   Tenderness: There is no abdominal  tenderness. There is no guarding or rebound.  ?   Hernia: No hernia is present.  ?Musculoskeletal:     ?   General: No swelling.  ?   Cervical back: Full passive range of motion without pain, normal range of motion and neck supple. No spinous process tenderness or muscular tenderness. Normal range of motion.  ?   Right lower leg: No edema.  ?   Left lower leg: No edema.  ?Skin: ?   General: Skin is warm and dry.  ?   Capillary Refill: Capillary refill takes less than 2 seconds.  ?   Findings: No ecchymosis, erythema, rash or wound.  ?Neurological:  ?   General: No focal deficit present.  ?   Mental Status: She is alert and oriented to person, place, and time.  ?   GCS: GCS eye subscore is 4. GCS verbal subscore is 5. GCS motor subscore is 6.  ?   Cranial Nerves: Cranial nerves 2-12 are intact.  ?   Sensory: Sensation is intact.  ?   Motor: Motor function is intact.  ?   Coordination: Coordination is intact.  ?Psychiatric:     ?   Attention and Perception: Attention normal.     ?   Mood and Affect: Mood normal.     ?   Speech: Speech normal.     ?   Behavior: Behavior normal.  ? ? ?ED Results / Procedures / Treatments   ?Labs ?(all labs ordered are listed, but only abnormal results are displayed) ?Labs Reviewed  ?BASIC METABOLIC PANEL - Abnormal; Notable for the following components:  ?    Result Value  ? Glucose, Bld 107 (*)   ? All other components within normal limits  ?RESP PANEL BY RT-PCR (FLU A&B, COVID) [id number]  ?CBC WITH DIFFERENTIAL/PLATELET  ? ? ?EKG ?None ? ?Radiology ?DG Chest 2 View ? ?Result Date: 07/20/2021 ?CLINICAL DATA:  Cough EXAM: CHEST - 2 VIEW COMPARISON:  07/25/2014 FINDINGS: Sparse focal pulmonary infiltrate is seen within the a left lung base, possibly infectious in the appropriate clinical setting. The lungs are otherwise clear. No pneumothorax or pleural effusion. Cardiac size within normal limits. Pulmonary vascularity is normal. No acute bone abnormality. IMPRESSION: Sparse left basilar  focal pneumonic infiltrate. Electronically Signed   By: Helyn NumbersAshesh  Parikh M.D.   On: 07/20/2021 02:08   ? ?Procedures ?Procedures  ? ? ?Medications Ordered in ED ?Medications  ?cefTRIAXone (ROCEPHIN) 1 g in sodium chloride 0.9 % 100 mL IVPB (has no administration in time range)  ?azithromycin (ZITHROMAX) tablet 500 mg (has no administration in time range)  ?sodium chloride 0.9 % bolus 1,000 mL (1,000 mLs Intravenous New Bag/Given 07/20/21 0143)  ?ketorolac (TORADOL) 30 MG/ML injection 15 mg (  15 mg Intravenous Given 07/20/21 0143)  ?metoCLOPramide (REGLAN) injection 10 mg (10 mg Intravenous Given 07/20/21 0143)  ?diphenhydrAMINE (BENADRYL) injection 25 mg (25 mg Intravenous Given 07/20/21 0143)  ? ? ?ED Course/ Medical Decision Making/ A&P ?  ?                        ?Medical Decision Making ?Amount and/or Complexity of Data Reviewed ?Labs: ordered. ?Radiology: ordered. ? ?Risk ?Prescription drug management. ? ? ?Patient presents to the emergency department for evaluation of headache and URI symptoms.  Patient has had a headache for 4 days.  Pain is frontal in nature currently.  Pain was not sudden in onset, no red flags for subarachnoid hemorrhage.  Has a normal neurologic exam.  No meningismus.  Doubt encephalitis/meningitis. ? ?Patient does have a history of HIV infection.  She is compliant with her HIV medications and viral load is undetectable. ? ?Patient negative for COVID, influenza.  Chest x-ray, however, does show a sparse infiltrate, possibly early pneumonia.  She does have cough but no hypoxia or respiratory distress.  We will treat for community-acquired pneumonia, have patient follow-up with infectious disease physician. ? ? ? ? ? ? ? ?Final Clinical Impression(s) / ED Diagnoses ?Final diagnoses:  ?Community acquired pneumonia of right lower lobe of lung  ? ? ?Rx / DC Orders ?ED Discharge Orders   ? ?      Ordered  ?  cefdinir (OMNICEF) 300 MG capsule  2 times daily       ? 07/20/21 0235  ?  azithromycin  (ZITHROMAX) 250 MG tablet  Daily       ? 07/20/21 0235  ? ?  ?  ? ?  ? ? ?  ?Gilda Crease, MD ?07/20/21 0235 ? ?

## 2021-07-23 ENCOUNTER — Ambulatory Visit: Payer: Medicaid Other | Attending: Family Medicine

## 2021-07-23 DIAGNOSIS — M6281 Muscle weakness (generalized): Secondary | ICD-10-CM | POA: Insufficient documentation

## 2021-07-23 DIAGNOSIS — N819 Female genital prolapse, unspecified: Secondary | ICD-10-CM | POA: Diagnosis not present

## 2021-07-23 DIAGNOSIS — M62838 Other muscle spasm: Secondary | ICD-10-CM | POA: Diagnosis not present

## 2021-07-23 DIAGNOSIS — R279 Unspecified lack of coordination: Secondary | ICD-10-CM | POA: Insufficient documentation

## 2021-07-23 DIAGNOSIS — N393 Stress incontinence (female) (male): Secondary | ICD-10-CM | POA: Insufficient documentation

## 2021-07-23 NOTE — Therapy (Addendum)
?OUTPATIENT PHYSICAL THERAPY TREATMENT NOTE ? ? ?Patient Name: Erica Walton ?MRN: [id number] ?DOB:[birth year], 26 y.o., female ?Today's Date: 07/23/2021 ? ?PCP: Tollie Eth, NP ?REFERRING PROVIDER: Tollie Eth, NP ? ? PT End of Session - 07/23/21 1541   ? ? Visit Number 4   ? Date for PT Re-Evaluation 09/10/21   ? Authorization Type [employer]   ? Authorization Time Period 06/19/21-09/10/21   ? Authorization - Visit Number 4   ? Authorization - Number of Visits 12   ? PT Start Time 1540   ? PT Stop Time 1610   ? PT Time Calculation (min) 30 min   ? Activity Tolerance Patient tolerated treatment well   ? Behavior During Therapy [employer] for tasks assessed/performed   ? ?  ?  ? ?  ? ? ?Past Medical History:  ?Diagnosis Date  ? Asthma   ? Genital herpes   ? HIV (human immunodeficiency virus infection) (HCC)   ? HIV positive (HCC) 06/16/2021  ? Hypolipidemia   ? ?Past Surgical History:  ?Procedure Laterality Date  ? APPENDECTOMY    ? LAPAROSCOPIC APPENDECTOMY N/A 07/22/2019  ? Procedure: APPENDECTOMY LAPAROSCOPIC;  Surgeon: Carolan Shiver, MD;  Location: [facility];  Service: General;  Laterality: N/A;  ? ?Patient Active Problem List  ? Diagnosis Date Noted  ? Mixed hyperlipidemia 06/23/2021  ? Family history of Wilson's disease 06/23/2021  ? Dyshidrotic eczema 06/16/2021  ? Encounter for initial prescription of vaginal ring hormonal contraceptive 01/27/2021  ? Low back pain 09/23/2020  ? 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 Herpes 10/06/2013  ? Asthma 11/17/2011  ? Human immunodeficiency virus (HIV) disease (HCC) 11/17/2011  ? ? ?REFERRING DIAG: N81.9 (ICD-10-CM) - Female genital prolapse, unspecified type ?N39.3 (ICD-10-CM) - Stress incontinence of urine ? ?THERAPY DIAG:  ?Female genital prolapse, unspecified type ? ?Stress incontinence of urine ? ?Muscle weakness (generalized) ? ?Unspecified lack of coordination ? ?Other muscle spasm ? ?PERTINENT HISTORY: G5P5 with minor tearing and chronic low back  pain ? ?PRECAUTIONS: NA ? ?SUBJECTIVE: Pt states that she has been doing very well with bladder retraining and is having no issues making it to every 3 hours. She states that she has had pneumonia over the last week and only had one small episode of urinary leaking with all the coughing she is doing. ? ?PAIN:  ?Are you having pain? No ? ?                                                                                                                                                               ?  ?SUBJECTIVE STATEMENT FROM EVAL 06/18/2021: ?Pt states that she has had 5 vaginal deliveries and very minor tearing from 2013-2021. She states that she started noticing bladder prolapse after  her 3rd delivery. After her last delivery, she is noticing rectocele and cystocele. She is having irregular menstrual cycles. She is having urinary incontinence with laughing/coughing/sneezing and does not feel like she's emptying completely.  ?Fluid intake: Yes: She states not enough water, she is working on cutting out water. Feels like when she does drink water, it goes right  through her. Caffeine in sodas.    ?  ?BOWEL MOVEMENT ?Pain with bowel movement: No ?Type of bowel movement:Frequency 1x every 2-3 days depending on diet - she tends to alternate between constipation and diarrhea and Strain Yes ?Fully empty rectum: No ?Leakage: No ?Pads: No ?Fiber supplement: No ?  ?URINATION ?Pain with urination: No ?Fully empty bladder: No ?Stream: Strong ?Urgency: Yes: she will leak on the way to toilet ?Frequency: up to 5 hours - not typically waking at night ?Leakage: Urge to void, Walking to the bathroom, Coughing, Sneezing, and Laughing ?Pads: Yes: sometimes - depends on the day ?  ?INTERCOURSE ?Pain with intercourse: Initial Penetration and During Penetration ?Ability to have vaginal penetration:  Yes: - ?Types of stimulation: - ?Climax: Yes: some pain  ?Marinoff Scale  ?  ?PREGNANCY ?Vaginal deliveries 5 ?Tearing Yes: minor ?C-section  deliveries 0 ?Currently pregnant No ? ?  ?OBJECTIVE from 06/18/21:  ?  ?COGNITION: ?           Overall cognitive status: Within functional limits for tasks assessed              ?            ?  ?POSTURE:  ?Forward head, rounded shoulders, posterior pelvic tilt ?  ?PALPATION: ?Internal Pelvic Floor Superficial muscles WNL, tenderness in deep pelvic floor muscles Rt>Lt described as achiness ?  ?External Perineal Exam WNL ?  ?GENERAL Mild tenderness on lower abdomen over bladder ?  ?  ?PELVIC MMT: ?  ?MMT   ?06/18/2021  ?Vaginal 2/5 strength with poor relaxation; 3 second endurance, 5 reps  ?Internal Anal Sphincter    ?External Anal Sphincter    ?Puborectalis    ?Diastasis Recti 1 finger width separation at umbilicus  ?(Blank rows = not tested) ?  ?  ?TONE: ?WNL ?  ?PROLAPSE: ?Grade 2 rectocele, Grade 1 cystocele ?  ?  ?  ?TODAY'S TREATMENT 07/23/21: ?Manual: ?Soft tissue mobilization: ?Scar tissue mobilization: ?Myofascial release: ?Spinal mobilization: ?Internal pelvic floor techniques: ?Dry needling: ?Neuromuscular re-education: ?Core retraining:  ?Core facilitation: ?Leg extensions 10x bil ?Dead bug 2 x 10 ?Bird-dog 2 x 10 ?Form correction: ?Pelvic floor contraction training: ?Down training: ?Exercises: ?Stretches/mobility: ?Strengthening: ?Therapeutic activities: ?Functional strengthening activities: ?Squats 2 x 10 ?Reverse lunge 10x bil ?Lateral lunge 10x bil ?Self-care: ? ? ? ?TREATMENT 07/16/21: ?Manual: ?Soft tissue mobilization: ?Scar tissue mobilization: ?Myofascial release: ?Spinal mobilization: ?Internal pelvic floor techniques: ?Dry needling: ?Neuromuscular re-education: ?Core retraining: 5 minutes core training with multi-modal cues - some difficulty with bearing down vs drawing ?Core facilitation: ?Supine march 2 x 10 ?Leg extensions 15x bil ?Horizontal abduction with core activation 2 x 10 green band ?Bil shoulder extension for core strengthening 2 x 10 green band ?Pallof press 2 x 10 bil green band ?Form  correction: ?Pelvic floor contraction training: ?Down training: ?Exercises: ?Stretches/mobility: ?Strengthening: ?Modified side plank 2 x 20 sec bil ?Bridge march 10x ? ?Therapeutic activities: ?Functional strengthening activities: ?Squats 2 x 10 with breath coordination ?Self-care: ?Bowel massage ?Regular use of squatty potty ? ? ?TREATMENT 07/01/2021: ?-Pelvic floor strengthening: ? -seated 10x with breath coordination ?-regular stance 10x ?-  wide stance 10x ?-staggered stance bil 10x ? ?-Mobility exercises: ? -cat/cow 2 x 10 with breath/pelvic floor coordination ? -Child's pose 10 breaths ? -open books 10x bil ?-bent knee fall out 10x bil ? ?-Strengthening: ? -clam shells 2 x 10 bil ? -bridge with ball squeeze 2 x 10 ?  ?  ?PATIENT EDUCATION:  ?Education details: HEP progressions  ?Person educated: Patient ?Education method: Explanation, Demonstration, Tactile cues, Verbal cues, and Handouts ?Education comprehension: verbalized understanding ?  ?  ?HOME EXERCISE PROGRAM: ?[id number] ?  ?ASSESSMENT: ?  ?CLINICAL IMPRESSION: ?Pt overall making excellent progress with significant improvement in leaking and urgency; even with being sick, she only had one leak. She did well with all exercise progressions in standing and we revisited core facilitation exercises and was able to progress to include dead bug. Good challenge with all exercises while maintaining appropriate form. She will continue benefit from skilled PT intervention in order to address impairments, decrease urinary incontinence, and improve QOL.  ?  ?  ?OBJECTIVE IMPAIRMENTS decreased activity tolerance, decreased coordination, decreased endurance, decreased mobility, decreased strength, hypomobility, increased muscle spasms, impaired tone, and pain.  ?  ?ACTIVITY LIMITATIONS community activity.  ?  ?PERSONAL FACTORS 1 comorbidity: G5P5  are also affecting patient's functional outcome.  ?  ?  ?REHAB POTENTIAL: Good ?  ?CLINICAL DECISION MAKING:  Stable/uncomplicated ?  ?EVALUATION COMPLEXITY: Low ?  ?  ?GOALS: ?Goals reviewed with patient? Yes ?  ?SHORT TERM GOALS: ?  ?STG Name Target Date Goal status  ?1 Pt will be independent with HEP.  ?Baseline:  07/16/2021 INITIAL  ?

## 2021-07-28 ENCOUNTER — Ambulatory Visit (HOSPITAL_COMMUNITY)
Admission: EM | Admit: 2021-07-28 | Discharge: 2021-07-28 | Disposition: A | Discharge status: Home / Self Care | Payer: Medicaid Other | Attending: Family Medicine | Admitting: Family Medicine

## 2021-07-28 ENCOUNTER — Encounter (HOSPITAL_COMMUNITY): Payer: Self-pay | Admitting: Emergency Medicine

## 2021-07-28 DIAGNOSIS — J189 Pneumonia, unspecified organism: Secondary | ICD-10-CM | POA: Diagnosis not present

## 2021-07-28 DIAGNOSIS — B20 Human immunodeficiency virus [HIV] disease: Secondary | ICD-10-CM

## 2021-07-28 DIAGNOSIS — D849 Immunodeficiency, unspecified: Secondary | ICD-10-CM

## 2021-07-28 MED ORDER — PROMETHAZINE-DM 6.25-15 MG/5ML PO SYRP: 5.0000 mL | ORAL_SOLUTION | Freq: Three times a day (TID) | ORAL | 0 refills | Status: DC | PRN

## 2021-07-28 MED ORDER — ALBUTEROL SULFATE HFA 108 (90 BASE) MCG/ACT IN AERS: 2.0000 | INHALATION_SPRAY | Freq: Four times a day (QID) | RESPIRATORY_TRACT | 2 refills | Status: DC | PRN

## 2021-07-28 MED ORDER — AEROCHAMBER PLUS FLO-VU LARGE MISC
Status: AC
Start: 2021-07-28 — End: ?
  Filled 2021-07-28: qty 1

## 2021-07-28 MED ORDER — AEROCHAMBER PLUS FLO-VU MEDIUM MISC
1.0000 | Freq: Once | Status: AC
Start: 2021-07-28 — End: 2021-07-28
  Administered 2021-07-28: 1

## 2021-07-28 MED ORDER — ALBUTEROL SULFATE HFA 108 (90 BASE) MCG/ACT IN AERS
INHALATION_SPRAY | RESPIRATORY_TRACT | Status: AC
  Filled 2021-07-28: qty 6.7

## 2021-07-28 MED ORDER — ALBUTEROL SULFATE HFA 108 (90 BASE) MCG/ACT IN AERS
2.0000 | INHALATION_SPRAY | Freq: Once | RESPIRATORY_TRACT | Status: AC
  Administered 2021-07-28: 2 via RESPIRATORY_TRACT

## 2021-07-28 MED ORDER — GUAIFENESIN ER 1200 MG PO TB12: 1200.0000 mg | ORAL_TABLET | Freq: Two times a day (BID) | ORAL | 0 refills | Status: DC | PRN

## 2021-07-28 NOTE — ED Provider Notes (Signed)
?[facility] ? ? ? ?CSN: [id number] ?Arrival date & time: 07/28/21  1730 ? ? ?  ? ?History   ?Chief Complaint ?Chief Complaint  ?Patient presents with  ? Cough  ? ? ?HPI ?Erica Walton is a 26 y.o. female.  ? ?HPI ?Patient with a medical history significant for asthma, HIV, presents today with persistent cough which is productive following being diagnosed with pneumonia 1 week ago.  She is completing the remaining doses of her antibiotic and continues to experience a cough which is occasionally productive. She denies any fever, shortness of breath, or chest tightness. She was treated dually Azithromycin and cefdinir.  ?Past Medical History:  ?Diagnosis Date  ? Asthma   ? Genital herpes   ? HIV (human immunodeficiency virus infection) (HCC)   ? HIV positive (HCC) 06/16/2021  ? Hypolipidemia   ? ? ?Patient Active Problem List  ? Diagnosis Date Noted  ? Mixed hyperlipidemia 06/23/2021  ? Family history of Wilson's disease 06/23/2021  ? Dyshidrotic eczema 06/16/2021  ? Encounter for initial prescription of vaginal ring hormonal contraceptive 01/27/2021  ? Low back pain 09/23/2020  ? 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 Herpes 10/06/2013  ? Asthma 11/17/2011  ? Human immunodeficiency virus (HIV) disease (HCC) 11/17/2011  ? ? ?Past Surgical History:  ?Procedure Laterality Date  ? APPENDECTOMY    ? LAPAROSCOPIC APPENDECTOMY N/A 07/22/2019  ? Procedure: APPENDECTOMY LAPAROSCOPIC;  Surgeon: Carolan Shiverintron-Diaz, Edgardo, MD;  Location: [facility];  Service: General;  Laterality: N/A;  ? ? ?OB History   ? ? Gravida  ?5  ? Para  ?5  ? Term  ?5  ? Preterm  ?   ? AB  ?   ? Living  ?5  ?  ? ? SAB  ?   ? IAB  ?   ? Ectopic  ?   ? Multiple  ?0  ? Live Births  ?5  ?   ?  ?  ? ? ? ?Home Medications   ? ?Prior to Admission medications   ?Medication Sig Start Date End Date Taking? Authorizing Provider  ?atorvastatin (LIPITOR) 20 MG tablet Take 1 tablet (20 mg total) by mouth daily. 06/23/21  Yes Early, Sung AmabileSara E, NP  ?cefdinir  (OMNICEF) 300 MG capsule Take 1 capsule (300 mg total) by mouth 2 (two) times daily. 07/20/21  Yes Pollina, Canary Brimhristopher J, MD  ?dolutegravir (TIVICAY) 50 MG tablet Take 1 tablet (50 mg total) by mouth every evening. 06/25/21  Yes Blanchard Kelchixon, Stephanie N, NP  ?emtricitabine-tenofovir AF (DESCOVY) 200-25 MG tablet Take 1 tablet by mouth every evening. 06/25/21  Yes Blanchard Kelchixon, Stephanie N, NP  ?etonogestrel-ethinyl estradiol (NUVARING) 0.12-0.015 MG/24HR vaginal ring Insert vaginally and leave in place for 3 consecutive weeks, then remove for 1 week. 06/16/21  Yes Early, Sung AmabileSara E, NP  ?albuterol (VENTOLIN HFA) 108 (90 Base) MCG/ACT inhaler Inhale 2 puffs into the lungs every 6 (six) hours as needed for wheezing or shortness of breath. 09/23/20   Blanchard Kelchixon, Stephanie N, NP  ?azithromycin (ZITHROMAX) 250 MG tablet Take 1 tablet (250 mg total) by mouth daily. 07/20/21   Gilda CreasePollina, Christopher J, MD  ?fluconazole (DIFLUCAN) 150 MG tablet Take one tablet by mouth today and take second tablet by mouth in 3 days. If symptoms do not go away, refill and repeat. For rash 06/16/21   Early, Sung AmabileSara E, NP  ?mometasone (ELOCON) 0.1 % cream Apply thin layer to rash twice a day for 7 days. 06/16/21   Tollie EthEarly, Sara E,  NP  ?Prenatal Vit-Fe Phos-FA-Omega (VITAFOL GUMMIES) 3.33-0.333-34.8 MG CHEW Chew 3 tablets by mouth daily. ?Patient not taking: Reported on 06/25/2021 04/02/21   Tereso Newcomer, MD  ?Vitamin D, Ergocalciferol, (DRISDOL) 1.25 MG (50000 UNIT) CAPS capsule Take 1 capsule (50,000 Units total) by mouth every 7 (seven) days. Take for 12 total doses(weeks) than can transition to 1000 units OTC supplement daily ?Patient not taking: Reported on 06/25/2021 06/17/21   Tollie Eth, NP  ? ? ?Family History ?Family History  ?Problem Relation Age of Onset  ? Cancer Father   ? Breast cancer Other   ? ? ?Social History ?Social History  ? ?Tobacco Use  ? Smoking status: Never  ? Smokeless tobacco: Never  ?Substance Use Topics  ? Alcohol use: Not Currently  ?  Comment: 2018   ? Drug use: Never  ? ? ? ?Allergies   ?Latex, Norco [hydrocodone-acetaminophen], and Penicillins ? ? ?Review of Systems ?Review of Systems ?Pertinent negatives listed in HPI  ? ?Physical Exam ?Triage Vital Signs ?ED Triage Vitals  ?Enc Vitals Group  ?   BP 07/28/21 1915 127/69  ?   Pulse Rate 07/28/21 1915 (!) 106  ?   Resp 07/28/21 1915 18  ?   Temp 07/28/21 1915 99.2 ?F (37.3 ?C)  ?   Temp Source 07/28/21 1915 Oral  ?   SpO2 07/28/21 1915 98 %  ?   Weight --   ?   Height --   ?   Head Circumference --   ?   Peak Flow --   ?   Pain Score 07/28/21 1920 8  ?   Pain Loc --   ?   Pain Edu? --   ?   Excl. in GC? --   ? ?No data found. ? ?Updated Vital Signs ?BP 127/69 (BP Location: Right Arm)   Pulse (!) 106   Temp 99.2 ?F (37.3 ?C) (Oral)   Resp 18   LMP 07/25/2021   SpO2 98%   Breastfeeding No  ? ?Visual Acuity ?Right Eye Distance:   ?Left Eye Distance:   ?Bilateral Distance:   ? ?Right Eye Near:   ?Left Eye Near:    ?Bilateral Near:    ? ?Physical Exam ?Constitutional:   ?   Appearance: Normal appearance.  ?HENT:  ?   Head: Normocephalic and atraumatic.  ?   Nose: Congestion and rhinorrhea present.  ?Eyes:  ?   Extraocular Movements: Extraocular movements intact.  ?   Pupils: Pupils are equal, round, and reactive to light.  ?Cardiovascular:  ?   Rate and Rhythm: Normal rate and regular rhythm.  ?Pulmonary:  ?   Effort: Pulmonary effort is normal.  ?   Breath sounds: Normal breath sounds.  ?Musculoskeletal:     ?   General: Normal range of motion.  ?   Cervical back: Normal range of motion and neck supple.  ?Skin: ?   General: Skin is warm and dry.  ?   Capillary Refill: Capillary refill takes less than 2 seconds.  ?Neurological:  ?   General: No focal deficit present.  ?   Mental Status: She is alert.  ?Psychiatric:     ?   Mood and Affect: Mood normal.     ?   Behavior: Behavior normal.     ?   Thought Content: Thought content normal.     ?   Judgment: Judgment normal.  ? ? ? ?UC Treatments / Results   ?Labs ?(all  labs ordered are listed, but only abnormal results are displayed) ?Labs Reviewed - No data to display ? ?EKG ? ? ?Radiology ?No results found. ? ?Procedures ?Procedures (including critical care time) ? ?Medications Ordered in UC ?Medications - No data to display ? ?Initial Impression / Assessment and Plan / UC Course  ?I have reviewed the triage vital signs and the nursing notes. ? ?Pertinent labs & imaging results that were available during my care of the patient were reviewed by me and considered in my medical decision making (see chart for details). ? ?  ?Pneumonia patient advised that she will likely continue to have a cough for the next few weeks as her pneumonia resolves.  No repeat chest x-ray is warranted given diagnosis is less than 4 weeks.  On exam patient's lung sounds are appropriate.  Therefore we will treat symptoms of cough with Promethazine DM, guaifenesin, and an albuterol inhaler.  Patient given strict return precautions if at any point her breathing becomes distressed or she develops any persistent wheezing given her history of asthma.  Patient verbalized understanding and agreement with plan. ?Final Clinical Impressions(s) / UC Diagnoses  ? ?Final diagnoses:  ?Pneumonia due to infectious organism, unspecified laterality, unspecified part of lung  ?Immunocompromised (HCC)  ? ?Discharge Instructions   ?None ?  ? ?ED Prescriptions   ? ? Medication Sig Dispense Auth. Provider  ? promethazine-dextromethorphan (PROMETHAZINE-DM) 6.25-15 MG/5ML syrup Take 5 mLs by mouth 3 (three) times daily as needed for cough. 240 mL Bing Neighbors, FNP  ? Guaifenesin 1200 MG TB12 Take 1 tablet (1,200 mg total) by mouth every 12 (twelve) hours as needed. 14 tablet Bing Neighbors, FNP  ? albuterol (VENTOLIN HFA) 108 (90 Base) MCG/ACT inhaler Inhale 2 puffs into the lungs every 6 (six) hours as needed for wheezing or shortness of breath. 1 each Bing Neighbors, FNP  ? ?  ? ?PDMP not reviewed this  encounter. ?  ?Bing Neighbors, FNP ?08/01/21 1044 ? ?

## 2021-07-28 NOTE — Discharge Instructions (Signed)
Complete antibiotics.  Use albuterol inhaler every 6 hours as needed for wheezing or shortness of breath.  Prescribed you Promethazine DM for cough management and guaifenesin twice daily to help with mucus production.  Today point you develop a fever or your symptoms seem to worsen or you develop any shortness of breath that is not immediately relief with your albuterol inhaler, go to the nearest ER. ?

## 2021-07-28 NOTE — ED Triage Notes (Signed)
Patient c/o productive cough w/ "dark green" sputum x 1 weeks.  ? ?Patient endorses chest congestion.  ? ?Patient endorses being diagnosed with Pneumonia "last Sunday".  ? ?Patient has been taking Zithromax and Cefdinir with 2 days left of Cefdinir.  ? ? ?

## 2021-07-30 ENCOUNTER — Ambulatory Visit: Payer: Medicaid Other

## 2021-08-01 ENCOUNTER — Encounter (HOSPITAL_BASED_OUTPATIENT_CLINIC_OR_DEPARTMENT_OTHER): Payer: Self-pay | Admitting: Nurse Practitioner

## 2021-08-11 DIAGNOSIS — S8391XA Sprain of unspecified site of right knee, initial encounter: Secondary | ICD-10-CM | POA: Diagnosis not present

## 2021-08-18 ENCOUNTER — Ambulatory Visit (INDEPENDENT_AMBULATORY_CARE_PROVIDER_SITE_OTHER): Payer: Medicaid Other

## 2021-08-18 ENCOUNTER — Encounter (HOSPITAL_COMMUNITY): Payer: Self-pay

## 2021-08-18 ENCOUNTER — Ambulatory Visit (HOSPITAL_COMMUNITY)
Admission: EM | Admit: 2021-08-18 | Discharge: 2021-08-18 | Disposition: A | Discharge status: Home / Self Care | Payer: Medicaid Other | Attending: Physician Assistant | Admitting: Physician Assistant

## 2021-08-18 DIAGNOSIS — S8391XA Sprain of unspecified site of right knee, initial encounter: Secondary | ICD-10-CM

## 2021-08-18 DIAGNOSIS — M25561 Pain in right knee: Secondary | ICD-10-CM

## 2021-08-18 MED ORDER — DICLOFENAC SODIUM 75 MG PO TBEC: 75.0000 mg | DELAYED_RELEASE_TABLET | Freq: Two times a day (BID) | ORAL | 0 refills | Status: DC

## 2021-08-18 NOTE — ED Triage Notes (Signed)
Pt present right knee pain with limit range of motion. Pt states she walking around picking up stuff and when she step to walk away from where she was at her knee went down instead of bending.  ?

## 2021-08-18 NOTE — Discharge Instructions (Addendum)
Wear brace for the next week.  Schedule follow up with [facility] if pain persist past one week  ?

## 2021-08-18 NOTE — ED Provider Notes (Signed)
?[facility] ? ? ? ?CSN: [id number] ?Arrival date & time: 08/18/21  1516 ? ? ?  ? ?History   ?Chief Complaint ?Chief Complaint  ?Patient presents with  ? Knee Pain  ? ? ?HPI ?Erica Walton is a 26 y.o. female.  ? ?Pt reports she was walking and area below knee went backwards.  Pt complains of swelling and pain with walking.   ? ?The history is provided by the patient. No language interpreter was used.  ?Knee Pain ?Location:  Knee ?Time since incident:  1 day ?Injury: yes   ?Knee location:  R knee ?Pain details:  ?  Quality:  Aching ?  Radiates to:  Does not radiate ?  Severity:  Moderate ?  Onset quality:  Gradual ?  Duration:  1 day ?Associated symptoms: no fever   ? ?Past Medical History:  ?Diagnosis Date  ? Asthma   ? Genital herpes   ? HIV (human immunodeficiency virus infection) (HCC)   ? HIV positive (HCC) 06/16/2021  ? Hypolipidemia   ? ? ?Patient Active Problem List  ? Diagnosis Date Noted  ? Mixed hyperlipidemia 06/23/2021  ? Family history of Wilson's disease 06/23/2021  ? Dyshidrotic eczema 06/16/2021  ? Encounter for initial prescription of vaginal ring hormonal contraceptive 01/27/2021  ? Low back pain 09/23/2020  ? 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 Herpes 10/06/2013  ? Asthma 11/17/2011  ? Human immunodeficiency virus (HIV) disease (HCC) 11/17/2011  ? ? ?Past Surgical History:  ?Procedure Laterality Date  ? APPENDECTOMY    ? LAPAROSCOPIC APPENDECTOMY N/A 07/22/2019  ? Procedure: APPENDECTOMY LAPAROSCOPIC;  Surgeon: Carolan Shiverintron-Diaz, Edgardo, MD;  Location: [facility];  Service: General;  Laterality: N/A;  ? ? ?OB History   ? ? Gravida  ?5  ? Para  ?5  ? Term  ?5  ? Preterm  ?   ? AB  ?   ? Living  ?5  ?  ? ? SAB  ?   ? IAB  ?   ? Ectopic  ?   ? Multiple  ?0  ? Live Births  ?5  ?   ?  ?  ? ? ? ?Home Medications   ? ?Prior to Admission medications   ?Medication Sig Start Date End Date Taking? Authorizing Provider  ?albuterol (VENTOLIN HFA) 108 (90 Base) MCG/ACT inhaler Inhale 2 puffs into the  lungs every 6 (six) hours as needed for wheezing or shortness of breath. 07/28/21   Bing NeighborsHarris, Kimberly S, FNP  ?atorvastatin (LIPITOR) 20 MG tablet Take 1 tablet (20 mg total) by mouth daily. 06/23/21   Tollie EthEarly, Sara E, NP  ?azithromycin (ZITHROMAX) 250 MG tablet Take 1 tablet (250 mg total) by mouth daily. 07/20/21   Gilda CreasePollina, Christopher J, MD  ?cefdinir (OMNICEF) 300 MG capsule Take 1 capsule (300 mg total) by mouth 2 (two) times daily. 07/20/21   Gilda CreasePollina, Christopher J, MD  ?dolutegravir (TIVICAY) 50 MG tablet Take 1 tablet (50 mg total) by mouth every evening. 06/25/21   Blanchard Kelchixon, Stephanie N, NP  ?emtricitabine-tenofovir AF (DESCOVY) 200-25 MG tablet Take 1 tablet by mouth every evening. 06/25/21   Blanchard Kelchixon, Stephanie N, NP  ?etonogestrel-ethinyl estradiol (NUVARING) 0.12-0.015 MG/24HR vaginal ring Insert vaginally and leave in place for 3 consecutive weeks, then remove for 1 week. 06/16/21   Tollie EthEarly, Sara E, NP  ?fluconazole (DIFLUCAN) 150 MG tablet Take one tablet by mouth today and take second tablet by mouth in 3 days. If symptoms do not go away, refill and  repeat. For rash 06/16/21   Early, Sung Amabile, NP  ?Guaifenesin 1200 MG TB12 Take 1 tablet (1,200 mg total) by mouth every 12 (twelve) hours as needed. 07/28/21   Bing Neighbors, FNP  ?mometasone (ELOCON) 0.1 % cream Apply thin layer to rash twice a day for 7 days. 06/16/21   Tollie Eth, NP  ?Prenatal Vit-Fe Phos-FA-Omega (VITAFOL GUMMIES) 3.33-0.333-34.8 MG CHEW Chew 3 tablets by mouth daily. ?Patient not taking: Reported on 06/25/2021 04/02/21   Tereso Newcomer, MD  ?promethazine-dextromethorphan (PROMETHAZINE-DM) 6.25-15 MG/5ML syrup Take 5 mLs by mouth 3 (three) times daily as needed for cough. 07/28/21   Bing Neighbors, FNP  ?Vitamin D, Ergocalciferol, (DRISDOL) 1.25 MG (50000 UNIT) CAPS capsule Take 1 capsule (50,000 Units total) by mouth every 7 (seven) days. Take for 12 total doses(weeks) than can transition to 1000 units OTC supplement daily ?Patient not taking:  Reported on 06/25/2021 06/17/21   Tollie Eth, NP  ? ? ?Family History ?Family History  ?Problem Relation Age of Onset  ? Cancer Father   ? Breast cancer Other   ? ? ?Social History ?Social History  ? ?Tobacco Use  ? Smoking status: Never  ? Smokeless tobacco: Never  ?Substance Use Topics  ? Alcohol use: Not Currently  ?  Comment: 2018  ? Drug use: Never  ? ? ? ?Allergies   ?Latex, Norco [hydrocodone-acetaminophen], and Penicillins ? ? ?Review of Systems ?Review of Systems  ?Constitutional:  Negative for fever.  ?Musculoskeletal:  Positive for arthralgias and joint swelling.  ?All other systems reviewed and are negative. ? ? ?Physical Exam ?Triage Vital Signs ?ED Triage Vitals  ?Enc Vitals Group  ?   BP 08/18/21 1601 101/70  ?   Pulse Rate 08/18/21 1601 95  ?   Resp 08/18/21 1601 16  ?   Temp 08/18/21 1601 98.2 ?F (36.8 ?C)  ?   Temp Source 08/18/21 1601 Oral  ?   SpO2 08/18/21 1601 95 %  ?   Weight --   ?   Height --   ?   Head Circumference --   ?   Peak Flow --   ?   Pain Score 08/18/21 1600 8  ?   Pain Loc --   ?   Pain Edu? --   ?   Excl. in GC? --   ? ?No data found. ? ?Updated Vital Signs ?BP 101/70 (BP Location: Left Arm)   Pulse 95   Temp 98.2 ?F (36.8 ?C) (Oral)   Resp 16   LMP 08/18/2021   SpO2 95%  ? ?Visual Acuity ?Right Eye Distance:   ?Left Eye Distance:   ?Bilateral Distance:   ? ?Right Eye Near:   ?Left Eye Near:    ?Bilateral Near:    ? ?Physical Exam ?Vitals reviewed.  ?Constitutional:   ?   Appearance: Normal appearance.  ?Cardiovascular:  ?   Rate and Rhythm: Normal rate.  ?Pulmonary:  ?   Effort: Pulmonary effort is normal.  ?Musculoskeletal:     ?   General: Swelling and tenderness present.  ?   Comments: Tender right knee, no deformity  negative drawer sign.   ?Skin: ?   General: Skin is warm.  ?Neurological:  ?   General: No focal deficit present.  ?   Mental Status: She is alert.  ?Psychiatric:     ?   Mood and Affect: Mood normal.  ? ? ? ?UC Treatments / Results  ?Labs ?(all labs  ordered are listed, but only abnormal results are displayed) ?Labs Reviewed - No data to display ? ?EKG ? ? ?Radiology ?DG Knee Complete 4 Views Right ? ?Result Date: 08/18/2021 ?CLINICAL DATA:  Right knee pain, limited range of motion EXAM: RIGHT KNEE - COMPLETE 4+ VIEW COMPARISON:  10/06/2020 FINDINGS: Frontal, bilateral oblique, lateral views of the right knee are obtained. No acute fracture, subluxation, or dislocation. Joint spaces are well preserved. No joint effusion. IMPRESSION: 1. Unremarkable right knee. Electronically Signed   By: Sharlet Salina M.D.   On: 08/18/2021 17:18   ? ?Procedures ?Procedures (including critical care time) ? ?Medications Ordered in UC ?Medications - No data to display ? ?Initial Impression / Assessment and Plan / UC Course  ?I have reviewed the triage vital signs and the nursing notes. ? ?Pertinent labs & imaging results that were available during my care of the patient were reviewed by me and considered in my medical decision making (see chart for details). ? ?  ? ? ?Final Clinical Impressions(s) / UC Diagnoses  ? ?Final diagnoses:  ?Sprain of right knee, unspecified ligament, initial encounter  ? ? ? ?Discharge Instructions   ? ?  ?Wear brace for the next week.  Schedule follow up with [facility] if pain persist past one week  ? ? ? ?ED Prescriptions   ?None ?  ? ?PDMP not reviewed this encounter. ?  ?Elson Areas, PA-C ?08/18/21 1736 ? ?

## 2021-08-19 ENCOUNTER — Encounter: Payer: Self-pay | Admitting: Obstetrics and Gynecology

## 2021-08-19 ENCOUNTER — Ambulatory Visit: Payer: Medicaid Other | Admitting: Obstetrics and Gynecology

## 2021-08-19 VITALS — BP 119/72 | HR 89 | Ht 58.5 in | Wt 157.0 lb

## 2021-08-19 DIAGNOSIS — N393 Stress incontinence (female) (male): Secondary | ICD-10-CM | POA: Diagnosis not present

## 2021-08-19 DIAGNOSIS — M62838 Other muscle spasm: Secondary | ICD-10-CM

## 2021-08-19 DIAGNOSIS — N3281 Overactive bladder: Secondary | ICD-10-CM

## 2021-08-19 DIAGNOSIS — N8110 Cystocele, unspecified: Secondary | ICD-10-CM

## 2021-08-19 DIAGNOSIS — R350 Frequency of micturition: Secondary | ICD-10-CM | POA: Diagnosis not present

## 2021-08-19 MED ORDER — SOLIFENACIN SUCCINATE 5 MG PO TABS: 5.0000 mg | ORAL_TABLET | Freq: Every day | ORAL | 5 refills | Status: DC

## 2021-08-19 NOTE — Progress Notes (Signed)
[facility] ?New Patient Evaluation and Consultation ? ?Referring Provider: Caren Macadam,* ?PCP: [name], NP ?Date of Service: 08/19/2021 ? ?SUBJECTIVE ?Chief Complaint: New Patient (Initial Visit) ? ?History of Present Illness: KYLANA HASLETT is a 26 y.o. White or Caucasian female seen in consultation at the request of Dr. Ernestina Patches for evaluation of incontinence.   ? ?Review of records significant for: ?Has leakage with cough/ sneeze and movement. Has gone through pelvic floor PT ? ?Urinary Symptoms: ?Leaks urine with cough/ sneeze, laughing, lifting, with a full bladder, with movement to the bathroom, and with urgency SUI = UUI ?Leaks 3-4 time(s) per day.  ?Pad use: none ?She is bothered by her UI symptoms. ?PT helped a little bit.  ? ?Day time voids 4.  Nocturia: 1 times per night to void. ?Voiding dysfunction: she does not empty her bladder well.  ?does not use a catheter to empty bladder.  ?When urinating, she feels dribbling after finishing, the need to urinate multiple times in a row, and to push on her belly or vagina to empty bladder ? ? ?UTIs:  0  UTI's in the last year.   ?Denies history of blood in urine and kidney or bladder stones ? ?Pelvic Organ Prolapse Symptoms:                  ?She Admits to a feeling of a bulge the vaginal area. It has been present for 3 years.  ?She Denies seeing a bulge.  ?This bulge is bothersome. ? ?Bowel Symptom: ?Bowel movements: 1-2 times per week. Other times can be more frequent depending on what she eats.  ?Stool consistency: soft  ?Straining: yes.  ?Splinting: yes.  ?Incomplete evacuation: no.  ?She Denies accidental bowel leakage / fecal incontinence ?Bowel regimen: none ? ? ?Sexual Function ?Sexually active: yes.  ?Sexual orientation: Bisexual ?Pain with sex: Yes, deep in the pelvis, has discomfort due to prolapse ? ?Pelvic Pain ?Admits to pelvic pain ?Location: toward her cervix ?Pain occurs: when bladder is full ? ? ? ?Past Medical  History:  ?Past Medical History:  ?Diagnosis Date  ? Asthma   ? Genital herpes   ? HIV (human immunodeficiency virus infection) ([place])   ? HIV positive ([place]) 06/16/2021  ? Hypolipidemia   ? ? ? ?Past Surgical History:   ?Past Surgical History:  ?Procedure Laterality Date  ? APPENDECTOMY    ? LAPAROSCOPIC APPENDECTOMY N/A 07/22/2019  ? Procedure: APPENDECTOMY LAPAROSCOPIC;  Surgeon: Herbert Pun, MD;  Location: [facility];  Service: General;  Laterality: N/A;  ? ? ? ?Past OB/GYN History: ?OB History  ?Gravida Para Term Preterm AB Living  ?5 5 5     5   ?SAB IAB Ectopic Multiple Live Births  ?      0 5  ?  ?# Outcome Date GA Lbr Len/2nd Weight Sex Delivery Anes PTL Lv  ?5 Term 04/06/20 [redacted]w[redacted]d 02:45 / 00:04 7 lb 9.3 oz (3.439 kg) F Vag-Spont EPI  LIV  ?4 Term 11/05/18    F Vag-Spont   LIV  ?3 Term 12/06/17    F Vag-Spont   LIV  ?2 Term 05/31/13    F Vag-Spont   LIV  ?1 Term 04/04/12    F Vag-Spont   LIV  ? ?Patient's last menstrual period was 08/18/2021. ?Contraception: nuva ring. ?Any history of abnormal pap smears: no. ? ? ?Medications: She has a current medication list which includes the following prescription(s): albuterol, atorvastatin, azithromycin, cefdinir, diclofenac, tivicay, descovy,  etonogestrel-ethinyl estradiol, fluconazole, guaifenesin, mometasone, vitafol gummies, promethazine-dextromethorphan, solifenacin, and vitamin d (ergocalciferol).  ? ?Allergies: Patient is allergic to latex, norco [hydrocodone-acetaminophen], and penicillins.  ? ?Social History:  ?Social History  ? ?Tobacco Use  ? Smoking status: Never  ? Smokeless tobacco: Never  ?Vaping Use  ? Vaping Use: Never used  ?Substance Use Topics  ? Alcohol use: Not Currently  ?  Comment: 2018  ? Drug use: Never  ? ? ?Relationship status: married ?She lives with spouse and 5 children.   ?She is employed at [employer]. ?Regular exercise: No ?History of abuse: No ? ?Family History:   ?Family History  ?Problem Relation Age of Onset  ? Wilson's  disease Mother   ? Cancer Father   ? Breast cancer Other   ? ? ? ?Review of Systems: Review of Systems  ?Constitutional:  Positive for malaise/fatigue. Negative for fever and weight loss.  ?Respiratory:  Positive for cough and wheezing. Negative for shortness of breath.   ?Cardiovascular:  Negative for chest pain, palpitations and leg swelling.  ?Gastrointestinal:  Negative for abdominal pain and blood in stool.  ?Genitourinary:  Positive for dysuria.  ?Musculoskeletal:  Negative for myalgias.  ?Skin:  Negative for rash.  ?Neurological:  Negative for dizziness and headaches.  ?Endo/Heme/Allergies:  Bruises/bleeds easily.  ?Psychiatric/Behavioral:  Negative for depression. The patient is not nervous/anxious.   ? ? ?OBJECTIVE ?Physical Exam: ?Vitals:  ? 08/19/21 1626  ?BP: 119/72  ?Pulse: 89  ?Weight: 157 lb (71.2 kg)  ?Height: 4' 10.5" (1.486 m)  ? ? ?Physical Exam ?Constitutional:   ?   General: She is not in acute distress. ?Pulmonary:  ?   Effort: Pulmonary effort is normal.  ?Abdominal:  ?   General: There is no distension.  ?   Palpations: Abdomen is soft.  ?   Tenderness: There is no abdominal tenderness. There is no rebound.  ?Musculoskeletal:     ?   General: No swelling. Normal range of motion.  ?Skin: ?   General: Skin is warm and dry.  ?   Findings: No rash.  ?Neurological:  ?   Mental Status: She is alert and oriented to person, place, and time.  ?Psychiatric:     ?   Mood and Affect: Mood normal.     ?   Behavior: Behavior normal.  ? ? ? ?GU / Detailed Urogynecologic Evaluation:  ?Pelvic Exam: Normal external female genitalia; Bartholin's and Skene's glands normal in appearance; urethral meatus normal in appearance, no urethral masses or discharge.  ? ?CST: negative. ? ?Speculum exam reveals normal vaginal mucosa without atrophy. Cervix normal appearance. Uterus normal single, nontender. Adnexa no mass, fullness, tenderness.   ? ?Pelvic floor strength I/V ? ?Pelvic floor musculature: Right levator  tender, Right obturator non-tender, Left levator tender, Left obturator non-tender ? ?POP-Q:  ? ?POP-Q ? ?-1.5  ?                                          Aa   ?-1.5 ?                                          Ba  ?-7  ?  C  ? ?3.5  ?                                          Gh  ?3.5  ?                                          Pb  ?9  ?                                          tvl  ? ?-2  ?                                          Ap  ?-2  ?                                          Bp  ?-8.5  ?                                            D  ? ? ? ?Rectal Exam:  ?Normal external rectum ? ?Post-Void Residual (PVR) by Bladder Scan: ?In order to evaluate bladder emptying, we discussed obtaining a postvoid residual and she agreed to this procedure. ? ?Procedure: The ultrasound unit was placed on the patient's abdomen in the suprapubic region after the patient had voided. A PVR of 4 ml was obtained by bladder scan. ? ?Laboratory Results: ?POC urine: negative ? ? ?ASSESSMENT AND PLAN ?[name] is a 26 y.o. with:  ?1. Overactive bladder   ?2. SUI (stress urinary incontinence, female)   ?3. Prolapse of anterior vaginal wall   ? ?OAB ?- We discussed the symptoms of overactive bladder (OAB), which include urinary urgency, urinary frequency, nocturia, with or without urge incontinence.  While we do not know the exact etiology of OAB, several treatment options exist. We discussed management including behavioral therapy (decreasing bladder irritants, urge suppression strategies, timed voids, bladder retraining), physical therapy, medication.  ?- She would like to start a medication. Rx for vesicare 5mg  daily sent to pharmacy ? ?2. SUI ?- For treatment of stress urinary incontinence,  non-surgical options include expectant management, weight loss, physical therapy, as well as a pessary.  Surgical options include a midurethral sling, and transurethral injection of a bulking agent. Would  not recommend sling until she has completed childbearing.  ?- She is interested in urethral bulking. Handout provided on this option. Will have her return for simple CMG to demonstrate leakage.  ? ?3. Stage I an

## 2021-08-19 NOTE — Patient Instructions (Signed)
Constipation: Our goal is to achieve formed bowel movements daily or every-other-day.  You may need to try different combinations of the following options to find what works best for you - everybody's body works differently so feel free to adjust the dosages as needed.  Some options to help maintain bowel health include:  Dietary changes (more leafy greens, vegetables and fruits; less processed foods) Fiber supplementation (Benefiber, FiberCon, Metamucil or Psyllium). Start slow and increase gradually to full dose. Over-the-counter agents such as: stool softeners (Docusate or Colace) and/or laxatives (Miralax, milk of magnesia)  "Power Pudding" is a natural mixture that may help your constipation.  To make blend 1 cup applesauce, 1 cup wheat bran, and 3/4 cup prune juice, refrigerate and then take 1 tablespoon daily with a large glass of water as needed.  

## 2021-08-20 ENCOUNTER — Ambulatory Visit (HOSPITAL_BASED_OUTPATIENT_CLINIC_OR_DEPARTMENT_OTHER): Payer: Medicaid Other | Admitting: Nurse Practitioner

## 2021-08-20 LAB — POCT URINALYSIS DIPSTICK
Appearance: NORMAL
Bilirubin, UA: NEGATIVE
Blood, UA: NEGATIVE
Glucose, UA: NEGATIVE
Ketones, UA: NEGATIVE
Leukocytes, UA: NEGATIVE
Nitrite, UA: NEGATIVE
Protein, UA: NEGATIVE
Spec Grav, UA: >=1.03 — AB (ref 1.010–1.025)
Urobilinogen, UA: 0.2 E.U./dL
pH, UA: 5.5 (ref 5.0–8.0)

## 2021-08-20 NOTE — Addendum Note (Signed)
Addended by: Salina April on: 08/20/2021 07:51 AM ? ? Modules accepted: Orders ? ?

## 2021-08-22 ENCOUNTER — Encounter (HOSPITAL_BASED_OUTPATIENT_CLINIC_OR_DEPARTMENT_OTHER): Payer: Self-pay | Admitting: Nurse Practitioner

## 2021-08-22 ENCOUNTER — Ambulatory Visit (HOSPITAL_BASED_OUTPATIENT_CLINIC_OR_DEPARTMENT_OTHER): Payer: Medicaid Other | Admitting: Nurse Practitioner

## 2021-08-22 VITALS — BP 117/72 | HR 86 | Ht 59.0 in | Wt 153.4 lb

## 2021-08-22 DIAGNOSIS — M545 Low back pain, unspecified: Secondary | ICD-10-CM

## 2021-08-22 DIAGNOSIS — G8929 Other chronic pain: Secondary | ICD-10-CM | POA: Diagnosis not present

## 2021-08-22 DIAGNOSIS — L309 Dermatitis, unspecified: Secondary | ICD-10-CM | POA: Diagnosis not present

## 2021-08-22 MED ORDER — TRIAMCINOLONE ACETONIDE 0.1 % EX CREA: TOPICAL_CREAM | CUTANEOUS | 3 refills | Status: DC

## 2021-08-22 MED ORDER — PREDNISONE 50 MG PO TABS: 50.0000 mg | ORAL_TABLET | Freq: Every day | ORAL | 0 refills | Status: DC

## 2021-08-22 MED ORDER — KETOCONAZOLE 2 % EX CREA: 1.0000 "application " | TOPICAL_CREAM | Freq: Two times a day (BID) | CUTANEOUS | 3 refills | Status: DC

## 2021-08-22 NOTE — Patient Instructions (Addendum)
It was a pleasure seeing you today. I hope your time spent with [place] was pleasant and helpful. Please let us know if there is anything we can do to improve the service you receive.  ? ?I have sent in for a steroid burst for you to take for 5 days. Be sure to take this first in the morning so it won't keep you awake at night.  ? ?I am going to send in another inhaler for you. This will be every day to keep control of your symptoms. You can still use the albuterol for as needed.  ? ?I have sent in 2 different creams for you to use together on your hands to see if we can get this under control.  ? ?I am going to send a referral to the spinal specialist to see if they have any recommendations.  ? ? ?Important Office Information ?Lab Results ?If labs were ordered, please note that you will see results through MyChart as soon as they come available from [facility].  ?It takes up to 5 business days for the results to be routed to me and for me to review them once all of the lab results have come through from [facility]. I will make recommendations based on your results and send these through MyChart or someone from the office will call you to discuss. If your labs are abnormal, we may contact you to schedule a visit to discuss the results and make recommendations.  ?If you have not heard from [place] within 5 business days or you have waited longer than a week and your lab results have not come through on MyChart, please feel free to call the office or send a message through MyChart to follow-up on these labs.  ? ?Referrals ?If referrals were placed today, the office where the referral was sent will contact you either by phone or through MyChart to set up scheduling. Please note that it can take up to a week for the referral office to contact you. If you do not hear from them in a week, please contact the referral office directly to inquire about scheduling.  ? ?Condition Treated ?If your condition worsens or you begin to have new  symptoms, please schedule a follow-up appointment for further evaluation. If you are not sure if an appointment is needed, you may call the office to leave a message for the nurse and someone will contact you with recommendations.  ?If you have an urgent or life threatening emergency, please do not call the office, but seek emergency evaluation by calling 911 or going to the nearest emergency room for evaluation.  ? ?MyChart and Phone Calls ?Please do not use MyChart for urgent messages. It may take up to 3 business days for MyChart messages to be read by staff and if they are unable to handle the request, an additional 3 business days for them to be routed to me and for my response.  ?Messages sent to the provider through MyChart do not come directly to the provider, please allow time for these messages to be routed and for me to respond.  ?We get a large volume of MyChart messages daily and these are responded to in the order received.  ? ?For urgent messages, please call the office at [phone] and speak with the front office staff or leave a message on the line of my assistant for guidance.  ?We are seeing patients from the hours of 8:00 am through 5:00 pm and calls directly  to the nurse may not be answered immediately due to seeing patients, but your call will be returned as soon as possible.  ?Phone  messages received after 4:00 PM Monday through Thursday may not be returned until the following business day. Phone messages received after 11:00 AM on Friday may not be returned until Monday.  ? ?After Hours ?We share on call hours with providers from other offices. If you have an urgent need after hours that cannot wait until the next business day, please contact the on call provider by calling the office number. A nurse will speak with you and contact the provider if needed for recommendations.  ?If you have an urgent or life threatening emergency after hours, please do not call the on call provider, but  seek emergency evaluation by calling 911 or going to the nearest emergency room for evaluation.  ? ?Paperwork ?All paperwork requires a minimum of 5 days to complete and return to you or the designated personnel. Please keep this in mind when bringing in forms or sending requests for paperwork completion to the office.  ?  ?

## 2021-08-22 NOTE — Progress Notes (Signed)
?Erica Render, DNP, AGNP-c ?Primary Care & Sports Medicine ?[place], [place] [address] ?[phone] ? ?Subjective:  ? ?Erica Walton is a 26 y.o. female presents to day for back pain and eczema. ? ?Back pain ?Bilateral low back pain present for several years at this time ?Recent completion of physical therapy with no improvement or resolution of symptoms ?Reported history of mild scoliosis present ?Reports that pain has become so severe at this time that she is unable to perform her household chores routinely ?Endorses dull aching pain chronically with intermittent spasms and sharp pain ?No radiation to other areas, new or recent injury, bowel or bladder symptoms, weakness to lower extremities, discoloration or edema. ?Has previously managed successfully with use of muscle relaxers however this is no longer effective ?Negative effect on quality of life ?Rash ?Endorses intensely pruritic papular rash to the webbing between the fingers primarily on the right hand ?Has been present for some time now ?No improvement with recent use of mometasone ?Reports that she is scratching during her sleep and causing sores which is concerning to her for the potential of infection ?Has had similar rash in the past ?No fevers, chills, body aches, additional rashes, weight loss, purulent drainage, bleeding ? ?PMH, Medications, and Allergies reviewed and updated in chart.  ? ? ?Objective:  ?BP 117/72   Pulse 86   Ht 4\' 11"  (1.499 m)   Wt 153 lb 6.4 oz (69.6 kg)   LMP 08/18/2021   SpO2 92%   BMI 30.98 kg/m?  ?ROS negative except for what is listed in HPI. ? ?Physical Exam ?Vitals and nursing note reviewed.  ?Constitutional:   ?   Appearance: Normal appearance.  ?HENT:  ?   Head: Normocephalic.  ?Eyes:  ?   Extraocular Movements: Extraocular movements intact.  ?   Conjunctiva/sclera: Conjunctivae normal.  ?   Pupils: Pupils are equal, round, and reactive to light.  ?Pulmonary:   ?   Effort: Pulmonary effort is normal.  ?Musculoskeletal:     ?   General: Tenderness present.  ?   Lumbar back: Spasms, tenderness and bony tenderness present. No swelling, edema or deformity. Decreased range of motion.  ?   Right lower leg: No edema.  ?   Left lower leg: No edema.  ?   Comments: Tenderness present to the lumbar spine with decreased range of motion with lateral flexion bilaterally in flexion and extension.  Muscle spasming is noted to the lumbosacral area.  No signs of edema or injury presents today  ?Skin: ?   General: Skin is warm and dry.  ?   Capillary Refill: Capillary refill takes less than 2 seconds.  ?   Findings: Rash present.  ?   Comments: Erythematous papular rash noted to the webbing between the fingers on the right hand.  Excoriation and erythema present surrounding tissue consistent with scratching.  No streaking, drainage, warmth, or edema present at this time sensation and range of motion intact  ?Neurological:  ?   General: No focal deficit present.  ?   Mental Status: She is alert and oriented to person, place, and time.  ?Psychiatric:     ?   Mood and Affect: Mood normal.     ?   Behavior: Behavior normal.     ?   Thought Content: Thought content normal.     ?   Judgment: Judgment normal.  ? ? ? ?Assessment & Plan:  ? ?Problem  List Items Addressed This Visit   ? ? Low back pain  ?  Chronic.  Previous x-ray not completed after last evaluation however review of previous spinal x-ray from approximately 6 months ago shows no alarm symptoms present.  Suspect inflammatory etiology as patient is experiencing symptoms of spasming.  PT was not helpful for management of her symptoms discussed with patient the option of referral for further evaluation and possible injections.  She is interested in.  Further discussion made and joint decision to to trial oral steroid burst to see if this helps with relief of symptoms while she awaits this appointment.  No alarm symptoms present today.  We  will plan for follow-up as needed. ? ?  ?  ? Relevant Medications  ? predniSONE (DELTASONE) 50 MG tablet  ? Other Relevant Orders  ? Ambulatory referral to Spine Surgery  ? Dermatitis - Primary  ?  Initial presentation showed appearance of dishidrotic eczema, however, no symptom improvement with use of mometasone topical cream.  Today skin appears moderately erythematous with excoriation marks present and papules are more red in appearance consider possible fungal infectious etiology given the current presentation.  We will plan to begin treatment with triamcinolone combined with ketoconazole topically twice daily for a minimum of 5 days.  If no symptom improvement with current treatment recommend referral to dermatology for potential biopsy and further evaluation and recommendations. ? ? ?  ?  ? Relevant Medications  ? predniSONE (DELTASONE) 50 MG tablet  ? triamcinolone cream (KENALOG) 0.1 %  ? ketoconazole (NIZORAL) 2 % cream  ? ? ? ?History and Medication reviewed and updated this encounter.  ? ?Erica Render, DNP, AGNP-c ?08/22/2021  3:49 PM   ? ?

## 2021-08-22 NOTE — Assessment & Plan Note (Signed)
Initial presentation showed appearance of dishidrotic eczema, however, no symptom improvement with use of mometasone topical cream.  Today skin appears moderately erythematous with excoriation marks present and papules are more red in appearance consider possible fungal infectious etiology given the current presentation.  We will plan to begin treatment with triamcinolone combined with ketoconazole topically twice daily for a minimum of 5 days.  If no symptom improvement with current treatment recommend referral to dermatology for potential biopsy and further evaluation and recommendations. ? ?

## 2021-08-22 NOTE — Assessment & Plan Note (Signed)
Chronic.  Previous x-ray not completed after last evaluation however review of previous spinal x-ray from approximately 6 months ago shows no alarm symptoms present.  Suspect inflammatory etiology as patient is experiencing symptoms of spasming.  PT was not helpful for management of her symptoms discussed with patient the option of referral for further evaluation and possible injections.  She is interested in.  Further discussion made and joint decision to to trial oral steroid burst to see if this helps with relief of symptoms while she awaits this appointment.  No alarm symptoms present today.  We will plan for follow-up as needed. ?

## 2021-08-26 ENCOUNTER — Ambulatory Visit (HOSPITAL_BASED_OUTPATIENT_CLINIC_OR_DEPARTMENT_OTHER): Payer: Medicaid Other | Admitting: Nurse Practitioner

## 2021-09-09 ENCOUNTER — Encounter (HOSPITAL_BASED_OUTPATIENT_CLINIC_OR_DEPARTMENT_OTHER): Payer: Self-pay | Admitting: Nurse Practitioner

## 2021-09-10 ENCOUNTER — Encounter: Payer: Self-pay | Admitting: Obstetrics and Gynecology

## 2021-09-10 ENCOUNTER — Ambulatory Visit: Payer: Medicaid Other | Admitting: Obstetrics and Gynecology

## 2021-09-10 ENCOUNTER — Other Ambulatory Visit (HOSPITAL_BASED_OUTPATIENT_CLINIC_OR_DEPARTMENT_OTHER): Payer: Self-pay | Admitting: Nurse Practitioner

## 2021-09-10 VITALS — BP 110/67 | HR 66

## 2021-09-10 DIAGNOSIS — N3281 Overactive bladder: Secondary | ICD-10-CM | POA: Diagnosis not present

## 2021-09-10 DIAGNOSIS — E559 Vitamin D deficiency, unspecified: Secondary | ICD-10-CM

## 2021-09-10 NOTE — Progress Notes (Signed)
[facility] Return Visit  SUBJECTIVE  History of Present Illness: Erica Walton is a 26 y.o. female seen in follow-up for mixed incontinence. Plan at last visit was to start vesicare 5mg  and she was also interested in a procedure for SUI (urethral bulking).  Vesicare has been helping her a lot.  She is not having leakage every day anymore.    Past Medical History: Patient  has a past medical history of Asthma, Dyshidrotic eczema (06/16/2021), Genital herpes, HIV (human immunodeficiency virus infection) (HCC), HIV positive (HCC) (06/16/2021), and Hypolipidemia.   Past Surgical History: She  has a past surgical history that includes laparoscopic appendectomy (N/A, 07/22/2019) and Appendectomy.   Medications: She has a current medication list which includes the following prescription(s): albuterol, atorvastatin, tivicay, descovy, etonogestrel-ethinyl estradiol, ketoconazole, prednisone, promethazine-dextromethorphan, solifenacin, triamcinolone cream, and vitamin d (ergocalciferol).   Allergies: Patient is allergic to latex, norco [hydrocodone-acetaminophen], and penicillins.   Social History: Patient  reports that she has never smoked. She has never used smokeless tobacco. She reports that she does not currently use alcohol. She reports that she does not use drugs.      OBJECTIVE     Physical Exam: Vitals:   09/10/21 1350  BP: 110/67  Pulse: 66   Gen: No apparent distress, A&O x 3.  Detailed Urogynecologic Evaluation:  Deferred. Prior exam showed: POP-Q:    POP-Q   -1.5                                            Aa   -1.5                                           Ba   -7                                              C    3.5                                            Gh   3.5                                            Pb   9                                            tvl    -2                                            Ap   -2  Bp   -8.5                                              D     Verbal consent was obtained to perform simple CMG procedure:   Prolapse was reduced using 2 large cotton swabs. Urethra was prepped with betadine and a 19F catheter was placed and bladder was drained completely. The bladder was then backfilled with sterile water by gravity.  First sensation: First Desire: Strong Desire: Capacity: Small DO with filling. Cough stress test was negative. Valsalva stress test was negative.  Performed in bothg sitting and standing positions. She was was allowed to void on her own.   Interpretation: CMG showed normal sensation, and normal cystometric capacity. Findings negative for stress incontinence, positive for detrusor overactivity.       ASSESSMENT AND PLAN    [name] is a 26 y.o. with:  1. Overactive bladder    - Did not demonstrate SUI today with simple CMG - She has had good improvement with the vesicare for overactive bladder. Will continue on this medication. She is currently trying to get pregnant so advised her that she will need to stop the medication if she becomes pregnant.   Return 6 months or sooner if needed  Marguerita Beards, MD  Time spent: I spent 20 minutes dedicated to the care of this patient on the date of this encounter to include pre-visit review of records, face-to-face time with the patient and post visit documentation. Additional time was spent on the procedure.

## 2021-09-24 ENCOUNTER — Emergency Department (HOSPITAL_COMMUNITY)
Admission: EM | Admit: 2021-09-24 | Discharge: 2021-09-24 | Disposition: A | Discharge status: Home / Self Care | Payer: Medicaid Other | Attending: Emergency Medicine | Admitting: Emergency Medicine

## 2021-09-24 ENCOUNTER — Encounter (HOSPITAL_COMMUNITY): Payer: Self-pay | Admitting: Emergency Medicine

## 2021-09-24 ENCOUNTER — Other Ambulatory Visit: Payer: Self-pay

## 2021-09-24 DIAGNOSIS — M545 Low back pain, unspecified: Secondary | ICD-10-CM | POA: Insufficient documentation

## 2021-09-24 DIAGNOSIS — Z91040 Latex allergy status: Secondary | ICD-10-CM | POA: Insufficient documentation

## 2021-09-24 MED ORDER — NAPROXEN 375 MG PO TABS: 375.0000 mg | ORAL_TABLET | Freq: Two times a day (BID) | ORAL | 0 refills | Status: DC

## 2021-09-24 NOTE — ED Provider Notes (Signed)
[facility] EMERGENCY DEPARTMENT Provider Note   CSN: [id number] Arrival date & time: 09/24/21  1600     History  No chief complaint on file.   Erica Walton is a 26 y.o. female.  HPI  Patient presents due to lower back pain.  It is left-sided, started a day and a half ago.  It is worse with any twisting movement or lifting.  Denies any urinary symptoms, hematuria, dysuria, saddle anesthesia, bilateral lower extremity weakness or numbness.  Not having any chest pain or shortness of breath.  Denies any previous surgeries to her back or her oncologic history.  No blunt injury to it, patient is a Solicitor at [employer].  She has tried Tylenol and some ibuprofen with minimal relief.  Home Medications Prior to Admission medications   Medication Sig Start Date End Date Taking? Authorizing Provider  albuterol (VENTOLIN HFA) 108 (90 Base) MCG/ACT inhaler Inhale 2 puffs into the lungs every 6 (six) hours as needed for wheezing or shortness of breath. 07/28/21   Bing Neighbors, FNP  atorvastatin (LIPITOR) 20 MG tablet Take 1 tablet (20 mg total) by mouth daily. 06/23/21   Tollie Eth, NP  dolutegravir (TIVICAY) 50 MG tablet Take 1 tablet (50 mg total) by mouth every evening. 06/25/21   Blanchard Kelch, NP  emtricitabine-tenofovir AF (DESCOVY) 200-25 MG tablet Take 1 tablet by mouth every evening. 06/25/21   Blanchard Kelch, NP  etonogestrel-ethinyl estradiol (NUVARING) 0.12-0.015 MG/24HR vaginal ring Insert vaginally and leave in place for 3 consecutive weeks, then remove for 1 week. 06/16/21   Tollie Eth, NP  ketoconazole (NIZORAL) 2 % cream Apply 1 application. topically 2 (two) times daily. Apply to dermatitis with triamcinolone cream twice a day as needed for dermatitis. 08/22/21   Tollie Eth, NP  predniSONE (DELTASONE) 50 MG tablet Take 1 tablet (50 mg total) by mouth daily. 08/22/21   Tollie Eth, NP  promethazine-dextromethorphan (PROMETHAZINE-DM) 6.25-15  MG/5ML syrup Take 5 mLs by mouth 3 (three) times daily as needed for cough. 07/28/21   Bing Neighbors, FNP  solifenacin (VESICARE) 5 MG tablet Take 1 tablet (5 mg total) by mouth daily. 08/19/21   Marguerita Beards, MD  triamcinolone cream (KENALOG) 0.1 % Apply thin layer to rash twice a day as needed for dermatitis. 08/22/21   Tollie Eth, NP  Vitamin D, Ergocalciferol, (DRISDOL) 1.25 MG (50000 UNIT) CAPS capsule TAKE 1 CAPSULE BY MOUTH EVERY 7 DAYS. TAKE FOR 12 TOTAL DOSES(WEEKS) THAN CAN TRANSITION TO 1000 UNITS OTC SUPPLEMENT DAILY 09/10/21   Early, Sung Amabile, NP      Allergies    Latex, Norco [hydrocodone-acetaminophen], and Penicillins    Review of Systems   Review of Systems  Physical Exam Updated Vital Signs BP 116/87 (BP Location: Right Arm)   Pulse 95   Temp 98.3 F (36.8 C) (Oral)   Resp 15   SpO2 98%  Physical Exam Vitals and nursing note reviewed. Exam conducted with a chaperone present.  Constitutional:      Appearance: Normal appearance.  HENT:     Head: Normocephalic and atraumatic.  Eyes:     General: No scleral icterus.       Right eye: No discharge.        Left eye: No discharge.     Extraocular Movements: Extraocular movements intact.     Pupils: Pupils are equal, round, and reactive to light.  Cardiovascular:  Rate and Rhythm: Normal rate and regular rhythm.     Pulses: Normal pulses.     Heart sounds: Normal heart sounds. No murmur heard.   No friction rub. No gallop.  Pulmonary:     Effort: Pulmonary effort is normal. No respiratory distress.     Breath sounds: Normal breath sounds.  Abdominal:     General: Abdomen is flat. Bowel sounds are normal. There is no distension.     Palpations: Abdomen is soft.     Tenderness: There is no abdominal tenderness.  Musculoskeletal:        General: Tenderness present.     Comments: Left paraspinal tenderness  Skin:    General: Skin is warm and dry.     Coloration: Skin is not jaundiced.  Neurological:      Mental Status: She is alert. Mental status is at baseline.     Coordination: Coordination normal.     Comments: Cranial nerves III through XII are grossly intact, grip strength is equal bilaterally.  Lower extremity strength equal bilaterally.  DP PT 2+, patellar reflex intact and symmetric.    ED Results / Procedures / Treatments   Labs (all labs ordered are listed, but only abnormal results are displayed) Labs Reviewed - No data to display  EKG None  Radiology No results found.  Procedures Procedures    Medications Ordered in ED Medications - No data to display  ED Course/ Medical Decision Making/ A&P                           Medical Decision Making  Patient presents with low back pain.  Differential diagnosis includes not limited to sprain, nephrolithiasis, UTI, pyelonephritis, cauda equina, epidural abscess.  Patient does not have any IV drug use history, no surgeries to her back or trauma or I do not think this is cauda equina.  She is neurovascular intact, no focal deficits on neuro exam.  No CVA tenderness, I doubt this is nephrolithiasis specially given she is not having any hematuria.  Vitals are stable, at this time I considered imaging and lab work but I do not think would be particularly revealing.  I suspect this is most likely muscle strain, discussed with patient who is agreeable to Naprosyn trial and outpatient follow-up with PCP.        Final Clinical Impression(s) / ED Diagnoses Final diagnoses:  None    Rx / DC Orders ED Discharge Orders     None         Theron Arista, PA-C 09/24/21 1800    Eber Hong, MD 09/24/21 2247

## 2021-09-24 NOTE — Discharge Instructions (Signed)
Take the Naprosyn twice daily with food and water.  You can continue taking Tylenol as well.  Follow-up with your primary tomorrow or early next week for reevaluation.  If your symptoms do not improve by Monday need to be seen by your primary next week, if you start having loss of bladder or bowel function, numbness in your groin, loss of strength to your legs, chest pain, shortness of breath or fevers need to return back to ED for further evaluation.

## 2021-09-24 NOTE — ED Triage Notes (Signed)
Pt reports lower back pain and bilateral foot pain.

## 2021-09-25 ENCOUNTER — Encounter (HOSPITAL_BASED_OUTPATIENT_CLINIC_OR_DEPARTMENT_OTHER): Payer: Self-pay | Admitting: Nurse Practitioner

## 2021-09-25 ENCOUNTER — Telehealth: Payer: Self-pay

## 2021-09-25 NOTE — Telephone Encounter (Signed)
Transition Care Management Unsuccessful Follow-up Telephone Call  Date of discharge and from where:  09/24/2021 from [facility]  Attempts:  1st Attempt  Reason for unsuccessful TCM follow-up call:  Left voice message    

## 2021-09-26 NOTE — Telephone Encounter (Signed)
Transition Care Management Unsuccessful Follow-up Telephone Call  Date of discharge and from where:  09/24/2021 from [facility]  Attempts:  2nd Attempt  Reason for unsuccessful TCM follow-up call:  Left voice message

## 2021-09-29 NOTE — Telephone Encounter (Signed)
Transition Care Management Unsuccessful Follow-up Telephone Call  Date of discharge and from where:  09/24/2021 from [facility]  Attempts:  3rd Attempt  Reason for unsuccessful TCM follow-up call:  Unable to reach patient

## 2021-10-14 ENCOUNTER — Encounter (HOSPITAL_BASED_OUTPATIENT_CLINIC_OR_DEPARTMENT_OTHER): Payer: Self-pay | Admitting: Nurse Practitioner

## 2021-10-14 ENCOUNTER — Encounter: Payer: Self-pay | Admitting: Family Medicine

## 2021-10-14 ENCOUNTER — Other Ambulatory Visit: Payer: Self-pay | Admitting: Infectious Diseases

## 2021-10-14 ENCOUNTER — Encounter: Payer: Self-pay | Admitting: Obstetrics and Gynecology

## 2021-10-14 DIAGNOSIS — Z3201 Encounter for pregnancy test, result positive: Secondary | ICD-10-CM

## 2021-10-17 ENCOUNTER — Other Ambulatory Visit: Payer: Self-pay

## 2021-10-17 ENCOUNTER — Other Ambulatory Visit: Payer: Medicaid Other

## 2021-10-17 DIAGNOSIS — Z3201 Encounter for pregnancy test, result positive: Secondary | ICD-10-CM

## 2021-10-18 LAB — HCG, QUANTITATIVE, PREGNANCY: HCG, Total, QN: 196 m[IU]/mL

## 2021-11-10 ENCOUNTER — Encounter: Payer: Self-pay | Admitting: Family Medicine

## 2021-11-17 ENCOUNTER — Ambulatory Visit (INDEPENDENT_AMBULATORY_CARE_PROVIDER_SITE_OTHER): Payer: Medicaid Other | Admitting: *Deleted

## 2021-11-17 ENCOUNTER — Ambulatory Visit (INDEPENDENT_AMBULATORY_CARE_PROVIDER_SITE_OTHER): Payer: Medicaid Other

## 2021-11-17 VITALS — BP 121/79 | HR 99 | Wt 151.0 lb

## 2021-11-17 DIAGNOSIS — Z3A01 Less than 8 weeks gestation of pregnancy: Secondary | ICD-10-CM

## 2021-11-17 DIAGNOSIS — Z3481 Encounter for supervision of other normal pregnancy, first trimester: Secondary | ICD-10-CM

## 2021-11-17 DIAGNOSIS — Z3A08 8 weeks gestation of pregnancy: Secondary | ICD-10-CM

## 2021-11-17 DIAGNOSIS — O3680X0 Pregnancy with inconclusive fetal viability, not applicable or unspecified: Secondary | ICD-10-CM

## 2021-11-17 DIAGNOSIS — Z3490 Encounter for supervision of normal pregnancy, unspecified, unspecified trimester: Secondary | ICD-10-CM | POA: Insufficient documentation

## 2021-11-17 DIAGNOSIS — O09891 Supervision of other high risk pregnancies, first trimester: Secondary | ICD-10-CM

## 2021-11-17 DIAGNOSIS — O0990 Supervision of high risk pregnancy, unspecified, unspecified trimester: Secondary | ICD-10-CM | POA: Insufficient documentation

## 2021-11-17 HISTORY — DX: Supervision of high risk pregnancy, unspecified, unspecified trimester: O09.90

## 2021-11-17 MED ORDER — BLOOD PRESSURE KIT: 1.0000 | PACK | 0 refills | Status: DC

## 2021-11-17 NOTE — Progress Notes (Cosign Needed)
New OB Intake  I explained I am completing New OB Intake today. We discussed her EDD of 06/27/22 that is based on today's scan. LMP of 09/10/21. Pt is G6/P5. I reviewed her allergies, medications, Medical/Surgical/OB history, and appropriate screenings.   Patient Active Problem List   Diagnosis Date Noted   Supervision of high risk pregnancy, antepartum 11/17/2021   Mixed hyperlipidemia 06/23/2021   Herpes 10/06/2013   Asthma 11/17/2011   Human immunodeficiency virus (HIV) disease (HCC) 11/17/2011    Concerns addressed today  Delivery Plans:  Plans to deliver at [facility].   MyChart/Babyscripts MyChart access verified. I explained pt will have some visits in office and some virtually. Babyscripts app discussed and ordered.   Blood Pressure Cuff  BP cuff ordered. Discussed to be used for virtual visits and or if needed BP checks weekly.    Anatomy US Explained first scheduled [place] will be around 19 weeks.   Labs Discussed Avelina Laine genetic screening with patient. Would like Panorama at new OB visit. Routine prenatal labs needed.   Placed OB Box on problem list and updated   Patient informed that the ultrasound is considered a limited obstetric ultrasound and is not intended to be a complete ultrasound exam.  Patient also informed that the ultrasound is not being completed with the intent of assessing for fetal or placental anomalies or any pelvic abnormalities. Explained that the purpose of today's ultrasound is to assess for dating and fetal heart rate.  Patient acknowledges the purpose of the exam and the limitations of the study.      First visit review I reviewed new OB appt with pt. I explained she will have ob bloodwork with genetic screening. Explained pt will be seen by Dr Alvester Morin  at first visit.    Scheryl Marten, RN 11/17/2021  2:40 PM

## 2021-11-27 ENCOUNTER — Encounter (INDEPENDENT_AMBULATORY_CARE_PROVIDER_SITE_OTHER): Payer: Medicaid Other

## 2021-11-27 DIAGNOSIS — Z0279 Encounter for issue of other medical certificate: Secondary | ICD-10-CM | POA: Diagnosis not present

## 2021-11-28 ENCOUNTER — Encounter: Payer: Self-pay | Admitting: Infectious Diseases

## 2021-11-28 NOTE — Telephone Encounter (Signed)
Please see the MyChart message reply(ies) for my assessment and plan.    This patient gave consent for this Medical Advice Message and is aware that it may result in a bill to [employer], as well as the possibility of receiving a bill for a co-payment or deductible. They are an established patient, but are not seeking medical advice exclusively about a problem treated during an in person or video visit in the last seven days. I did not recommend an in person or video visit within seven days of my reply.    I spent a total of 18 minutes cumulative time within 7 days through [employer] in the form of creating a letter in support of requested assistance from the patient.   Rexene Alberts, NP

## 2021-12-08 ENCOUNTER — Ambulatory Visit (INDEPENDENT_AMBULATORY_CARE_PROVIDER_SITE_OTHER): Payer: Medicaid Other | Admitting: Family Medicine

## 2021-12-08 ENCOUNTER — Encounter: Payer: Self-pay | Admitting: Family Medicine

## 2021-12-08 ENCOUNTER — Other Ambulatory Visit (HOSPITAL_COMMUNITY)
Admission: RE | Admit: 2021-12-08 | Discharge: 2021-12-08 | Disposition: A | Discharge status: Home / Self Care | Payer: Medicaid Other | Source: Ambulatory Visit | Attending: Family Medicine | Admitting: Family Medicine

## 2021-12-08 VITALS — BP 116/80 | HR 87 | Wt 148.0 lb

## 2021-12-08 DIAGNOSIS — O0990 Supervision of high risk pregnancy, unspecified, unspecified trimester: Secondary | ICD-10-CM | POA: Insufficient documentation

## 2021-12-08 DIAGNOSIS — O0991 Supervision of high risk pregnancy, unspecified, first trimester: Secondary | ICD-10-CM

## 2021-12-08 DIAGNOSIS — O09893 Supervision of other high risk pregnancies, third trimester: Secondary | ICD-10-CM

## 2021-12-08 DIAGNOSIS — O0941 Supervision of pregnancy with grand multiparity, first trimester: Secondary | ICD-10-CM

## 2021-12-08 DIAGNOSIS — O0940 Supervision of pregnancy with grand multiparity, unspecified trimester: Secondary | ICD-10-CM

## 2021-12-08 DIAGNOSIS — Z3A11 11 weeks gestation of pregnancy: Secondary | ICD-10-CM

## 2021-12-08 DIAGNOSIS — O98711 Human immunodeficiency virus [HIV] disease complicating pregnancy, first trimester: Secondary | ICD-10-CM

## 2021-12-08 DIAGNOSIS — B009 Herpesviral infection, unspecified: Secondary | ICD-10-CM

## 2021-12-08 HISTORY — DX: Supervision of pregnancy with grand multiparity, unspecified trimester: O09.40

## 2021-12-08 HISTORY — DX: Supervision of other high risk pregnancies, third trimester: O09.893

## 2021-12-08 LAB — OB RESULTS CONSOLE HIV ANTIBODY (ROUTINE TESTING): HIV: REACTIVE

## 2021-12-08 LAB — OB RESULTS CONSOLE GC/CHLAMYDIA
Chlamydia: NEGATIVE
Neisseria Gonorrhea: NEGATIVE

## 2021-12-08 LAB — OB RESULTS CONSOLE RPR: RPR: NONREACTIVE

## 2021-12-08 LAB — OB RESULTS CONSOLE RUBELLA ANTIBODY, IGM: Rubella: IMMUNE

## 2021-12-08 LAB — HEPATITIS C ANTIBODY: HCV Ab: NEGATIVE

## 2021-12-08 LAB — OB RESULTS CONSOLE HEPATITIS B SURFACE ANTIGEN: Hepatitis B Surface Ag: NEGATIVE

## 2021-12-08 NOTE — Progress Notes (Signed)
INITIAL PRENATAL VISIT  Subjective:   Erica Walton is being seen today for her first obstetrical visit.  This is a planned pregnancy. This is a desired pregnancy.  She is at 89w2dgestation by LMP. Her obstetrical history is significant for  HIV . Relationship with FOB: spouse, living together. Patient does intend to breast feed. Pregnancy history fully reviewed.  Patient reports no complaints.  Indications for ASA therapy (per uptodate) One of the following: Previous pregnancy with preeclampsia, especially early onset and with an adverse outcome No Multifetal gestation No Chronic hypertension No Type 1 or 2 diabetes mellitus No Chronic kidney disease No Autoimmune disease (antiphospholipid syndrome, systemic lupus erythematosus) No  Two or more of the following: Nulliparity No Obesity (body mass index >30 kg/m2) No Family history of preeclampsia in mother or sister No Age ?35 years No Sociodemographic characteristics (African American race, low socioeconomic level) No Personal risk factors (eg, previous pregnancy with low birth weight or small for gestational age infant, previous adverse pregnancy outcome [eg, stillbirth], interval >10 years between pregnancies) No  Indications for early GDM screening  First-degree relative with diabetes Yes BMI >30kg/m2 No Age > 25 Yes Previous birth of an infant weighing ?4000 g No Gestational diabetes mellitus in a previous pregnancy No Glycated hemoglobin ?5.7 percent (39 mmol/mol), impaired glucose tolerance, or impaired fasting glucose on previous testing No High-risk race/ethnicity (eg, African American, Latino, Native American, Asian American, Pacific Islander) No Previous stillbirth of unknown cause No Maternal birthweight > 9 lbs No History of cardiovascular disease No Hypertension or on therapy for hypertension No High-density lipoprotein cholesterol level <35 mg/dL (0.90 mmol/L) and/or a triglyceride level >250 mg/dL (2.82  mmol/L) No Polycystic ovary syndrome No Physical inactivity No Other clinical condition associated with insulin resistance (eg, severe obesity, acanthosis nigricans) No Current use of glucocorticoids No   Early screening tests: FBS, A1C, Random CBG, glucose challenge   Review of Systems:   Review of Systems  Objective:    Obstetric History OB History  Gravida Para Term Preterm AB Living  '6 5 5     5  ' SAB IAB Ectopic Multiple Live Births        0 5    # Outcome Date GA Lbr Len/2nd Weight Sex Delivery Anes PTL Lv  6 Current           5 Term 04/06/20 [id number]:45 / 00:04 7 lb 9.3 oz (3.439 kg) F Vag-Spont EPI  LIV  4 Term 11/05/18    F Vag-Spont   LIV  3 Term 12/06/17    F Vag-Spont   LIV  2 Term 05/31/13    F Vag-Spont   LIV  1 Term 04/04/12    F Vag-Spont   LIV    Past Medical History:  Diagnosis Date   Asthma    Dyshidrotic eczema 06/16/2021   Family history of Wilson's disease 06/23/2021   Genital herpes    HIV [place] virus infection) [place]   HIV positive [place]/[place]2023   Hypolipidemia    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 1 each 2   dolutegravir (TIVICAY) 50 MG tablet Take 1 tablet (50 mg total) by mouth every evening. 30 tablet 5   emtricitabine-tenofovir AF (DESCOVY) 200-25 MG tablet Take 1 tablet by mouth every evening. 30 tablet 5   Prenatal MV & Min w/FA-DHA (PRENATAL GUMMIES PO) Take by mouth.     Blood Pressure KIT 1 Device by Does not apply route once a  week. (Patient not taking: Reported on 12/08/2021) 1 kit 0   naproxen (NAPROSYN) 375 MG tablet Take 1 tablet (375 mg total) by mouth 2 (two) times daily. 20 tablet 0   predniSONE (DELTASONE) 50 MG tablet Take 1 tablet (50 mg total) by mouth daily. 5 tablet 0   No current facility-administered medications on file prior to visit.    Allergies  Allergen Reactions   Latex Rash   Norco [Hydrocodone-Acetaminophen] Rash   Penicillins Rash and Other (See Comments)    Has patient had a PCN reaction causing immediate rash, facial/tongue/throat swelling, SOB or lightheadedness with hypotension: yes, VOJJ:[id number]} Has patient had a PCN reaction causing severe rash involving mucus membranes or skin necrosis: no:[id number]} Has patient had a PCN reaction that required hospitalization no:[id number]} Has patient had a PCN reaction occurring within the last 10 years: no:[id number]} If all of the above answers are "NO", then may proceed with Cephalosporin use.     Social History:  reports that she has never smoked. She has never used smokeless tobacco. She reports that she does not currently use alcohol. She reports that she does not use drugs.  Family History  Problem Relation Age of Onset   Wilson's disease Mother    Cancer Father    Breast cancer Other     The following portions of the patient's history were reviewed and updated as appropriate: allergies, current medications, past family history, past medical history, past social history, past surgical history and problem list.  Review of Systems Review of Systems  Constitutional:  Negative for chills and fever.  HENT:  Negative for congestion and sore throat.   Eyes:  Negative for pain and visual disturbance.  Respiratory:  Negative for cough, chest tightness and shortness of breath.   Cardiovascular:  Negative for chest pain.  Gastrointestinal:  Negative for abdominal pain, diarrhea, nausea and vomiting.  Endocrine: Negative for cold intolerance  and heat intolerance.  Genitourinary:  Negative for dysuria and flank pain.  Musculoskeletal:  Negative for back pain.  Skin:  Negative for rash.  Allergic/Immunologic: Negative for food allergies.  Neurological:  Negative for dizziness and light-headedness.  Psychiatric/Behavioral:  Negative for agitation.       Physical Exam:  BP 116/80   Pulse 87   Wt 148 lb (67.1 kg)   LMP 09/10/2021   BMI 29.89 kg/m  CONSTITUTIONAL: Well-developed, well-nourished female in no acute distress.  HENT:  Normocephalic, atraumatic, External right and left ear normal. Oropharynx is clear and moist EYES: Conjunctivae normal. No scleral icterus.  NECK: Normal range of motion, supple, no masses.  Normal thyroid.  SKIN: Skin is warm and dry. No rash noted. Not diaphoretic. No erythema. No pallor. MUSCULOSKELETAL: Normal range of motion. No tenderness.  No cyanosis, clubbing, or edema.   NEUROLOGIC: Alert and oriented to person, place, and time. Normal muscle tone coordination.  PSYCHIATRIC: Normal mood and affect. Normal behavior. Normal judgment and thought content. CARDIOVASCULAR: Normal heart rate noted, regular rhythm RESPIRATORY: Clear to auscultation  bilaterally. Effort and breath sounds normal, no problems with respiration noted. BREASTS: Symmetric in size. No masses, skin changes, nipple drainage, or lymphadenopathy. ABDOMEN: Soft, normal bowel sounds, no distention noted.  No tenderness, rebound or guarding. PELVIC: Deferred FHR: 162   Assessment:    Pregnancy: G1P0000 1. Supervision of high risk pregnancy, antepartum Up to date  2. HIV disease affecting pregnancy in first trimester ID aware of pregnancy Plans on attempting breastfeeding again- ID (adults and Peds ) supportive Continue meds at current doses   Plan:   Initial labs drawn. Prenatal vitamins. Problem list reviewed and updated. Reviewed in detail the nature of the practice with collaborative care between  Genetic  screening discussed: NIPS ordered. Role of ultrasound in pregnancy discussed; Anatomy US: ordered. Amniocentesis discussed: not indicated. Follow up in 4 weeks. Discussed clinic routines, schedule of care and testing, genetic screening options, involvement of students and residents under the direct supervision of APPs and doctors and presence of female providers. Pt verbalized understanding.  Future Appointments  Date Time Provider [place]  01/05/2022  3:30 PM [place] Callas, NP [facility]  01/08/2022  1:50 PM Darlina Rumpf, CNM [facility]  02/02/2022  1:50 PM Caren Macadam, MD [facility]  03/02/2022  1:30 PM Caren Macadam, MD [facility]     Caren Macadam, MD 12/08/2021 3:24 PM

## 2021-12-08 NOTE — Progress Notes (Signed)
NOB   HIV +  Last Pap: 05/16/20  Genetic Screening: Desires and pt wants to know gender.    CC: Breast Pain.  No longer having morning sickness. Migraine that lasted x 3 days no HA today.  Pt notes drinking water.

## 2021-12-08 NOTE — Addendum Note (Signed)
Addended by: Geanie Berlin on: 12/08/2021 04:13 PM   Modules accepted: Orders

## 2021-12-09 LAB — CBC/D/PLT+RPR+RH+ABO+RUBIGG...
Antibody Screen: NEGATIVE
Basophils Absolute: 0 10*3/uL (ref 0.0–0.2)
Basos: 0 %
EOS (ABSOLUTE): 0.2 10*3/uL (ref 0.0–0.4)
Eos: 2 %
HCV Ab: NONREACTIVE
HIV Screen 4th Generation wRfx: REACTIVE
Hematocrit: 43.1 % (ref 34.0–46.6)
Hemoglobin: 14 g/dL (ref 11.1–15.9)
Hepatitis B Surface Ag: NEGATIVE
Immature Grans (Abs): 0 10*3/uL (ref 0.0–0.1)
Immature Granulocytes: 0 %
Lymphocytes Absolute: 1.9 10*3/uL (ref 0.7–3.1)
Lymphs: 21 %
MCH: 27.4 pg (ref 26.6–33.0)
MCHC: 32.5 g/dL (ref 31.5–35.7)
MCV: 84 fL (ref 79–97)
Monocytes Absolute: 0.6 10*3/uL (ref 0.1–0.9)
Monocytes: 6 %
Neutrophils Absolute: 6.4 10*3/uL (ref 1.4–7.0)
Neutrophils: 71 %
Platelets: 355 10*3/uL (ref 150–450)
RBC: 5.11 x10E6/uL (ref 3.77–5.28)
RDW: 13.7 % (ref 11.7–15.4)
RPR Ser Ql: NONREACTIVE
Rh Factor: POSITIVE
Rubella Antibodies, IGG: 2.7 index (ref >0.99)
WBC: 9.1 10*3/uL (ref 3.4–10.8)

## 2021-12-09 LAB — HCV INTERPRETATION

## 2021-12-09 LAB — HIV 1/2 AB DIFFERENTIATION
HIV 1 Ab: REACTIVE
HIV 2 Ab: NONREACTIVE
NOTE (HIV CONF MULTIP: POSITIVE — AB

## 2021-12-10 LAB — CERVICOVAGINAL ANCILLARY ONLY
Chlamydia: NEGATIVE
Comment: NEGATIVE
Comment: NORMAL
Neisseria Gonorrhea: NEGATIVE

## 2021-12-10 LAB — CULTURE, OB URINE

## 2021-12-10 LAB — URINE CULTURE, OB REFLEX

## 2021-12-12 ENCOUNTER — Encounter: Payer: Self-pay | Admitting: Family Medicine

## 2021-12-14 LAB — PANORAMA PRENATAL TEST FULL PANEL:PANORAMA TEST PLUS 5 ADDITIONAL MICRODELETIONS: FETAL FRACTION: 10.3

## 2021-12-18 ENCOUNTER — Encounter: Payer: Self-pay | Admitting: Family Medicine

## 2021-12-30 ENCOUNTER — Encounter: Payer: Self-pay | Admitting: Family Medicine

## 2022-01-02 ENCOUNTER — Encounter: Payer: Self-pay | Admitting: Infectious Diseases

## 2022-01-05 ENCOUNTER — Ambulatory Visit: Payer: Medicaid Other | Admitting: Infectious Diseases

## 2022-01-08 ENCOUNTER — Ambulatory Visit (INDEPENDENT_AMBULATORY_CARE_PROVIDER_SITE_OTHER): Payer: Medicaid Other | Admitting: Advanced Practice Midwife

## 2022-01-08 VITALS — BP 137/78 | HR 118 | Wt 150.0 lb

## 2022-01-08 DIAGNOSIS — N644 Mastodynia: Secondary | ICD-10-CM

## 2022-01-08 DIAGNOSIS — O98712 Human immunodeficiency virus [HIV] disease complicating pregnancy, second trimester: Secondary | ICD-10-CM

## 2022-01-08 DIAGNOSIS — O0990 Supervision of high risk pregnancy, unspecified, unspecified trimester: Secondary | ICD-10-CM | POA: Diagnosis not present

## 2022-01-08 DIAGNOSIS — O98711 Human immunodeficiency virus [HIV] disease complicating pregnancy, first trimester: Secondary | ICD-10-CM

## 2022-01-08 DIAGNOSIS — O0992 Supervision of high risk pregnancy, unspecified, second trimester: Secondary | ICD-10-CM

## 2022-01-08 DIAGNOSIS — Z3A15 15 weeks gestation of pregnancy: Secondary | ICD-10-CM

## 2022-01-08 DIAGNOSIS — O9229 Other disorders of breast associated with pregnancy and the puerperium: Secondary | ICD-10-CM

## 2022-01-08 NOTE — Progress Notes (Signed)
   PRENATAL VISIT NOTE  Subjective:  Erica Walton is a 26 y.o. G6P5005 at [redacted]w[redacted]d being seen today for ongoing prenatal care.  She is currently monitored for the following issues for this high-risk pregnancy and has Asthma; Human immunodeficiency virus (HIV) disease ([place]); Herpes; Mixed hyperlipidemia; Supervision of high risk pregnancy, antepartum; Grand multiparity, antepartum; and HIV disease affecting pregnancy in first trimester on their problem list.  Patient reports  mild breast pain and tenderness. She denies lumps, lesions and nipple discharge .  Contractions: Not present. Vag. Bleeding: None.  Movement: Present. Denies leaking of fluid.   The following portions of the patient's history were reviewed and updated as appropriate: allergies, current medications, past family history, past medical history, past social history, past surgical history and problem list. Problem list updated.  Objective:   Vitals:   01/08/22 1356  BP: 137/78  Pulse: (!) 118  Weight: 150 lb (68 kg)    Fetal Status:     Movement: Present     General:  Alert, oriented and cooperative. Patient is in no acute distress.  Skin: Skin is warm and dry. No rash noted.   Cardiovascular: Normal heart rate noted  Respiratory: Normal respiratory effort, no problems with respiration noted  Abdomen: Soft, gravid, appropriate for gestational age.  Pain/Pressure: Absent     Pelvic: Cervical exam deferred        Extremities: Normal range of motion.  Edema: None  Mental Status: Normal mood and affect. Normal behavior. Normal judgment and thought content.   Assessment and Plan:  Pregnancy: [id number] at [redacted]w[redacted]d  1. Supervision of high risk pregnancy, antepartum - Routine care, anatomy scan already scheduled - AFP, Serum, Open Spina Bifida  2. Breast pain in pregnancy - Continue to monitor  3. HIV disease affecting pregnancy in second trimester - Labs coordinated per request from Janene Madeira, NP, patient aware  -  HIV 1 RNA quant-no reflex-bld - T-helper cells (CD4) count (not at [facility])  4. [redacted] weeks gestation of pregnancy   Preterm labor symptoms and general obstetric precautions including but not limited to vaginal bleeding, contractions, leaking of fluid and fetal movement were reviewed in detail with the patient. Please refer to After Visit Summary for other counseling recommendations.   Future Appointments  Date Time Provider [place]  01/30/2022  2:15 PM [facility] NURSE [facility]  01/30/2022  2:30 PM [facility] US2 [facility]  02/02/2022  1:50 PM Caren Macadam, MD [facility]  03/02/2022  1:30 PM Caren Macadam, MD [facility]    Darlina Rumpf, CNM

## 2022-01-08 NOTE — Progress Notes (Signed)
ROB [redacted]w[redacted]d  CC: Breast Pain and tenderness.

## 2022-01-11 LAB — HIV-1 RNA QUANT-NO REFLEX-BLD: HIV-1 RNA Viral Load: <20 copies/mL

## 2022-01-11 LAB — AFP, SERUM, OPEN SPINA BIFIDA
AFP MoM: 1.35
AFP Value: 42.7 ng/mL
Gest. Age on Collection Date: 15.5 weeks
Maternal Age At EDD: 28.3 yr
OSBR Risk 1 IN: 4151
Test Results:: NEGATIVE
Weight: 150 [lb_av]

## 2022-01-11 LAB — T-HELPER CELLS (CD4) COUNT (NOT AT ARMC)
% CD 4 Pos. Lymph.: 39.7 % (ref 30.8–58.5)
Absolute CD 4 Helper: 675 /uL (ref 359–1519)
Basophils Absolute: 0 10*3/uL (ref 0.0–0.2)
Basos: 0 %
EOS (ABSOLUTE): 0.3 10*3/uL (ref 0.0–0.4)
Eos: 4 %
Hematocrit: 34.3 % (ref 34.0–46.6)
Hemoglobin: 11.6 g/dL (ref 11.1–15.9)
Immature Grans (Abs): 0 10*3/uL (ref 0.0–0.1)
Immature Granulocytes: 1 %
Lymphocytes Absolute: 1.7 10*3/uL (ref 0.7–3.1)
Lymphs: 21 %
MCH: 28.3 pg (ref 26.6–33.0)
MCHC: 33.8 g/dL (ref 31.5–35.7)
MCV: 84 fL (ref 79–97)
Monocytes Absolute: 0.6 10*3/uL (ref 0.1–0.9)
Monocytes: 7 %
Neutrophils Absolute: 5.4 10*3/uL (ref 1.4–7.0)
Neutrophils: 67 %
Platelets: 284 10*3/uL (ref 150–450)
RBC: 4.1 x10E6/uL (ref 3.77–5.28)
RDW: 13.6 % (ref 11.7–15.4)
WBC: 8.1 10*3/uL (ref 3.4–10.8)

## 2022-01-23 ENCOUNTER — Emergency Department (HOSPITAL_COMMUNITY): Payer: Worker's Compensation

## 2022-01-23 ENCOUNTER — Emergency Department (HOSPITAL_COMMUNITY)
Admission: EM | Admit: 2022-01-23 | Discharge: 2022-01-23 | Disposition: A | Discharge status: Home / Self Care | Payer: Worker's Compensation | Attending: Emergency Medicine | Admitting: Emergency Medicine

## 2022-01-23 DIAGNOSIS — M25571 Pain in right ankle and joints of right foot: Secondary | ICD-10-CM | POA: Insufficient documentation

## 2022-01-23 DIAGNOSIS — O99891 Other specified diseases and conditions complicating pregnancy: Secondary | ICD-10-CM | POA: Insufficient documentation

## 2022-01-23 DIAGNOSIS — Z21 Asymptomatic human immunodeficiency virus [HIV] infection status: Secondary | ICD-10-CM | POA: Insufficient documentation

## 2022-01-23 DIAGNOSIS — Z91040 Latex allergy status: Secondary | ICD-10-CM | POA: Diagnosis not present

## 2022-01-23 DIAGNOSIS — M25572 Pain in left ankle and joints of left foot: Secondary | ICD-10-CM | POA: Insufficient documentation

## 2022-01-23 DIAGNOSIS — Y990 Civilian activity done for income or pay: Secondary | ICD-10-CM | POA: Diagnosis not present

## 2022-01-23 DIAGNOSIS — Z3A17 17 weeks gestation of pregnancy: Secondary | ICD-10-CM | POA: Insufficient documentation

## 2022-01-23 DIAGNOSIS — X501XXA Overexertion from prolonged static or awkward postures, initial encounter: Secondary | ICD-10-CM | POA: Diagnosis not present

## 2022-01-23 DIAGNOSIS — R1032 Left lower quadrant pain: Secondary | ICD-10-CM | POA: Insufficient documentation

## 2022-01-23 DIAGNOSIS — O26892 Other specified pregnancy related conditions, second trimester: Secondary | ICD-10-CM | POA: Insufficient documentation

## 2022-01-23 DIAGNOSIS — W19XXXA Unspecified fall, initial encounter: Secondary | ICD-10-CM

## 2022-01-23 NOTE — ED Provider Notes (Signed)
[facility] EMERGENCY DEPARTMENT Provider Note   CSN: [id number] Arrival date & time:        History  Chief Complaint  Patient presents with   Ankle Pain   Abdominal Pain    Patient BIB EMS with c/o right ankle pain and ABD pain due to fall today. [redacted]wks pregnant. VSS.     Erica Walton is a 26 y.o. female.  HPI Patient with no controlled HIV, 5 prior pregnancies now presents 7 weeks into her sixth pregnancy after mechanical fall that occurred at work.  She is here with EMS, additional details provided by those individuals. Patient notes that she was at work, when her feet got tangled and she twisted her ankle, stepped hard on the left side for pain to the right and currently has pain in both.  While almost falling she struck the left side of her abdomen as well.  Pregnancy is unremarkable according to her.  She did not hit her head, did not sustain additional injuries.  EMS reports no hemodynamic instability in route  Home Medications Prior to Admission medications   Medication Sig Start Date End Date Taking? Authorizing Provider  albuterol (VENTOLIN HFA) 108 (90 Base) MCG/ACT inhaler Inhale 2 puffs into the lungs every 6 (six) hours as needed for wheezing or shortness of breath. 07/28/21   Scot Jun, FNP  Blood Pressure KIT 1 Device by Does not apply route once a week. Patient not taking: Reported on 12/08/2021 11/17/21   Caren Macadam, MD  dolutegravir (TIVICAY) 50 MG tablet Take 1 tablet (50 mg total) by mouth every evening. 06/25/21   [place] Callas, NP  emtricitabine-tenofovir AF (DESCOVY) 200-25 MG tablet Take 1 tablet by mouth every evening. 06/25/21   [place] Callas, NP  Prenatal MV & Min w/FA-DHA (PRENATAL GUMMIES PO) Take by mouth.    [provider]      Allergies    Latex, Norco [hydrocodone-acetaminophen], and Penicillins    Review of Systems   Review of Systems  All other systems reviewed and are  negative.   Physical Exam Updated Vital Signs BP 121/70   Pulse 95   Temp 99.3 F (37.4 C) (Oral)   Resp 17   LMP 09/10/2021   SpO2 95%  Physical Exam Vitals and nursing note reviewed.  Constitutional:      General: She is not in acute distress.    Appearance: She is well-developed.  HENT:     Head: Normocephalic and atraumatic.  Eyes:     Conjunctiva/sclera: Conjunctivae normal.  Cardiovascular:     Rate and Rhythm: Normal rate and regular rhythm.  Pulmonary:     Effort: Pulmonary effort is normal. No respiratory distress.     Breath sounds: Normal breath sounds. No stridor.  Abdominal:     General: There is no distension.     Comments: Gravid abdomen  Musculoskeletal:     Comments: Both ankles without appreciable deformity.  Left ankle minimally tender medially.  Right ankle substantially tender medial.  She flexes and extends each ankle appropriately, follows commands reliably.  All toes are unremarkable.  No knee pain bilaterally  Skin:    General: Skin is warm and dry.  Neurological:     Mental Status: She is alert and oriented to person, place, and time.     Cranial Nerves: No cranial nerve deficit.  Psychiatric:        Mood and Affect: Mood normal.     ED  Results / Procedures / Treatments   Labs (all labs ordered are listed, but only abnormal results are displayed) Labs Reviewed - No data to display  EKG None  Radiology DG Ankle Complete Left  Result Date: 01/23/2022 CLINICAL DATA:  Trip and fall injury with bilateral ankle pain. EXAM: LEFT ANKLE COMPLETE - 3+ VIEW COMPARISON:  None Available. FINDINGS: There is no evidence of fracture, dislocation, or joint effusion. There is no evidence of arthropathy or other focal bone abnormality. Soft tissues are unremarkable. IMPRESSION: Negative. Electronically Signed   By: Lucienne Capers M.D.   On: 01/23/2022 20:41   DG Ankle Complete Right  Result Date: 01/23/2022 CLINICAL DATA:  Fall EXAM: RIGHT ANKLE -  COMPLETE 3+ VIEW COMPARISON:  None Available. FINDINGS: There is no evidence of fracture, dislocation, or joint effusion. There is no evidence of arthropathy or other focal bone abnormality. Soft tissues are unremarkable. IMPRESSION: Negative. Electronically Signed   By: Ronney Asters M.D.   On: 01/23/2022 20:40    Procedures Procedures    Medications Ordered in ED Medications - No data to display  ED Course/ Medical Decision Making/ A&P This patient with a Hx of G6, P5, HIV-positive female presents after mechanical fall with pain in both ankles, less so in her abdomen; this involves an extensive number of treatment options, and is a complaint that carries with it a high risk of complications and morbidity.    The differential diagnosis includes ankle pain secondary to fracture versus musculoskeletal strain, bilateral. Abdominal pain may be threatening to ongoing pregnancy   Social Determinants of Health:  HIV positive  Additional history obtained:  Additional history and/or information obtained from EMS, notable for details included in HPI above   After the initial evaluation, orders, including: Fetal heart tone assessment, bilateral x-rays were initiated.    On repeat evaluation of the patient stayed the same  Imaging Studies ordered:  I independently visualized and interpreted imaging which showed 2 normal x-rays of her ankles I agree with the radiologist interpretation  Consultations Obtained:  I requested consultation with the [facility] APP,  and discussed lab and imaging findings as well as pertinent plan - they recommend: Bleeding precautions, discharge with close outpatient follow-up  Dispostion / Final MDM:  After consideration of the diagnostic results and the patient's response to treatment, this adult female G6, P5, at about 17 weeks presents after minor fall.  Patient's primary concern is ankle pain, and these x-rays are reassuring, she is distally neurovascularly  unremarkable.  Patient also had minor left lateral abdominal blunt trauma, fetal heart tones here are normal she has no vaginal complaints, bleeding, and after discussion with [facility] APP, patient is appropriate for close outpatient follow-up.  Final Clinical Impression(s) / ED Diagnoses Final diagnoses:  Fall, initial encounter  Acute right ankle pain  Acute left ankle pain  Left lower quadrant abdominal pain    Rx / DC Orders ED Discharge Orders     None         Carmin Muskrat, MD 01/23/22 2112

## 2022-01-23 NOTE — Discharge Instructions (Signed)
As discussed, your evaluation today has been largely reassuring.  But, it is important that you monitor your condition carefully, and do not hesitate to return to the ED if you develop new, or concerning changes in your condition. ? ?Otherwise, please follow-up with your physician for appropriate ongoing care. ? ?

## 2022-01-30 ENCOUNTER — Ambulatory Visit: Payer: Medicaid Other | Admitting: *Deleted

## 2022-01-30 ENCOUNTER — Ambulatory Visit: Payer: Medicaid Other | Attending: Family Medicine

## 2022-01-30 VITALS — BP 111/63 | HR 89

## 2022-01-30 DIAGNOSIS — O0990 Supervision of high risk pregnancy, unspecified, unspecified trimester: Secondary | ICD-10-CM | POA: Diagnosis not present

## 2022-01-30 DIAGNOSIS — O98711 Human immunodeficiency virus [HIV] disease complicating pregnancy, first trimester: Secondary | ICD-10-CM | POA: Insufficient documentation

## 2022-02-02 ENCOUNTER — Other Ambulatory Visit: Payer: Self-pay | Admitting: *Deleted

## 2022-02-02 ENCOUNTER — Telehealth (INDEPENDENT_AMBULATORY_CARE_PROVIDER_SITE_OTHER): Payer: Medicaid Other | Admitting: Family Medicine

## 2022-02-02 ENCOUNTER — Encounter: Payer: Self-pay | Admitting: Family Medicine

## 2022-02-02 ENCOUNTER — Telehealth: Payer: Self-pay

## 2022-02-02 VITALS — BP 112/76

## 2022-02-02 DIAGNOSIS — O98712 Human immunodeficiency virus [HIV] disease complicating pregnancy, second trimester: Secondary | ICD-10-CM

## 2022-02-02 DIAGNOSIS — O0940 Supervision of pregnancy with grand multiparity, unspecified trimester: Secondary | ICD-10-CM

## 2022-02-02 DIAGNOSIS — O0992 Supervision of high risk pregnancy, unspecified, second trimester: Secondary | ICD-10-CM

## 2022-02-02 DIAGNOSIS — O0942 Supervision of pregnancy with grand multiparity, second trimester: Secondary | ICD-10-CM

## 2022-02-02 DIAGNOSIS — Z362 Encounter for other antenatal screening follow-up: Secondary | ICD-10-CM

## 2022-02-02 DIAGNOSIS — Z3A19 19 weeks gestation of pregnancy: Secondary | ICD-10-CM

## 2022-02-02 DIAGNOSIS — O0990 Supervision of high risk pregnancy, unspecified, unspecified trimester: Secondary | ICD-10-CM

## 2022-02-02 DIAGNOSIS — O98711 Human immunodeficiency virus [HIV] disease complicating pregnancy, first trimester: Secondary | ICD-10-CM

## 2022-02-02 NOTE — Progress Notes (Signed)
OBSTETRICS PRENATAL VIRTUAL VISIT ENCOUNTER NOTE  Provider location: [facility] [place] [facility]   Patient location: Home  I connected with Erica Walton on 02/02/22 at  1:50 PM EDT by MyChart Video Encounter and verified that I am speaking with the correct person using two identifiers. I discussed the limitations, risks, security and privacy concerns of performing an evaluation and management service virtually and the availability of in person appointments. I also discussed with the patient that there may be a patient responsible charge related to this service. The patient expressed understanding and agreed to proceed. Subjective:  Erica Walton is a 26 y.o. G6P5005 at [redacted]w[redacted]d being seen today for ongoing prenatal care.  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  Patient reports no complaints.  Contractions: Not present. Vag. Bleeding: None.  Movement: Present. Denies any leaking of fluid.   The following portions of the patient's history were reviewed and updated as appropriate: allergies, current medications, past family history, past medical history, past social history, past surgical history and problem list.   Objective:   Vitals:   02/02/22 1409  BP: 112/76    Fetal Status:     Movement: Present     General:  Alert, oriented and cooperative. Patient is in no acute distress.  Respiratory: Normal respiratory effort, no problems with respiration noted  Mental Status: Normal mood and affect. Normal behavior. Normal judgment and thought content.  Rest of physical exam deferred due to type of encounter  Imaging: [place] MFM OB DETAIL +14 WK  Result Date:  01/30/2022 ----------------------------------------------------------------------  OBSTETRICS REPORT                       (Signed Final 01/30/2022 04:11 pm) ---------------------------------------------------------------------- Patient Info  ID #:       [id number]                          D.O.B.:  Sep 01, 1995 (25 yrs)  Name:       Erica Walton                Visit Date: 01/30/2022 02:17 pm ---------------------------------------------------------------------- Performed By  Attending:        Tama High MD        Ref. Address:     [place]  Performed By:     Nevin Bloodgood          [facility]  Referred By:      [facility] ---------------------------------------------------------------------- Orders  #  Description                           Code        Ordered By  1  [place] MFM OB DETAIL +14 [place]               76811.01    [facility] ----------------------------------------------------------------------  #  Order #                     Accession #                Episode #  1  [id number]                   [id number]                 [id number] ---------------------------------------------------------------------- Indications  HIV affecting pregnancy, second trimester      [id number]  Obesity complicating pregnancy, second         O99.212  trimester (61)  [place] multiparity, antepartum                  O09.40  Asthma                                         O99.89 j45.909  [redacted] weeks gestation of pregnancy                Z3A.18  LR NIPS/Neg AFP ---------------------------------------------------------------------- Fetal Evaluation  Num Of Fetuses:         1  Fetal Heart Rate(bpm):  158  Cardiac Activity:        Observed  Presentation:           Cephalic  Placenta:               Posterior  P. Cord Insertion:      Visualized  Amniotic Fluid  AFI FV:      Within normal limits                              Largest Pocket(cm)                              7.7 ---------------------------------------------------------------------- Biometry  BPD:      40.2  mm     G. Age:  18w 1d         23  %    CI:        72.72   %    70 - 86  FL/HC:      18.7   %    16.1 - 18.3  HC:      149.9  mm     G. Age:  18w 0d         11  %    HC/AC:      1.11        1.09 - 1.39  AC:      134.9  mm     G. Age:  19w 0d         49  %    FL/BPD:     69.7   %  FL:         28  mm     G. Age:  18w 4d         33  %    FL/AC:      20.8   %    20 - 24  HUM:      27.9  mm     G. Age:  19w 0d         54  %  CER:      19.9  mm     G. Age:  19w 2d         56  %  NFT:       4.6  mm  LV:        5.8  mm  CM:        3.8  mm  Est. FW:     252  gm      0 lb 9 oz     35  % ---------------------------------------------------------------------- OB History  Gravidity:    6         Term:   5        Prem:   0        SAB:   0  TOP:          0       Ectopic:  0        Living: 5 ---------------------------------------------------------------------- Gestational Age  LMP:           20w 2d        Date:  09/10/21                  EDD:   06/17/22  U/S Today:     18w 3d                                        EDD:   06/30/22  Best:          18w 6d     Det. By:  U/S C R L  (11/17/21)    EDD:   06/27/22 ---------------------------------------------------------------------- Anatomy  Cranium:               Appears normal         LVOT:                   Appears normal  Cavum:                 Appears normal         Aortic Arch:            Appears normal  Ventricles:            Appears normal  Ductal Arch:            Appears normal  Choroid Plexus:        Appears normal         Diaphragm:              Appears normal  Cerebellum:             Appears normal         Stomach:                Appears normal, left                                                                        sided  Posterior Fossa:       Appears normal         Abdomen:                Appears normal  Nuchal Fold:           Appears normal         Abdominal Wall:         Not well visualized  Face:                  Orbits appear          Cord Vessels:           Appears normal (3                         normal                                         vessel cord)  Lips:                  Appears normal         Kidneys:                Appear normal  Palate:                Not well visualized    Bladder:                Appears normal  Thoracic:              Appears normal         Spine:                  Limited views                                                                        appear normal  Heart:                 Not well visualized    Upper Extremities:      Visualized  RVOT:  Appears normal         Lower Extremities:      Visualized  Other:  Fetus appears to be female. Heels and feet visualized. Rt open hand          visualized. Lenses visualized. VC visualized Technically difficult due          to fetal position. ---------------------------------------------------------------------- Targeted Anatomy  Spine  Cervical:              Appears normal         Lumbar:                 Not well visualized  Thoracic:              Appears normal         Sacral:                 Not well visualized  Thorax  SVC:                   Not well visualized    3 V Trachea View:       Appears normal  3 Vessel View:         Appears normal         IVC:                    Not well visualized ---------------------------------------------------------------------- Cervix Uterus Adnexa  Cervix  Length:            4.5  cm.  Normal appearance by transabdominal scan.  Uterus  No abnormality visualized.  Right Ovary  Within normal limits.  Left Ovary  Not visualized.  Cul De Sac  No free fluid  seen.  Adnexa  No abnormality visualized. ---------------------------------------------------------------------- Impression  G6 P5005.  Patient is here for fetal anatomy scan.  Obstetric  history is significant for 5 term vaginal deliveries.  History is significant for HIV infection.  Recent viral RNA  levels are undetectable.  She is on antiretroviral medications.  Fetal DNA screening, the risks of fetal aneuploidies are not  increased.  MSAFP screening showed low risk for open  neural tube defects.  We performed a fetal anatomy scan. No markers of  aneuploidies or fetal structural defects are seen. Fetal  biometry is consistent with her previously-established dates.  Amniotic fluid is normal and good fetal activity is seen.  Patient understands the limitations of ultrasound in detecting  fetal anomalies. ---------------------------------------------------------------------- Recommendations  -An appointment was made for her to return in 5 weeks for  completion of fetal anatomy. ----------------------------------------------------------------------                  Tama High, MD Electronically Signed Final Report   01/30/2022 04:11 pm ----------------------------------------------------------------------  DG Ankle Complete Left  Result Date: 01/23/2022 CLINICAL DATA:  Trip and fall injury with bilateral ankle pain. EXAM: LEFT ANKLE COMPLETE - 3+ VIEW COMPARISON:  None Available. FINDINGS: There is no evidence of fracture, dislocation, or joint effusion. There is no evidence of arthropathy or other focal bone abnormality. Soft tissues are unremarkable. IMPRESSION: Negative. Electronically Signed   By: Lucienne Capers M.D.   On: 01/23/2022 20:41   DG Ankle Complete Right  Result Date: 01/23/2022 CLINICAL DATA:  Fall EXAM: RIGHT ANKLE - COMPLETE 3+ VIEW COMPARISON:  None Available. FINDINGS: There is no evidence of fracture, dislocation, or joint effusion. There is no evidence of arthropathy or other focal  bone abnormality. Soft tissues are  unremarkable. IMPRESSION: Negative. Electronically Signed   By: Ronney Asters M.D.   On: 01/23/2022 20:40    Assessment and Plan:  Pregnancy: G6P5005 at [redacted]w[redacted]d  1. Supervision of high risk pregnancy, antepartum Needs a work note-- reports flaring sciatica. Able to work 5-6 hours but longer are harder. Note proved Reports fatigue and reflux as well.  Vigorous movement  2. HIV disease affecting pregnancy in first trimester Well controlled Planning on IOL 39 weeks. *after 06/20/21  3. [place] multiparity, antepartum PPH risk  Preterm labor symptoms and general obstetric precautions including but not limited to vaginal bleeding, contractions, leaking of fluid and fetal movement were reviewed in detail with the patient. I discussed the assessment and treatment plan with the patient. The patient was provided an opportunity to ask questions and all were answered. The patient agreed with the plan and demonstrated an understanding of the instructions. The patient was advised to call back or seek an in-person office evaluation/go to [facility]U at [facility] for any urgent or concerning symptoms. Please refer to After Visit Summary for other counseling recommendations.   I provided 15 minutes of face-to-face time during this encounter.  Return in about 4 weeks (around 03/02/2022) for Routine prenatal care.  Future Appointments  Date Time Provider [place]  03/02/2022  1:30 PM Caren Macadam, MD [facility]  03/06/2022 12:30 PM [facility] NURSE [facility]  03/06/2022 12:45 PM [facility] US4 [facility] [place], [place] for [place]

## 2022-02-02 NOTE — Telephone Encounter (Signed)
Left message for pt to call office back regarding future OB visits.

## 2022-02-09 ENCOUNTER — Encounter: Payer: Self-pay | Admitting: *Deleted

## 2022-02-21 ENCOUNTER — Encounter (HOSPITAL_BASED_OUTPATIENT_CLINIC_OR_DEPARTMENT_OTHER): Payer: Self-pay | Admitting: Nurse Practitioner

## 2022-02-24 NOTE — Telephone Encounter (Signed)
Given her current pregnancy, I recommend that she reach out to her OB/GYN for recommendations on safe medications to use for eczema and yeast. There are many options, but I want to make sure that the medication is considered safe for use during pregnancy to reduce the risk of harm to the baby.

## 2022-02-26 ENCOUNTER — Encounter: Payer: Self-pay | Admitting: Family Medicine

## 2022-03-02 ENCOUNTER — Ambulatory Visit (INDEPENDENT_AMBULATORY_CARE_PROVIDER_SITE_OTHER): Payer: Medicaid Other | Admitting: Family Medicine

## 2022-03-02 ENCOUNTER — Encounter: Payer: Self-pay | Admitting: Family Medicine

## 2022-03-02 VITALS — BP 101/68 | HR 97 | Wt 155.0 lb

## 2022-03-02 DIAGNOSIS — O0990 Supervision of high risk pregnancy, unspecified, unspecified trimester: Secondary | ICD-10-CM

## 2022-03-02 DIAGNOSIS — O0992 Supervision of high risk pregnancy, unspecified, second trimester: Secondary | ICD-10-CM

## 2022-03-02 DIAGNOSIS — O98711 Human immunodeficiency virus [HIV] disease complicating pregnancy, first trimester: Secondary | ICD-10-CM

## 2022-03-02 DIAGNOSIS — O0940 Supervision of pregnancy with grand multiparity, unspecified trimester: Secondary | ICD-10-CM

## 2022-03-02 DIAGNOSIS — Z23 Encounter for immunization: Secondary | ICD-10-CM

## 2022-03-02 DIAGNOSIS — Z3A23 23 weeks gestation of pregnancy: Secondary | ICD-10-CM

## 2022-03-02 DIAGNOSIS — O0942 Supervision of pregnancy with grand multiparity, second trimester: Secondary | ICD-10-CM

## 2022-03-02 NOTE — Progress Notes (Signed)
ROB [redacted]w[redacted]d  CC: None   Flu Vaccine :Offered and pt will receive.

## 2022-03-02 NOTE — Progress Notes (Signed)
   PRENATAL VISIT NOTE  Subjective:  Erica Walton is a 26 y.o. G6P5005 at [redacted]w[redacted]d being seen today for ongoing prenatal care.  She is currently monitored for the following issues for this high-risk pregnancy and has Asthma; Human immunodeficiency virus (HIV) disease (HCC); Herpes; Mixed hyperlipidemia; Supervision of high risk pregnancy, antepartum; Grand multiparity, antepartum; and HIV disease affecting pregnancy in first trimester on their problem list.  Patient reports no complaints.  Contractions: Not present. Vag. Bleeding: None.  Movement: Present. Denies leaking of fluid.   The following portions of the patient's history were reviewed and updated as appropriate: allergies, current medications, past family history, past medical history, past social history, past surgical history and problem list.   Objective:   Vitals:   03/02/22 1340  BP: 101/68  Pulse: 97  Weight: 155 lb (70.3 kg)    Fetal Status: Fetal Heart Rate (bpm): 156 Fundal Height: 23 cm Movement: Present     General:  Alert, oriented and cooperative. Patient is in no acute distress.  Skin: Skin is warm and dry. No rash noted.   Cardiovascular: Normal heart rate noted  Respiratory: Normal respiratory effort, no problems with respiration noted  Abdomen: Soft, gravid, appropriate for gestational age.  Pain/Pressure: Present     Pelvic: Cervical exam deferred        Extremities: Normal range of motion.  Edema: None  Mental Status: Normal mood and affect. Normal behavior. Normal judgment and thought content.   Assessment and Plan:  Pregnancy: G6P5005 at [redacted]w[redacted]d 1. Supervision of high risk pregnancy, antepartum FH appropriate Has MFM follow up US Grand multip-- high PPH risk  2. Flu vaccine need - Flu Vaccine QUAD 36+ mos IM (Fluarix, Quad PF)  3. HIV- well controlled on medications. Regular growth [place]  Preterm labor symptoms and general obstetric precautions including but not limited to vaginal bleeding,  contractions, leaking of fluid and fetal movement were reviewed in detail with the patient. Please refer to After Visit Summary for other counseling recommendations.   Return in about 4 weeks (around 03/30/2022) for Routine prenatal care.  Future Appointments  Date Time Provider Department Center  03/06/2022 12:30 PM [facility] NURSE [facility]  03/06/2022 12:45 PM [facility] US4 [facility]    Federico Flake, MD

## 2022-03-06 ENCOUNTER — Ambulatory Visit: Payer: Medicaid Other | Attending: Obstetrics and Gynecology

## 2022-03-06 ENCOUNTER — Ambulatory Visit: Payer: Medicaid Other | Admitting: *Deleted

## 2022-03-06 ENCOUNTER — Other Ambulatory Visit: Payer: Self-pay | Admitting: *Deleted

## 2022-03-06 VITALS — BP 109/67 | HR 81

## 2022-03-06 DIAGNOSIS — O0942 Supervision of pregnancy with grand multiparity, second trimester: Secondary | ICD-10-CM | POA: Diagnosis not present

## 2022-03-06 DIAGNOSIS — O98719 Human immunodeficiency virus [HIV] disease complicating pregnancy, unspecified trimester: Secondary | ICD-10-CM

## 2022-03-06 DIAGNOSIS — E669 Obesity, unspecified: Secondary | ICD-10-CM

## 2022-03-06 DIAGNOSIS — O0943 Supervision of pregnancy with grand multiparity, third trimester: Secondary | ICD-10-CM

## 2022-03-06 DIAGNOSIS — B20 Human immunodeficiency virus [HIV] disease: Secondary | ICD-10-CM | POA: Diagnosis not present

## 2022-03-06 DIAGNOSIS — O0990 Supervision of high risk pregnancy, unspecified, unspecified trimester: Secondary | ICD-10-CM

## 2022-03-06 DIAGNOSIS — O98712 Human immunodeficiency virus [HIV] disease complicating pregnancy, second trimester: Secondary | ICD-10-CM | POA: Insufficient documentation

## 2022-03-06 DIAGNOSIS — O99512 Diseases of the respiratory system complicating pregnancy, second trimester: Secondary | ICD-10-CM | POA: Diagnosis not present

## 2022-03-06 DIAGNOSIS — O99213 Obesity complicating pregnancy, third trimester: Secondary | ICD-10-CM

## 2022-03-06 DIAGNOSIS — J45909 Unspecified asthma, uncomplicated: Secondary | ICD-10-CM | POA: Diagnosis not present

## 2022-03-06 DIAGNOSIS — Z3A23 23 weeks gestation of pregnancy: Secondary | ICD-10-CM

## 2022-03-06 DIAGNOSIS — Z362 Encounter for other antenatal screening follow-up: Secondary | ICD-10-CM | POA: Insufficient documentation

## 2022-03-06 DIAGNOSIS — O99212 Obesity complicating pregnancy, second trimester: Secondary | ICD-10-CM | POA: Diagnosis not present

## 2022-03-17 ENCOUNTER — Ambulatory Visit: Payer: Medicaid Other | Admitting: Obstetrics and Gynecology

## 2022-03-25 ENCOUNTER — Encounter: Payer: Self-pay | Admitting: Obstetrics & Gynecology

## 2022-03-27 ENCOUNTER — Other Ambulatory Visit: Payer: Medicaid Other

## 2022-03-27 ENCOUNTER — Encounter: Payer: Medicaid Other | Admitting: Obstetrics & Gynecology

## 2022-04-07 ENCOUNTER — Encounter: Payer: Self-pay | Admitting: Obstetrics and Gynecology

## 2022-04-07 ENCOUNTER — Other Ambulatory Visit: Payer: Medicaid Other

## 2022-04-07 ENCOUNTER — Ambulatory Visit (INDEPENDENT_AMBULATORY_CARE_PROVIDER_SITE_OTHER): Payer: Medicaid Other | Admitting: Obstetrics and Gynecology

## 2022-04-07 VITALS — BP 110/75 | HR 93 | Wt 164.0 lb

## 2022-04-07 DIAGNOSIS — Z23 Encounter for immunization: Secondary | ICD-10-CM | POA: Diagnosis not present

## 2022-04-07 DIAGNOSIS — O99013 Anemia complicating pregnancy, third trimester: Secondary | ICD-10-CM

## 2022-04-07 DIAGNOSIS — Z3A28 28 weeks gestation of pregnancy: Secondary | ICD-10-CM

## 2022-04-07 DIAGNOSIS — B20 Human immunodeficiency virus [HIV] disease: Secondary | ICD-10-CM

## 2022-04-07 DIAGNOSIS — O0990 Supervision of high risk pregnancy, unspecified, unspecified trimester: Secondary | ICD-10-CM | POA: Diagnosis not present

## 2022-04-07 DIAGNOSIS — O0943 Supervision of pregnancy with grand multiparity, third trimester: Secondary | ICD-10-CM

## 2022-04-07 DIAGNOSIS — O0993 Supervision of high risk pregnancy, unspecified, third trimester: Secondary | ICD-10-CM

## 2022-04-07 DIAGNOSIS — O0940 Supervision of pregnancy with grand multiparity, unspecified trimester: Secondary | ICD-10-CM

## 2022-04-07 DIAGNOSIS — Z1332 Encounter for screening for maternal depression: Secondary | ICD-10-CM

## 2022-04-07 DIAGNOSIS — J4541 Moderate persistent asthma with (acute) exacerbation: Secondary | ICD-10-CM

## 2022-04-07 DIAGNOSIS — B009 Herpesviral infection, unspecified: Secondary | ICD-10-CM

## 2022-04-07 MED ORDER — ALBUTEROL SULFATE HFA 108 (90 BASE) MCG/ACT IN AERS: 2.0000 | INHALATION_SPRAY | Freq: Four times a day (QID) | RESPIRATORY_TRACT | 2 refills | Status: DC | PRN

## 2022-04-07 NOTE — Progress Notes (Signed)
ROB 28w 3d  2hr GTT today.  Tdap: Will receive today  Needs Rx sent for Albuterol. Pt states she has had a flare in Asthma.

## 2022-04-07 NOTE — Progress Notes (Signed)
   PRENATAL VISIT NOTE  Subjective:  Erica Walton is a 26 y.o. G6P5005 at [redacted]w[redacted]d being seen today for ongoing prenatal care.  She is currently monitored for the following issues for this high-risk pregnancy and has Asthma; Human immunodeficiency virus (HIV) disease (HCC); Herpes; Mixed hyperlipidemia; Supervision of high risk pregnancy, antepartum; Grand multiparity, antepartum; and HIV disease affecting pregnancy in first trimester on their problem list.  Patient reports no complaints.  Contractions: Irritability. Vag. Bleeding: None.  Movement: Present. Denies leaking of fluid.   The following portions of the patient's history were reviewed and updated as appropriate: allergies, current medications, past family history, past medical history, past social history, past surgical history and problem list.   Objective:   Vitals:   04/07/22 0959  BP: 110/75  Pulse: 93  Weight: 164 lb (74.4 kg)    Fetal Status: Fetal Heart Rate (bpm): 146 Fundal Height: 28 cm Movement: Present     General:  Alert, oriented and cooperative. Patient is in no acute distress.  Skin: Skin is warm and dry. No rash noted.   Cardiovascular: Normal heart rate noted  Respiratory: Normal respiratory effort, no problems with respiration noted  Abdomen: Soft, gravid, appropriate for gestational age.  Pain/Pressure: Absent     Pelvic: Cervical exam deferred        Extremities: Normal range of motion.  Edema: Trace  Mental Status: Normal mood and affect. Normal behavior. Normal judgment and thought content.   Assessment and Plan:  Pregnancy: G6P5005 at [redacted]w[redacted]d 1. Need for Tdap vaccination - Tdap vaccine greater than or equal to 7yo IM  2. Supervision of high risk pregnancy, antepartum Rh pos MOC: [place]. - she knows she can't do this until about 4w PP She has f/u US on 12/22.  28 week labs today  3. Human immunodeficiency virus (HIV) disease (HCC) On her ART  4. Herpes Valtrex at 36w  5. Grand  multiparity, antepartum  6. Moderate persistent asthma with acute exacerbation - Refill her inhaler.  - albuterol (VENTOLIN HFA) 108 (90 Base) MCG/ACT inhaler; Inhale 2 puffs into the lungs every 6 (six) hours as needed for wheezing or shortness of breath.  Dispense: 1 each; Refill: 2  Preterm labor symptoms and general obstetric precautions including but not limited to vaginal bleeding, contractions, leaking of fluid and fetal movement were reviewed in detail with the patient. Please refer to After Visit Summary for other counseling recommendations.   No follow-ups on file.  Future Appointments  Date Time Provider Department Center  04/10/2022  3:30 PM [facility] NURSE [facility]  04/10/2022  3:45 PM [facility] US1 [facility]  04/22/2022  1:30 PM Reva Bores, MD [facility]  05/05/2022  1:30 PM [place] Bing, MD [facility]  05/19/2022  1:30 PM [place] Bing, MD [facility]  05/27/2022  1:30 PM Federico Flake, MD [facility]  06/03/2022  1:30 PM Reva Bores, MD [facility]  06/11/2022  1:50 PM Calvert Cantor, CNM [facility]  06/18/2022  1:30 PM [place] Bing, MD [facility]    Milas Hock, MD

## 2022-04-08 LAB — CBC
Hematocrit: 31.2 % — ABNORMAL LOW (ref 34.0–46.6)
Hemoglobin: 10.3 g/dL — ABNORMAL LOW (ref 11.1–15.9)
MCH: 26.9 pg (ref 26.6–33.0)
MCHC: 33 g/dL (ref 31.5–35.7)
MCV: 82 fL (ref 79–97)
Platelets: 235 10*3/uL (ref 150–450)
RBC: 3.83 x10E6/uL (ref 3.77–5.28)
RDW: 13.6 % (ref 11.7–15.4)
WBC: 11.3 10*3/uL — ABNORMAL HIGH (ref 3.4–10.8)

## 2022-04-08 LAB — RPR: RPR Ser Ql: NONREACTIVE

## 2022-04-08 LAB — GLUCOSE TOLERANCE, 2 HOURS W/ 1HR
Glucose, 1 hour: 125 mg/dL (ref 70–179)
Glucose, 2 hour: 129 mg/dL (ref 70–152)
Glucose, Fasting: 79 mg/dL (ref 70–91)

## 2022-04-08 LAB — HIV 1/2 AB DIFFERENTIATION
HIV 1 Ab: REACTIVE
HIV 2 Ab: NONREACTIVE
NOTE (HIV CONF MULTIP: POSITIVE — AB

## 2022-04-08 LAB — HIV ANTIBODY (ROUTINE TESTING W REFLEX): HIV Screen 4th Generation wRfx: REACTIVE

## 2022-04-09 ENCOUNTER — Other Ambulatory Visit: Payer: Self-pay | Admitting: Obstetrics & Gynecology

## 2022-04-09 DIAGNOSIS — J4541 Moderate persistent asthma with (acute) exacerbation: Secondary | ICD-10-CM

## 2022-04-09 MED ORDER — ALBUTEROL SULFATE HFA 108 (90 BASE) MCG/ACT IN AERS: 2.0000 | INHALATION_SPRAY | Freq: Four times a day (QID) | RESPIRATORY_TRACT | 3 refills | Status: DC | PRN

## 2022-04-10 ENCOUNTER — Ambulatory Visit: Payer: Medicaid Other | Admitting: *Deleted

## 2022-04-10 ENCOUNTER — Ambulatory Visit: Payer: Medicaid Other | Attending: Obstetrics

## 2022-04-10 VITALS — BP 115/62 | HR 95

## 2022-04-10 DIAGNOSIS — E669 Obesity, unspecified: Secondary | ICD-10-CM

## 2022-04-10 DIAGNOSIS — O98713 Human immunodeficiency virus [HIV] disease complicating pregnancy, third trimester: Secondary | ICD-10-CM | POA: Diagnosis not present

## 2022-04-10 DIAGNOSIS — Z3A28 28 weeks gestation of pregnancy: Secondary | ICD-10-CM | POA: Diagnosis not present

## 2022-04-10 DIAGNOSIS — O99513 Diseases of the respiratory system complicating pregnancy, third trimester: Secondary | ICD-10-CM | POA: Diagnosis not present

## 2022-04-10 DIAGNOSIS — J45909 Unspecified asthma, uncomplicated: Secondary | ICD-10-CM

## 2022-04-10 DIAGNOSIS — O99013 Anemia complicating pregnancy, third trimester: Secondary | ICD-10-CM

## 2022-04-10 DIAGNOSIS — Z362 Encounter for other antenatal screening follow-up: Secondary | ICD-10-CM | POA: Diagnosis not present

## 2022-04-10 DIAGNOSIS — O99213 Obesity complicating pregnancy, third trimester: Secondary | ICD-10-CM | POA: Diagnosis not present

## 2022-04-10 DIAGNOSIS — O0990 Supervision of high risk pregnancy, unspecified, unspecified trimester: Secondary | ICD-10-CM | POA: Insufficient documentation

## 2022-04-10 DIAGNOSIS — O98719 Human immunodeficiency virus [HIV] disease complicating pregnancy, unspecified trimester: Secondary | ICD-10-CM | POA: Insufficient documentation

## 2022-04-10 DIAGNOSIS — O0943 Supervision of pregnancy with grand multiparity, third trimester: Secondary | ICD-10-CM | POA: Diagnosis not present

## 2022-04-10 HISTORY — DX: Anemia complicating pregnancy, third trimester: O99.013

## 2022-04-10 MED ORDER — FERROUS SULFATE 325 (65 FE) MG PO TABS: 325.0000 mg | ORAL_TABLET | ORAL | 3 refills | Status: DC

## 2022-04-10 MED ORDER — BUDESONIDE-FORMOTEROL FUMARATE 80-4.5 MCG/ACT IN AERO: 2.0000 | INHALATION_SPRAY | Freq: Two times a day (BID) | RESPIRATORY_TRACT | 12 refills | Status: DC

## 2022-04-15 ENCOUNTER — Other Ambulatory Visit: Payer: Self-pay | Admitting: *Deleted

## 2022-04-15 ENCOUNTER — Encounter: Payer: Self-pay | Admitting: Obstetrics and Gynecology

## 2022-04-15 DIAGNOSIS — O09893 Supervision of other high risk pregnancies, third trimester: Secondary | ICD-10-CM

## 2022-04-15 DIAGNOSIS — O0943 Supervision of pregnancy with grand multiparity, third trimester: Secondary | ICD-10-CM

## 2022-04-15 DIAGNOSIS — Z3689 Encounter for other specified antenatal screening: Secondary | ICD-10-CM

## 2022-04-20 NOTE — L&D Delivery Note (Cosign Needed Addendum)
LABOR COURSE Patient admitted for SROM at 0515. Her labor was augmented with pitocin. She received intrapartum zidovudine. She progressed to complete on exam at 1647.  Delivery Note Called to room and patient was complete and pushing. Head delivered across intact perineum, LOA. Body cord present which was delivered through. Shoulder and body delivered in usual fashion. At 1654 a viable female was delivered via Vaginal, Spontaneous (Presentation: LOA).  Infant with spontaneous cry, placed on mother's abdomen, dried and stimulated. TXA administered due to risk of maternal hemorrhage given parity. Cord clamped x 2 after 1-minute delay, and cut by mother of baby. Cord blood drawn. Placenta delivered spontaneously with gentle cord traction. Appears intact. Fundus firm with massage and Pitocin. Labia, perineum, vagina, and cervix inspected without laceration and good hemostasis. Cervical prolapse noted on post-placental exam; patient reports prior history of same finding during last delivery that required weekly pelvic floor PT.    APGAR: 8,9 ; weight  pending   Cord: 3VC without complications  Anesthesia:  Epidural Episiotomy: None Lacerations: none Suture Repair:  n/a Est. Blood Loss (mL): 171m  Mom to postpartum.  Baby to Couplet care / Skin to Skin.  RDarlen Round MD PGY-1 06/16/2022 5:10 PM     GME ATTESTATION:  I saw and evaluated the patient. I agree with the findings and the plan of care as documented in the resident's note. I have made changes to documentation as necessary.  SGerlene Fee DO OB Fellow, FKeizerfor WLakeside2/27/2024, 6:18 PM

## 2022-04-22 ENCOUNTER — Encounter: Payer: Medicaid Other | Admitting: Family Medicine

## 2022-04-27 ENCOUNTER — Encounter (HOSPITAL_COMMUNITY): Payer: Self-pay | Admitting: Family Medicine

## 2022-04-27 ENCOUNTER — Ambulatory Visit (INDEPENDENT_AMBULATORY_CARE_PROVIDER_SITE_OTHER): Payer: Medicaid Other | Admitting: Obstetrics and Gynecology

## 2022-04-27 ENCOUNTER — Inpatient Hospital Stay (HOSPITAL_COMMUNITY)
Admission: AD | Admit: 2022-04-27 | Discharge: 2022-04-27 | Disposition: A | Discharge status: Home / Self Care | Payer: Medicaid Other | Attending: Family Medicine | Admitting: Family Medicine

## 2022-04-27 ENCOUNTER — Other Ambulatory Visit: Payer: Self-pay

## 2022-04-27 VITALS — BP 109/75 | HR 98 | Wt 165.0 lb

## 2022-04-27 DIAGNOSIS — Z3A31 31 weeks gestation of pregnancy: Secondary | ICD-10-CM

## 2022-04-27 DIAGNOSIS — O98711 Human immunodeficiency virus [HIV] disease complicating pregnancy, first trimester: Secondary | ICD-10-CM

## 2022-04-27 DIAGNOSIS — O0990 Supervision of high risk pregnancy, unspecified, unspecified trimester: Secondary | ICD-10-CM

## 2022-04-27 DIAGNOSIS — B009 Herpesviral infection, unspecified: Secondary | ICD-10-CM

## 2022-04-27 DIAGNOSIS — O99013 Anemia complicating pregnancy, third trimester: Secondary | ICD-10-CM

## 2022-04-27 DIAGNOSIS — O368130 Decreased fetal movements, third trimester, not applicable or unspecified: Secondary | ICD-10-CM | POA: Diagnosis not present

## 2022-04-27 DIAGNOSIS — O0940 Supervision of pregnancy with grand multiparity, unspecified trimester: Secondary | ICD-10-CM

## 2022-04-27 DIAGNOSIS — O0993 Supervision of high risk pregnancy, unspecified, third trimester: Secondary | ICD-10-CM | POA: Diagnosis not present

## 2022-04-27 DIAGNOSIS — Z21 Asymptomatic human immunodeficiency virus [HIV] infection status: Secondary | ICD-10-CM | POA: Insufficient documentation

## 2022-04-27 DIAGNOSIS — O98713 Human immunodeficiency virus [HIV] disease complicating pregnancy, third trimester: Secondary | ICD-10-CM

## 2022-04-27 DIAGNOSIS — O368131 Decreased fetal movements, third trimester, fetus 1: Secondary | ICD-10-CM | POA: Diagnosis not present

## 2022-04-27 NOTE — Progress Notes (Signed)
PRENATAL VISIT NOTE  Subjective:  Erica Walton is a 27 y.o. G6P5005 at [redacted]w[redacted]d being seen today for ongoing prenatal care.  She is currently monitored for the following issues for this high-risk pregnancy and has Asthma; Human immunodeficiency virus (HIV) disease ([place]); Herpes; Mixed hyperlipidemia; Supervision of high risk pregnancy, antepartum; Grand multiparity, antepartum; HIV disease affecting pregnancy in first trimester; and Anemia of pregnancy in third trimester on their problem list.  Patient reports  decreased fetal movement today .  Contractions: Not present. Vag. Bleeding: None.  Movement: (!) Decreased. Denies leaking of fluid.   The following portions of the patient's history were reviewed and updated as appropriate: allergies, current medications, past family history, past medical history, past social history, past surgical history and problem list.   Objective:   Vitals:   04/27/22 1436  BP: 109/75  Pulse: 98  Weight: 165 lb (74.8 kg)    Fetal Status: Fetal Heart Rate (bpm): 159   Movement: (!) Decreased     General:  Alert, oriented and cooperative. Patient is in no acute distress.  Skin: Skin is warm and dry. No rash noted.   Cardiovascular: Normal heart rate noted  Respiratory: Normal respiratory effort, no problems with respiration noted  Abdomen: Soft, gravid, appropriate for gestational age.  Pain/Pressure: Present     Pelvic: Cervical exam deferred        Extremities: Normal range of motion.  Edema: None  Mental Status: Normal mood and affect. Normal behavior. Normal judgment and thought content.   Assessment and Plan:  Pregnancy: [id number] at [redacted]w[redacted]d 1. Supervision of high risk pregnancy, antepartum Ask about birth control next visit, especially if interested in BTL - Fetal nonstress test  2. Decreased fetal movements in third trimester, single or unspecified fetus Decreased fetal movement today. Feeling some movement now but not as her usual. She ate  breakfast and lunch about two hours ago.   Fetal monitoring shows minimal variability with ? 10 x 10 accels and she had one variable when I was in the room talking to her to the 120s with normal FHR baseline at 150. Given this, I told her I recommend she go to MAU for assessment for likely prolonged monitoring and a BPP; 12/22 mfm growth 41%, 1313gm, ac 56%, afi 13.6  3. HIV disease affecting pregnancy in first trimester On descovy and tivicay. Pt has missed appts with ID for follow up with last visit in March. Last VL undetectable and cd4 675. Will send message to ID re: any recs currently  4. [place] multiparity, antepartum See above  5. Herpes Start valtrex ppx at 35-36wks  Preterm labor symptoms and general obstetric precautions including but not limited to vaginal bleeding, contractions, leaking of fluid and fetal movement were reviewed in detail with the patient. Please refer to After Visit Summary for other counseling recommendations.   No follow-ups on file.  Future Appointments  Date Time Provider [place]  05/04/2022 10:30 AM [facility] NURSE [facility]  05/04/2022 10:45 AM [facility] US7 [facility]  05/05/2022  1:30 PM Aletha Halim, MD [facility]  05/19/2022  1:30 PM Aletha Halim, MD [facility]  05/27/2022  1:30 PM Caren Macadam, MD [facility]  06/03/2022  1:30 PM Donnamae Jude, MD [facility]  06/05/2022  3:15 PM [facility] NURSE [facility]  06/05/2022  3:30 PM [facility] US2 [facility]  06/11/2022  1:50 PM Darlina Rumpf, CNM [facility]  06/18/2022  1:30 PM Aletha Halim, MD [facility]  [facility]    Chapin Bing, MD

## 2022-04-27 NOTE — MAU Note (Signed)
Erica Walton is a 27 y.o. at [redacted]w[redacted]d here in MAU reporting: DFM since this morning, was in the office and had nonreactive tracing. Was sent over for monitoring and BPP. Some lower abdominal pressure but no pain. No bleeding or LOF.  Onset of complaint: today  Pain score: 0/10  Vitals:   04/27/22 1619  BP: 121/67  Pulse: 97  Resp: 16  Temp: 98.8 F (37.1 C)  SpO2: 99%     FHT:EFM applied in room  Lab orders placed from triage: none

## 2022-04-27 NOTE — MAU Provider Note (Signed)
History     CSN: [id number]  Arrival date and time: 04/27/22 1553   None     Chief Complaint  Patient presents with   Decreased Fetal Movement   HPI This is a 27 year old G6 P5-0-0-5 at 31 weeks and 2 days who was sent to the MAU from the office due to decreased fetal movement.  Pregnancy is complicated by HIV with undetectable viral load.  She was put on the monitor in the office and had minimal variability, then a deceleration.  She was sent here for prolonged monitoring.  She is having some Braxton Hicks contractions.  Since being here, fetal activity has picked up.  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    Past Surgical History:  Procedure Laterality Date   APPENDECTOMY     LAPAROSCOPIC APPENDECTOMY N/A 07/22/2019   Procedure: APPENDECTOMY LAPAROSCOPIC;  Surgeon: Herbert Pun, MD;  Location: [facility] ORS;  Service: General;  Laterality: N/A;    Family History  Problem Relation Age of Onset   Asthma Mother    Wilson's disease Mother    Obesity Father    Hypertension Father    Asthma Father    Cancer Father    Asthma Sister    Asthma Brother    Diabetes Maternal Uncle    Obesity Maternal Grandmother    Diabetes Maternal Grandmother    Asthma Maternal Grandmother    Obesity Paternal Grandfather    Hypertension Paternal Grandfather    Heart disease Paternal Grandfather    Cancer Paternal Grandfather    Breast cancer Other    Stroke Neg Hx     Social History   Tobacco Use   Smoking status: Never   Smokeless tobacco: Never  Vaping  Use   Vaping Use: Never used  Substance Use Topics   Alcohol use: Not Currently    Comment: 2018   Drug use: Never    Allergies:  Allergies  Allergen Reactions   Latex Rash   Norco [Hydrocodone-Acetaminophen] Rash   Penicillins Rash and Other (See Comments)    Has patient had a PCN reaction causing immediate rash, facial/tongue/throat swelling, SOB or lightheadedness with hypotension: yes, [id number]} Has patient had a PCN reaction causing severe rash involving mucus membranes or skin necrosis: no:[id number]} Has patient had a PCN reaction that required hospitalization no:[id number]} Has patient had a PCN reaction occurring within the last 10 years: no:[id number]} If all of the above answers are "NO", then may proceed with Cephalosporin use.     Medications Prior to Admission  Medication Sig Dispense Refill Last Dose   albuterol (VENTOLIN HFA) 108 (90 Base) MCG/ACT inhaler Inhale 2 puffs into the lungs every 6 (six) hours as needed for wheezing or shortness of breath. 1 each 3    Blood Pressure KIT  1 Device by Does not apply route once a week. 1 kit 0    budesonide-formoterol (SYMBICORT) 80-4.5 MCG/ACT inhaler Inhale 2 puffs into the lungs in the morning and at bedtime. 1 each 12    dolutegravir (TIVICAY) 50 MG tablet Take 1 tablet (50 mg total) by mouth every evening. 30 tablet 5    emtricitabine-tenofovir AF (DESCOVY) 200-25 MG tablet Take 1 tablet by mouth every evening. 30 tablet 5    ferrous sulfate (FERROUSUL) 325 (65 FE) MG tablet Take 1 tablet (325 mg total) by mouth every other day. 30 tablet 3    Prenatal MV & Min w/FA-DHA (PRENATAL GUMMIES PO) Take by mouth.       Review of Systems Physical Exam   Blood pressure 121/67, pulse 97, temperature 98.8 F (37.1 C), temperature source Oral, resp. rate 16, last menstrual period 09/10/2021, SpO2 99 %.  Physical Exam Vitals reviewed.  Constitutional:      Appearance: Normal appearance.  Abdominal:     General: Abdomen is  flat.     Palpations: Abdomen is soft.  Skin:    General: Skin is warm and dry.     Capillary Refill: Capillary refill takes less than 2 seconds.  Neurological:     General: No focal deficit present.     Mental Status: She is alert.  Psychiatric:        Mood and Affect: Mood normal.        Behavior: Behavior normal.        Thought Content: Thought content normal.        Judgment: Judgment normal.     MAU Course  Procedures NST:  Baseline: 140s  Variability: moderate Accelerations: present  Decelerations: none Contractions: none  MDM   Assessment and Plan   1. Supervision of high risk pregnancy, antepartum   2. Decreased fetal movements in third trimester, single or unspecified fetus   3. HIV disease affecting pregnancy in first trimester    Reactive NST with >20 movements, esp in the last 20 minutes. D/C to home.  Levie Heritage 04/27/2022, 5:19 PM

## 2022-04-27 NOTE — Progress Notes (Signed)
ROB [redacted]w[redacted]d  CC: not really feeling baby since the am.  Pain in Hips and back pain.

## 2022-04-28 ENCOUNTER — Other Ambulatory Visit (HOSPITAL_COMMUNITY): Payer: Self-pay

## 2022-04-30 ENCOUNTER — Telehealth: Payer: Self-pay

## 2022-04-30 NOTE — Telephone Encounter (Signed)
-----   Message from [place] Callas, NP sent at 04/30/2022  2:44 PM EST ----- Regarding: FW: HIV patient at [place] can we get this young lady scheduled for a video visit with me sometime either next week or early February?   ----- Message ----- From: Aletha Halim, MD Sent: 04/28/2022   7:26 AM EST To: [place] Callas, NP Subject: HIV patient at 31wks                           It looks like you last saw her in March of 2023 and she's missed a few follow ups with you. Her last VL was undetectable and CD4 in the 600s in September 2023 and currently on descovy and tivicay.  I just wanted to reach out to you in terms of any future recs, need for follow up, etc. Thanks!  Durene Romans MD Attending Center for [employer] (Faculty Practice) 04/28/2022

## 2022-04-30 NOTE — Telephone Encounter (Signed)
Left patient a voice mail to call back to schedule a Video visit with [employer]

## 2022-05-04 ENCOUNTER — Ambulatory Visit: Payer: Medicaid Other | Attending: Maternal & Fetal Medicine

## 2022-05-04 ENCOUNTER — Ambulatory Visit: Payer: Medicaid Other | Admitting: *Deleted

## 2022-05-04 VITALS — BP 110/60 | HR 90

## 2022-05-04 DIAGNOSIS — O99513 Diseases of the respiratory system complicating pregnancy, third trimester: Secondary | ICD-10-CM

## 2022-05-04 DIAGNOSIS — O09893 Supervision of other high risk pregnancies, third trimester: Secondary | ICD-10-CM | POA: Insufficient documentation

## 2022-05-04 DIAGNOSIS — J45909 Unspecified asthma, uncomplicated: Secondary | ICD-10-CM | POA: Diagnosis not present

## 2022-05-04 DIAGNOSIS — O99213 Obesity complicating pregnancy, third trimester: Secondary | ICD-10-CM | POA: Diagnosis not present

## 2022-05-04 DIAGNOSIS — O0990 Supervision of high risk pregnancy, unspecified, unspecified trimester: Secondary | ICD-10-CM

## 2022-05-04 DIAGNOSIS — O0943 Supervision of pregnancy with grand multiparity, third trimester: Secondary | ICD-10-CM | POA: Diagnosis not present

## 2022-05-04 DIAGNOSIS — Z3689 Encounter for other specified antenatal screening: Secondary | ICD-10-CM | POA: Insufficient documentation

## 2022-05-04 DIAGNOSIS — Z3A32 32 weeks gestation of pregnancy: Secondary | ICD-10-CM

## 2022-05-04 DIAGNOSIS — E669 Obesity, unspecified: Secondary | ICD-10-CM

## 2022-05-04 DIAGNOSIS — B20 Human immunodeficiency virus [HIV] disease: Secondary | ICD-10-CM

## 2022-05-04 DIAGNOSIS — O98713 Human immunodeficiency virus [HIV] disease complicating pregnancy, third trimester: Secondary | ICD-10-CM | POA: Diagnosis not present

## 2022-05-04 DIAGNOSIS — Z362 Encounter for other antenatal screening follow-up: Secondary | ICD-10-CM

## 2022-05-05 ENCOUNTER — Telehealth: Payer: Self-pay

## 2022-05-05 ENCOUNTER — Telehealth (INDEPENDENT_AMBULATORY_CARE_PROVIDER_SITE_OTHER): Payer: Medicaid Other | Admitting: Obstetrics and Gynecology

## 2022-05-05 DIAGNOSIS — O0990 Supervision of high risk pregnancy, unspecified, unspecified trimester: Secondary | ICD-10-CM

## 2022-05-05 DIAGNOSIS — M5430 Sciatica, unspecified side: Secondary | ICD-10-CM

## 2022-05-05 DIAGNOSIS — O0943 Supervision of pregnancy with grand multiparity, third trimester: Secondary | ICD-10-CM

## 2022-05-05 DIAGNOSIS — O98713 Human immunodeficiency virus [HIV] disease complicating pregnancy, third trimester: Secondary | ICD-10-CM

## 2022-05-05 DIAGNOSIS — O0993 Supervision of high risk pregnancy, unspecified, third trimester: Secondary | ICD-10-CM | POA: Diagnosis not present

## 2022-05-05 DIAGNOSIS — B20 Human immunodeficiency virus [HIV] disease: Secondary | ICD-10-CM | POA: Diagnosis not present

## 2022-05-05 DIAGNOSIS — O99013 Anemia complicating pregnancy, third trimester: Secondary | ICD-10-CM | POA: Diagnosis not present

## 2022-05-05 DIAGNOSIS — O98711 Human immunodeficiency virus [HIV] disease complicating pregnancy, first trimester: Secondary | ICD-10-CM

## 2022-05-05 DIAGNOSIS — D649 Anemia, unspecified: Secondary | ICD-10-CM | POA: Diagnosis not present

## 2022-05-05 DIAGNOSIS — B009 Herpesviral infection, unspecified: Secondary | ICD-10-CM

## 2022-05-05 DIAGNOSIS — Z3A32 32 weeks gestation of pregnancy: Secondary | ICD-10-CM | POA: Diagnosis not present

## 2022-05-05 DIAGNOSIS — O0940 Supervision of pregnancy with grand multiparity, unspecified trimester: Secondary | ICD-10-CM

## 2022-05-05 NOTE — Telephone Encounter (Signed)
-----  Message from Stephanie N Dixon, NP sent at 04/30/2022  2:44 PM EST ----- Regarding: FW: HIV patient at 31wks Hi Aileene can we get this young lady scheduled for a video visit with me sometime either next week or early February?   ----- Message ----- From: Pickens, Charlie, MD Sent: 04/28/2022   7:26 AM EST To: Stephanie N Dixon, NP Subject: HIV patient at 31wks                           It looks like you last saw her in March of 2023 and she's missed a few follow ups with you. Her last VL was undetectable and CD4 in the 600s in September 2023 and currently on descovy and tivicay.  I just wanted to reach out to you in terms of any future recs, need for follow up, etc. Thanks!  Charlie Pickens, Jr MD Attending [facility] (Faculty Practice) 04/28/2022      

## 2022-05-05 NOTE — Telephone Encounter (Signed)
Left patient a voice mail to call back to Schedule a video visit with Dixon.

## 2022-05-05 NOTE — Progress Notes (Signed)
TELEHEALTH OBSTETRICS VISIT ENCOUNTER NOTE  Provider location: [facility] for [place] [facility]   Patient location: Home  I connected with Erica Walton on 05/05/22 at  1:30 PM EST by telephone at home and verified that I am speaking with the correct person using two identifiers. Of note, unable to do video encounter due to technical difficulties.    I discussed the limitations, risks, security and privacy concerns of performing an evaluation and management service by telephone and the availability of in person appointments. I also discussed with the patient that there may be a patient responsible charge related to this service. The patient expressed understanding and agreed to proceed.  Subjective:  Erica Walton is a 27 y.o. G6P5005 at [redacted]w[redacted]d being followed for ongoing prenatal care.  She is currently monitored for the following issues for this high-risk pregnancy and has Asthma; Human immunodeficiency virus (HIV) disease ([place]); Herpes; Mixed hyperlipidemia; Supervision of high risk pregnancy, antepartum; Grand multiparity, antepartum; HIV disease affecting pregnancy in first trimester; and Anemia of pregnancy in third trimester on their problem list.  Patient reports  left sciatic pain for the past few days.  . Reports fetal movement. Denies any contractions, bleeding or leaking of fluid.   The following portions of the patient's history were reviewed and updated as appropriate: allergies, current medications, past family history, past medical history, past social history, past surgical history and problem list.   Objective:  Last menstrual period 09/10/2021. General:  Alert, oriented and cooperative.   Mental Status: Normal mood and affect perceived. Normal judgment and thought content.  Rest of physical exam deferred due to type of encounter  Assessment and Plan:  Pregnancy: G6P5005 at [redacted]w[redacted]d 1. Supervision of high risk pregnancy, antepartum Nuvaring. Pt told to  come by office anytime for new BP cuff. BP wnl yesterday F/u mfm surveillance growth u/s from yesterday  2. Sciatic nerve pain, unspecified laterality Pt to watch and let us know if desires PT referral  3. [redacted] weeks gestation of pregnancy  4. Herpes Start ppx at 34-36wks  5. HIV disease affecting pregnancy in first trimester On descovy and tivicay.ID recs repeat labs at 35-36wks Last VL undetectable and cd4 675.  6. Anemia of pregnancy in third trimester    Latest Ref Rng & Units 04/07/2022    9:22 AM 01/08/2022    2:18 PM 12/08/2021    3:16 PM  CBC  WBC 3.4 - 10.8 x10E3/uL 11.3  8.1  9.1   Hemoglobin 11.1 - 15.9 g/dL 10.3  11.6  14.0   Hematocrit 34.0 - 46.6 % 31.2  34.3  43.1   Platelets 150 - 450 x10E3/uL 235  284  355    7. Grand multiparity, antepartum  Preterm labor symptoms and general obstetric precautions including but not limited to vaginal bleeding, contractions, leaking of fluid and fetal movement were reviewed in detail with the patient.  I discussed the assessment and treatment plan with the patient. The patient was provided an opportunity to ask questions and all were answered. The patient agreed with the plan and demonstrated an understanding of the instructions. The patient was advised to call back or seek an in-person office evaluation/go to MAU at [facility] for any urgent or concerning symptoms. Please refer to After Visit Summary for other counseling recommendations.   I provided 10 minutes of non-face-to-face time during this encounter.  No follow-ups on file.  Future Appointments  Date Time Provider [place]  05/19/2022  1:30 PM Aletha Halim, MD [facility]  05/25/2022  2:30 PM  Callas, NP [facility]  05/27/2022  1:30 PM Caren Macadam, MD [facility]  06/03/2022  1:30 PM Donnamae Jude, MD [facility]  06/05/2022  3:15 PM [facility] NURSE [facility]  06/05/2022  3:30 PM [facility] US2  [facility]  06/11/2022  1:50 PM Darlina Rumpf, CNM [facility]  06/18/2022  1:30 PM Aletha Halim, MD [facility]    Aletha Halim, [place] for [facility], [place]

## 2022-05-13 ENCOUNTER — Encounter: Payer: Self-pay | Admitting: Obstetrics and Gynecology

## 2022-05-14 ENCOUNTER — Encounter: Payer: Self-pay | Admitting: *Deleted

## 2022-05-18 ENCOUNTER — Encounter: Payer: Self-pay | Admitting: Obstetrics and Gynecology

## 2022-05-19 ENCOUNTER — Encounter: Payer: Medicaid Other | Admitting: Obstetrics and Gynecology

## 2022-05-20 ENCOUNTER — Encounter: Payer: Self-pay | Admitting: Obstetrics and Gynecology

## 2022-05-21 ENCOUNTER — Other Ambulatory Visit: Payer: Self-pay | Admitting: Obstetrics & Gynecology

## 2022-05-23 ENCOUNTER — Encounter: Payer: Self-pay | Admitting: Family Medicine

## 2022-05-25 ENCOUNTER — Encounter: Payer: Self-pay | Admitting: Infectious Diseases

## 2022-05-25 ENCOUNTER — Telehealth (INDEPENDENT_AMBULATORY_CARE_PROVIDER_SITE_OTHER): Payer: Medicaid Other | Admitting: Infectious Diseases

## 2022-05-25 ENCOUNTER — Other Ambulatory Visit: Payer: Self-pay

## 2022-05-25 DIAGNOSIS — Z3A35 35 weeks gestation of pregnancy: Secondary | ICD-10-CM | POA: Diagnosis not present

## 2022-05-25 DIAGNOSIS — O98713 Human immunodeficiency virus [HIV] disease complicating pregnancy, third trimester: Secondary | ICD-10-CM | POA: Diagnosis not present

## 2022-05-25 DIAGNOSIS — B20 Human immunodeficiency virus [HIV] disease: Secondary | ICD-10-CM

## 2022-05-25 DIAGNOSIS — O09893 Supervision of other high risk pregnancies, third trimester: Secondary | ICD-10-CM

## 2022-05-25 NOTE — Progress Notes (Signed)
Name: Erica Walton  DOB: 1995-11-20 MRN: [id number] PCP: [name], NP   VIRTUAL CARE ENCOUNTER  I connected with Erica Walton on 05/25/22 at  2:30 PM EST by VIDEO and verified that I am speaking with the correct person using two identifiers.   I discussed the limitations, risks, security and privacy concerns of performing an evaluation and management service by telephone and the availability of in person appointments. I also discussed with the patient that there may be a patient responsible charge related to this service. The patient expressed understanding and agreed to proceed.  Patient Location: [place] residence   Other Participants:   Provider Location: [facility] Office    Brief Narrative:  Erica Walton is a 27 y.o. female with well controlled HIV, Dx 2013 with routine pregnancy testing. Has always been on treatment and previously in care through [facility].  CD4 nadir 300  HIV Risk: heterosexual  History of OIs: none known  Intake Labs 06/2019: Hep B sAg (-), sAb (+), cAb (-); Hep A (-), Hep C (-) Quantiferon (-) HLA B*5701 (-) G6PD: ()   Previous Regimens: Truvada + Prezista/Ritonovir Stribild Genvoya --> undetectable  Tivicay + Truvada during pregnancy 2021 - 2022 [place] + Truvada 03/2021 with desire of pregnancy    Genotypes: 2013 - [id number]     Subjective:   Chief Complaint  Patient presents with   Follow-up    B20        HPI: She is doing very well. Continues on Tiivcay + Descovy once a day for HIV treatment with last VL < 20 in September 2023.  She spoke with pediatric team plans for breastfeeding again. Looking forward to trying to breastfeet.  Planning on discussing with GYN team about induction details to help organize for work leave.  No changes to medical history since our last OV together.    Review of Systems  Constitutional:  Negative for chills and fever.  HENT:  Negative for tinnitus.   Eyes:  Negative for  blurred vision and photophobia.  Respiratory:  Negative for cough and sputum production.   Cardiovascular:  Negative for chest pain.  Gastrointestinal:  Negative for diarrhea, nausea and vomiting.  Genitourinary:  Negative for dysuria.  Skin:  Negative for rash.  Neurological:  Negative for headaches.     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 30 tablet 5   emtricitabine-tenofovir AF (DESCOVY) 200-25 MG tablet Take 1 tablet by mouth every evening. 30 tablet 5   Prenatal MV & Min w/FA-DHA (PRENATAL GUMMIES PO) Take by mouth.     Prenatal Vit-Fe Phos-FA-Omega (VITAFOL GUMMIES) 3.33-0.333-34.8 MG CHEW CHEW 3 TABLETS BY MOUTH DAILY. 90 tablet 5   albuterol (VENTOLIN HFA) 108 (90 Base) MCG/ACT inhaler Inhale 2 puffs into the lungs every 6 (six) hours as needed for wheezing or shortness of breath. (Patient not taking: Reported on 05/25/2022) 1 each 3   Blood Pressure KIT 1 Device by Does not apply route once a week. (Patient not taking: Reported on 05/25/2022) 1  kit 0   budesonide-formoterol (SYMBICORT) 80-4.5 MCG/ACT inhaler Inhale 2 puffs into the lungs in the morning and at bedtime. (Patient not taking: Reported on 05/25/2022) 1 each 12   ferrous sulfate (FERROUSUL) 325 (65 FE) MG tablet Take 1 tablet (325 mg total) by mouth every other day. (Patient not taking: Reported on 05/25/2022) 30 tablet 3   No facility-administered medications prior to visit.     Allergies  Allergen Reactions   Latex Rash   Norco [Hydrocodone-Acetaminophen] Rash   Penicillins Rash and Other (See Comments)    Has  patient had a PCN reaction causing immediate rash, facial/tongue/throat swelling, SOB or lightheadedness with hypotension: yes, [id number]} Has patient had a PCN reaction causing severe rash involving mucus membranes or skin necrosis: no:[id number]} Has patient had a PCN reaction that required hospitalization no:[id number]} Has patient had a PCN reaction occurring within the last 10 years: no:[id number]} If all of the above answers are "NO", then may proceed with Cephalosporin use.     Social History   Tobacco Use   Smoking status: Never   Smokeless tobacco: Never  Vaping Use   Vaping Use: Never used  Substance Use Topics   Alcohol use: Not Currently    Comment: 2018   Drug use: Never    Social History   Substance and Sexual Activity  Sexual Activity Yes   Birth control/protection: None   Comment: DECLINED CONDOMS     Objective:   There were no vitals filed for this visit.  There is no height or weight on file to calculate BMI.    Physical Exam Constitutional:      Appearance: Normal appearance. She is not ill-appearing.  HENT:     Mouth/Throat:     Mouth: Mucous membranes are moist.     Pharynx: Oropharynx is clear.  Eyes:     General: No scleral icterus. Cardiovascular:     Rate and Rhythm: Normal rate and regular rhythm.  Pulmonary:     Effort: Pulmonary effort is normal.  Neurological:     Mental Status: She is oriented to person, place, and time.  Psychiatric:        Mood and Affect: Mood normal.        Thought Content: Thought content normal.     Lab Results Lab Results  Component Value Date   WBC 11.3 (H) 04/07/2022   HGB 10.3 (L) 04/07/2022   HCT 31.2 (L) 04/07/2022   MCV 82 04/07/2022   PLT 235 04/07/2022    Lab Results  Component Value Date   CREATININE 0.86 07/20/2021   BUN 14 07/20/2021   NA 140 07/20/2021   K 3.6 07/20/2021   CL 106 07/20/2021   CO2 24 07/20/2021    Lab Results  Component Value Date   ALT 11 06/16/2021   AST 18  06/16/2021   ALKPHOS 125 (H) 06/16/2021   BILITOT 0.3 06/16/2021    Lab Results  Component Value Date   CHOL 202 (H) 06/16/2021   HDL 40 06/16/2021   LDLCALC 133 (H) 06/16/2021   TRIG 161 (H) 06/16/2021   CHOLHDL 5.1 (H) 06/16/2021   HIV 1 RNA Quant (Copies/mL)  Date Value  06/25/2021 Not Detected  01/27/2021 <20 (H)  11/25/2020 Not Detected   HIV-1 RNA Viral Load (copies/mL)  Date Value  01/08/2022 <20  03/06/2020 <20   CD4 T Cell Abs (/uL)  Date Value  01/27/2021 990  09/23/2020 857  05/02/2020 948     Assessment & Plan:  Patient Active Problem List   Diagnosis Date Noted   Anemia of pregnancy in third trimester 04/10/2022   Grand multiparity, antepartum 12/08/2021   HIV (human immunodeficiency virus) risk factors complicating pregnancy, third trimester 12/08/2021   Supervision of high risk pregnancy, antepartum 11/17/2021   Mixed hyperlipidemia 06/23/2021   Herpes 10/06/2013   Asthma 11/17/2011   Human immunodeficiency virus (HIV) disease ([place]) 11/17/2011    Problem List Items Addressed This Visit       Unprioritized   Human immunodeficiency virus (HIV) disease ([place]) - Primary    Very well controlled on once daily Tivicay + Descovy taken together. No concerns with access or adherence to medication. They are tolerating the medication well without side effects. No drug interactions identified. Pertinent lab tests ordered today.  No insurance changes today.  No dental needs today.  No concern over anxious/depressed mood.  Sexual health and family planning discussed - currently [redacted]w[redacted]d gestation with 5th child. Plans for induction noted. VL to be checked at [facility] office visit this week. .  Vaccines deferred for now - received TDap at 41L, uncertain if she has received RSV - will ask her OB team about that.   FU viral load recommended at first post partum check 4-6 weeks to ensure still undetectable. She can come see me again in the summer sometime around June.          HIV (human immunodeficiency virus) risk factors complicating pregnancy, third trimester    Discussed breastfeeding recommendations today - encouraged ongoing daily adherence to ARTs, which she does a wonderful job at. She is a great candidate for breastfeeding.  She is working with ID peds team that she has worked with before during previous pregnancies/deliveries. She is excited to try breastfeeding again. Discussed exclusive breastfeeding and to avoid doing so with bloody/cracked nipples, avoiding breastfeeding with mastitis.        Janene Madeira, MSN, NP-C [facility] for [facility] [place] Pager: [phone] Office: [phone]  05/25/22  2:53 PM

## 2022-05-25 NOTE — Assessment & Plan Note (Signed)
Discussed breastfeeding recommendations today - encouraged ongoing daily adherence to ARTs, which she does a wonderful job at. She is a great candidate for breastfeeding.  She is working with ID peds team that she has worked with before during previous pregnancies/deliveries. She is excited to try breastfeeding again. Discussed exclusive breastfeeding and to avoid doing so with bloody/cracked nipples, avoiding breastfeeding with mastitis.

## 2022-05-25 NOTE — Assessment & Plan Note (Signed)
Very well controlled on once daily Tivicay + Descovy taken together. No concerns with access or adherence to medication. They are tolerating the medication well without side effects. No drug interactions identified. Pertinent lab tests ordered today.  No insurance changes today.  No dental needs today.  No concern over anxious/depressed mood.  Sexual health and family planning discussed - currently [redacted]w[redacted]d gestation with 5th child. Plans for induction noted. VL to be checked at [facility] this week. .  Vaccines deferred for now - received TDap at 17R, uncertain if she has received RSV - will ask her OB team about that.   FU viral load recommended at first post partum check 4-6 weeks to ensure still undetectable. She can come see me again in the summer sometime around June.

## 2022-05-27 ENCOUNTER — Ambulatory Visit (INDEPENDENT_AMBULATORY_CARE_PROVIDER_SITE_OTHER): Payer: Medicaid Other | Admitting: Family Medicine

## 2022-05-27 VITALS — BP 113/72 | HR 90 | Wt 168.4 lb

## 2022-05-27 DIAGNOSIS — B009 Herpesviral infection, unspecified: Secondary | ICD-10-CM

## 2022-05-27 DIAGNOSIS — O0943 Supervision of pregnancy with grand multiparity, third trimester: Secondary | ICD-10-CM

## 2022-05-27 DIAGNOSIS — O09893 Supervision of other high risk pregnancies, third trimester: Secondary | ICD-10-CM

## 2022-05-27 DIAGNOSIS — O99013 Anemia complicating pregnancy, third trimester: Secondary | ICD-10-CM

## 2022-05-27 DIAGNOSIS — O0993 Supervision of high risk pregnancy, unspecified, third trimester: Secondary | ICD-10-CM

## 2022-05-27 DIAGNOSIS — Z3A35 35 weeks gestation of pregnancy: Secondary | ICD-10-CM

## 2022-05-27 DIAGNOSIS — O0940 Supervision of pregnancy with grand multiparity, unspecified trimester: Secondary | ICD-10-CM

## 2022-05-27 DIAGNOSIS — O0990 Supervision of high risk pregnancy, unspecified, unspecified trimester: Secondary | ICD-10-CM

## 2022-05-27 MED ORDER — VALACYCLOVIR HCL 500 MG PO TABS: 500.0000 mg | ORAL_TABLET | Freq: Two times a day (BID) | ORAL | 6 refills | Status: DC

## 2022-05-27 NOTE — Progress Notes (Signed)
CC: routine OB visit

## 2022-05-27 NOTE — Progress Notes (Signed)
   PRENATAL VISIT NOTE  Subjective:  Erica Walton is a 27 y.o. G6P5005 at [redacted]w[redacted]d being seen today for ongoing prenatal care.  She is currently monitored for the following issues for this high-risk pregnancy and has Asthma; Human immunodeficiency virus (HIV) disease ([place]); Herpes; Mixed hyperlipidemia; Supervision of high risk pregnancy, antepartum; Grand multiparity, antepartum; HIV (human immunodeficiency virus) risk factors complicating pregnancy, third trimester; and Anemia of pregnancy in third trimester on their problem list.  Patient reports no complaints.  Contractions: Not present. Vag. Bleeding: None.  Movement: Present. Denies leaking of fluid.   The following portions of the patient's history were reviewed and updated as appropriate: allergies, current medications, past family history, past medical history, past social history, past surgical history and problem list.   Objective:   Vitals:   05/27/22 1348  BP: 113/72  Pulse: 90  Weight: 168 lb 6.4 oz (76.4 kg)    Fetal Status: Fetal Heart Rate (bpm): 136   Movement: Present     General:  Alert, oriented and cooperative. Patient is in no acute distress.  Skin: Skin is warm and dry. No rash noted.   Cardiovascular: Normal heart rate noted  Respiratory: Normal respiratory effort, no problems with respiration noted  Abdomen: Soft, gravid, appropriate for gestational age.  Pain/Pressure: Present     Pelvic: Cervical exam deferred        Extremities: Normal range of motion.  Edema: None  Mental Status: Normal mood and affect. Normal behavior. Normal judgment and thought content.   Assessment and Plan:  Pregnancy: L2G4010 at [redacted]w[redacted]d 1. [place] multiparity, antepartum High PPH risk  2. HIV (human immunodeficiency virus) risk factors complicating pregnancy, third trimester - Has appt with ID next week - Needs VL 35-36 weeks and getting today - HIV 1 RNA quant-no reflex-bld - plan to scheduled IOL at next appt for 3/2  ([redacted]w[redacted]d) - Planning on breastfeeding   3. Supervision of high risk pregnancy, antepartum FH appropriate Vertex Planning breastfeeding- Adult and peds ID are aware and supportive. Her ID physician has met with the [facility] team/leader to review protocol.  Desires Nuvaring for contraception, desires at least 5 years before next child  4. Anemia of pregnancy in third trimester Last HGB 10.3  5. Herpes Start PPX today  Preterm labor symptoms and general obstetric precautions including but not limited to vaginal bleeding, contractions, leaking of fluid and fetal movement were reviewed in detail with the patient. Please refer to After Visit Summary for other counseling recommendations.   Return in about 1 week (around 06/03/2022) for Routine prenatal care.  Future Appointments  Date Time Provider [place]  06/03/2022  1:30 PM Donnamae Jude, MD [facility]  06/05/2022  3:15 PM [facility] NURSE [facility]  06/05/2022  3:30 PM [facility] US2 [facility]  06/11/2022  1:50 PM Darlina Rumpf, CNM [facility]  06/18/2022  1:30 PM Aletha Halim, MD [facility]    Caren Macadam, MD

## 2022-05-28 LAB — CBC
Hematocrit: 32 % — ABNORMAL LOW (ref 34.0–46.6)
Hemoglobin: 10.2 g/dL — ABNORMAL LOW (ref 11.1–15.9)
MCH: 24.9 pg — ABNORMAL LOW (ref 26.6–33.0)
MCHC: 31.9 g/dL (ref 31.5–35.7)
MCV: 78 fL — ABNORMAL LOW (ref 79–97)
Platelets: 217 x10E3/uL (ref 150–450)
RBC: 4.1 x10E6/uL (ref 3.77–5.28)
RDW: 15.3 % (ref 11.7–15.4)
WBC: 11.4 x10E3/uL — ABNORMAL HIGH (ref 3.4–10.8)

## 2022-05-28 LAB — HIV-1 RNA QUANT-NO REFLEX-BLD: HIV-1 RNA Viral Load: <20 {copies}/mL

## 2022-06-01 ENCOUNTER — Other Ambulatory Visit: Payer: Self-pay | Admitting: *Deleted

## 2022-06-01 ENCOUNTER — Encounter: Payer: Self-pay | Admitting: Family Medicine

## 2022-06-01 MED ORDER — ONDANSETRON 4 MG PO TBDP: 4.0000 mg | ORAL_TABLET | Freq: Four times a day (QID) | ORAL | 0 refills | Status: DC | PRN

## 2022-06-01 MED ORDER — PROMETHAZINE HCL 25 MG PO TABS: 25.0000 mg | ORAL_TABLET | Freq: Four times a day (QID) | ORAL | 0 refills | Status: DC | PRN

## 2022-06-03 ENCOUNTER — Encounter: Payer: Medicaid Other | Admitting: Family Medicine

## 2022-06-05 ENCOUNTER — Ambulatory Visit: Payer: Medicaid Other

## 2022-06-05 ENCOUNTER — Ambulatory Visit: Payer: Medicaid Other | Attending: Maternal & Fetal Medicine

## 2022-06-05 ENCOUNTER — Ambulatory Visit: Payer: Medicaid Other | Admitting: *Deleted

## 2022-06-05 ENCOUNTER — Other Ambulatory Visit: Payer: Self-pay | Admitting: Maternal & Fetal Medicine

## 2022-06-05 VITALS — BP 123/66 | HR 92

## 2022-06-05 DIAGNOSIS — O0943 Supervision of pregnancy with grand multiparity, third trimester: Secondary | ICD-10-CM | POA: Diagnosis not present

## 2022-06-05 DIAGNOSIS — Z3689 Encounter for other specified antenatal screening: Secondary | ICD-10-CM | POA: Diagnosis not present

## 2022-06-05 DIAGNOSIS — B20 Human immunodeficiency virus [HIV] disease: Secondary | ICD-10-CM

## 2022-06-05 DIAGNOSIS — J45909 Unspecified asthma, uncomplicated: Secondary | ICD-10-CM | POA: Diagnosis not present

## 2022-06-05 DIAGNOSIS — O99513 Diseases of the respiratory system complicating pregnancy, third trimester: Secondary | ICD-10-CM

## 2022-06-05 DIAGNOSIS — O09893 Supervision of other high risk pregnancies, third trimester: Secondary | ICD-10-CM

## 2022-06-05 DIAGNOSIS — O0990 Supervision of high risk pregnancy, unspecified, unspecified trimester: Secondary | ICD-10-CM

## 2022-06-05 DIAGNOSIS — E669 Obesity, unspecified: Secondary | ICD-10-CM

## 2022-06-05 DIAGNOSIS — O99213 Obesity complicating pregnancy, third trimester: Secondary | ICD-10-CM

## 2022-06-05 DIAGNOSIS — Z3A36 36 weeks gestation of pregnancy: Secondary | ICD-10-CM

## 2022-06-05 DIAGNOSIS — O98713 Human immunodeficiency virus [HIV] disease complicating pregnancy, third trimester: Secondary | ICD-10-CM

## 2022-06-05 NOTE — Procedures (Signed)
Erica Walton Aug 06, 1995 [redacted]w[redacted]d Fetus A Non-Stress Test Interpretation for 06/05/22  Indication:  Fetal Tachycardia   Fetal Heart Rate A Mode: External Baseline Rate (A): 150 bpm Variability: Moderate Accelerations: 15 x 15 Decelerations: None Multiple birth?: No  Uterine Activity Mode: Toco, Palpation (pt states having discomfort when baby moving around, abd soft and no UC noted.) Resting Tone Palpated: Relaxed Resting Time: Adequate  Interpretation (Fetal Testing) Nonstress Test Interpretation: Reactive Overall Impression: Reassuring for gestational age Comments: Tracing reviewed by Dr SAscencion Dike RNC

## 2022-06-07 ENCOUNTER — Encounter: Payer: Self-pay | Admitting: Obstetrics and Gynecology

## 2022-06-11 ENCOUNTER — Encounter: Payer: Medicaid Other | Admitting: Advanced Practice Midwife

## 2022-06-15 ENCOUNTER — Encounter: Payer: Self-pay | Admitting: Family Medicine

## 2022-06-15 ENCOUNTER — Other Ambulatory Visit (HOSPITAL_COMMUNITY)
Admission: RE | Admit: 2022-06-15 | Discharge: 2022-06-15 | Disposition: A | Discharge status: Home / Self Care | Payer: Medicaid Other | Source: Ambulatory Visit | Attending: Obstetrics and Gynecology | Admitting: Obstetrics and Gynecology

## 2022-06-15 ENCOUNTER — Ambulatory Visit (INDEPENDENT_AMBULATORY_CARE_PROVIDER_SITE_OTHER): Payer: Medicaid Other | Admitting: Obstetrics and Gynecology

## 2022-06-15 VITALS — BP 110/72 | Wt 168.0 lb

## 2022-06-15 DIAGNOSIS — B009 Herpesviral infection, unspecified: Secondary | ICD-10-CM

## 2022-06-15 DIAGNOSIS — Z3493 Encounter for supervision of normal pregnancy, unspecified, third trimester: Secondary | ICD-10-CM | POA: Diagnosis not present

## 2022-06-15 DIAGNOSIS — O0940 Supervision of pregnancy with grand multiparity, unspecified trimester: Secondary | ICD-10-CM

## 2022-06-15 DIAGNOSIS — O09893 Supervision of other high risk pregnancies, third trimester: Secondary | ICD-10-CM

## 2022-06-15 DIAGNOSIS — O0943 Supervision of pregnancy with grand multiparity, third trimester: Secondary | ICD-10-CM

## 2022-06-15 DIAGNOSIS — Z3A38 38 weeks gestation of pregnancy: Secondary | ICD-10-CM | POA: Diagnosis not present

## 2022-06-15 DIAGNOSIS — O99013 Anemia complicating pregnancy, third trimester: Secondary | ICD-10-CM

## 2022-06-15 LAB — OB RESULTS CONSOLE GBS: GBS: NEGATIVE

## 2022-06-15 LAB — OB RESULTS CONSOLE GC/CHLAMYDIA
Chlamydia: NEGATIVE
Neisseria Gonorrhea: NEGATIVE

## 2022-06-15 NOTE — Progress Notes (Unsigned)
CC: having tons of pelvic pressure

## 2022-06-16 ENCOUNTER — Other Ambulatory Visit: Payer: Self-pay

## 2022-06-16 ENCOUNTER — Encounter: Payer: Self-pay | Admitting: Obstetrics and Gynecology

## 2022-06-16 ENCOUNTER — Inpatient Hospital Stay (HOSPITAL_COMMUNITY)
Admission: AD | Admit: 2022-06-16 | Discharge: 2022-06-17 | DRG: 806 | Disposition: A | Discharge status: Home / Self Care | Payer: Medicaid Other | Attending: Family Medicine | Admitting: Family Medicine

## 2022-06-16 ENCOUNTER — Encounter (HOSPITAL_COMMUNITY): Payer: Self-pay | Admitting: Obstetrics and Gynecology

## 2022-06-16 ENCOUNTER — Inpatient Hospital Stay (HOSPITAL_COMMUNITY): Payer: Medicaid Other | Admitting: Anesthesiology

## 2022-06-16 DIAGNOSIS — Z880 Allergy status to penicillin: Secondary | ICD-10-CM | POA: Diagnosis not present

## 2022-06-16 DIAGNOSIS — O0990 Supervision of high risk pregnancy, unspecified, unspecified trimester: Secondary | ICD-10-CM

## 2022-06-16 DIAGNOSIS — Z3A38 38 weeks gestation of pregnancy: Secondary | ICD-10-CM | POA: Diagnosis not present

## 2022-06-16 DIAGNOSIS — O9952 Diseases of the respiratory system complicating childbirth: Secondary | ICD-10-CM | POA: Diagnosis present

## 2022-06-16 DIAGNOSIS — O3443 Maternal care for other abnormalities of cervix, third trimester: Secondary | ICD-10-CM | POA: Diagnosis not present

## 2022-06-16 DIAGNOSIS — O9832 Other infections with a predominantly sexual mode of transmission complicating childbirth: Secondary | ICD-10-CM | POA: Diagnosis not present

## 2022-06-16 DIAGNOSIS — O9872 Human immunodeficiency virus [HIV] disease complicating childbirth: Secondary | ICD-10-CM | POA: Diagnosis present

## 2022-06-16 DIAGNOSIS — Z21 Asymptomatic human immunodeficiency virus [HIV] infection status: Secondary | ICD-10-CM | POA: Diagnosis present

## 2022-06-16 DIAGNOSIS — A6000 Herpesviral infection of urogenital system, unspecified: Secondary | ICD-10-CM | POA: Diagnosis not present

## 2022-06-16 DIAGNOSIS — N812 Incomplete uterovaginal prolapse: Secondary | ICD-10-CM | POA: Diagnosis not present

## 2022-06-16 DIAGNOSIS — J45909 Unspecified asthma, uncomplicated: Secondary | ICD-10-CM | POA: Diagnosis not present

## 2022-06-16 DIAGNOSIS — O4202 Full-term premature rupture of membranes, onset of labor within 24 hours of rupture: Secondary | ICD-10-CM | POA: Diagnosis not present

## 2022-06-16 DIAGNOSIS — O0940 Supervision of pregnancy with grand multiparity, unspecified trimester: Secondary | ICD-10-CM

## 2022-06-16 DIAGNOSIS — O479 False labor, unspecified: Secondary | ICD-10-CM | POA: Diagnosis present

## 2022-06-16 DIAGNOSIS — N8185 Cervical stump prolapse: Secondary | ICD-10-CM | POA: Diagnosis not present

## 2022-06-16 DIAGNOSIS — O26893 Other specified pregnancy related conditions, third trimester: Secondary | ICD-10-CM | POA: Diagnosis not present

## 2022-06-16 DIAGNOSIS — B20 Human immunodeficiency virus [HIV] disease: Secondary | ICD-10-CM

## 2022-06-16 LAB — CBC
HCT: 31.7 % — ABNORMAL LOW (ref 36.0–46.0)
Hemoglobin: 10.1 g/dL — ABNORMAL LOW (ref 12.0–15.0)
MCH: 24.9 pg — ABNORMAL LOW (ref 26.0–34.0)
MCHC: 31.9 g/dL (ref 30.0–36.0)
MCV: 78.1 fL — ABNORMAL LOW (ref 80.0–100.0)
Platelets: 215 10*3/uL (ref 150–400)
RBC: 4.06 MIL/uL (ref 3.87–5.11)
RDW: 16.6 % — ABNORMAL HIGH (ref 11.5–15.5)
WBC: 14.3 10*3/uL — ABNORMAL HIGH (ref 4.0–10.5)
nRBC: 0 % (ref 0.0–0.2)

## 2022-06-16 LAB — GROUP B STREP BY PCR: Group B strep by PCR: NEGATIVE

## 2022-06-16 LAB — TYPE AND SCREEN
ABO/RH(D): B POS
Antibody Screen: NEGATIVE

## 2022-06-16 LAB — POCT FERN TEST
POCT Fern Test: POSITIVE
POCT Fern Test: POSITIVE

## 2022-06-16 LAB — OB RESULTS CONSOLE RPR: RPR: NONREACTIVE

## 2022-06-16 LAB — RPR: RPR Ser Ql: NONREACTIVE

## 2022-06-16 MED ORDER — PHENYLEPHRINE 80 MCG/ML (10ML) SYRINGE FOR IV PUSH (FOR BLOOD PRESSURE SUPPORT): 80.0000 ug | PREFILLED_SYRINGE | INTRAVENOUS | Status: DC | PRN

## 2022-06-16 MED ORDER — CEFAZOLIN SODIUM-DEXTROSE 1-4 GM/50ML-% IV SOLN
1.0000 g | Freq: Three times a day (TID) | INTRAVENOUS | Status: DC
  Filled 2022-06-16: qty 50

## 2022-06-16 MED ORDER — FENTANYL-BUPIVACAINE-NACL 0.5-0.125-0.9 MG/250ML-% EP SOLN
12.0000 mL/h | EPIDURAL | Status: DC | PRN
  Administered 2022-06-16: 12 mL/h via EPIDURAL
  Filled 2022-06-16: qty 250

## 2022-06-16 MED ORDER — SODIUM CHLORIDE 0.9 % IV SOLN: 250.0000 mL | INTRAVENOUS | Status: DC | PRN

## 2022-06-16 MED ORDER — BENZOCAINE-MENTHOL 20-0.5 % EX AERO: 1.0000 | INHALATION_SPRAY | CUTANEOUS | Status: DC | PRN

## 2022-06-16 MED ORDER — EPHEDRINE 5 MG/ML INJ: 10.0000 mg | INTRAVENOUS | Status: DC | PRN

## 2022-06-16 MED ORDER — FLEET ENEMA 7-19 GM/118ML RE ENEM: 1.0000 | ENEMA | RECTAL | Status: DC | PRN

## 2022-06-16 MED ORDER — DIPHENHYDRAMINE HCL 25 MG PO CAPS: 25.0000 mg | ORAL_CAPSULE | Freq: Four times a day (QID) | ORAL | Status: DC | PRN

## 2022-06-16 MED ORDER — ONDANSETRON HCL 4 MG/2ML IJ SOLN: 4.0000 mg | INTRAMUSCULAR | Status: DC | PRN

## 2022-06-16 MED ORDER — ONDANSETRON HCL 4 MG PO TABS: 4.0000 mg | ORAL_TABLET | ORAL | Status: DC | PRN

## 2022-06-16 MED ORDER — PRENATAL MULTIVITAMIN CH
1.0000 | ORAL_TABLET | Freq: Every day | ORAL | Status: DC
  Administered 2022-06-17: 1 via ORAL
  Filled 2022-06-16: qty 1

## 2022-06-16 MED ORDER — SODIUM CHLORIDE 0.9% FLUSH: 3.0000 mL | Freq: Two times a day (BID) | INTRAVENOUS | Status: DC

## 2022-06-16 MED ORDER — OXYTOCIN-SODIUM CHLORIDE 30-0.9 UT/500ML-% IV SOLN
2.5000 [IU]/h | INTRAVENOUS | Status: DC
  Filled 2022-06-16: qty 500

## 2022-06-16 MED ORDER — LIDOCAINE HCL (PF) 1 % IJ SOLN
INTRAMUSCULAR | Status: DC | PRN
  Administered 2022-06-16: 5 mL via EPIDURAL
  Administered 2022-06-16: 3 mL via EPIDURAL

## 2022-06-16 MED ORDER — SODIUM CHLORIDE 0.9% FLUSH: 3.0000 mL | INTRAVENOUS | Status: DC | PRN

## 2022-06-16 MED ORDER — LACTATED RINGERS IV SOLN: INTRAVENOUS | Status: DC

## 2022-06-16 MED ORDER — DIPHENHYDRAMINE HCL 50 MG/ML IJ SOLN: 12.5000 mg | INTRAMUSCULAR | Status: DC | PRN

## 2022-06-16 MED ORDER — LACTATED RINGERS IV SOLN
500.0000 mL | Freq: Once | INTRAVENOUS | Status: AC
  Administered 2022-06-16: 500 mL via INTRAVENOUS

## 2022-06-16 MED ORDER — TRANEXAMIC ACID-NACL 1000-0.7 MG/100ML-% IV SOLN
1000.0000 mg | INTRAVENOUS | Status: AC
  Administered 2022-06-16: 1000 mg via INTRAVENOUS
  Filled 2022-06-16: qty 100

## 2022-06-16 MED ORDER — CLINDAMYCIN PHOSPHATE 900 MG/50ML IV SOLN: 900.0000 mg | Freq: Three times a day (TID) | INTRAVENOUS | Status: DC

## 2022-06-16 MED ORDER — ONDANSETRON HCL 4 MG/2ML IJ SOLN: 4.0000 mg | Freq: Four times a day (QID) | INTRAMUSCULAR | Status: DC | PRN

## 2022-06-16 MED ORDER — ACETAMINOPHEN 325 MG PO TABS: 650.0000 mg | ORAL_TABLET | ORAL | Status: DC | PRN

## 2022-06-16 MED ORDER — OXYTOCIN-SODIUM CHLORIDE 30-0.9 UT/500ML-% IV SOLN
1.0000 m[IU]/min | INTRAVENOUS | Status: DC
  Administered 2022-06-16: 2 m[IU]/min via INTRAVENOUS

## 2022-06-16 MED ORDER — DOLUTEGRAVIR SODIUM 50 MG PO TABS
50.0000 mg | ORAL_TABLET | Freq: Every evening | ORAL | Status: DC
  Administered 2022-06-16 – 2022-06-17 (×2): 50 mg via ORAL
  Filled 2022-06-16 (×3): qty 1

## 2022-06-16 MED ORDER — SIMETHICONE 80 MG PO CHEW: 80.0000 mg | CHEWABLE_TABLET | ORAL | Status: DC | PRN

## 2022-06-16 MED ORDER — SOD CITRATE-CITRIC ACID 500-334 MG/5ML PO SOLN: 30.0000 mL | ORAL | Status: DC | PRN

## 2022-06-16 MED ORDER — TERBUTALINE SULFATE 1 MG/ML IJ SOLN: 0.2500 mg | Freq: Once | INTRAMUSCULAR | Status: DC | PRN

## 2022-06-16 MED ORDER — COCONUT OIL OIL
1.0000 | TOPICAL_OIL | Status: DC | PRN
  Administered 2022-06-17: 1 via TOPICAL

## 2022-06-16 MED ORDER — EMTRICITABINE-TENOFOVIR AF 200-25 MG PO TABS
1.0000 | ORAL_TABLET | Freq: Every evening | ORAL | Status: DC
  Administered 2022-06-16 – 2022-06-17 (×2): 1 via ORAL
  Filled 2022-06-16 (×3): qty 1

## 2022-06-16 MED ORDER — CEFAZOLIN SODIUM-DEXTROSE 2-4 GM/100ML-% IV SOLN
2.0000 g | Freq: Once | INTRAVENOUS | Status: AC
  Administered 2022-06-16: 2 g via INTRAVENOUS
  Filled 2022-06-16: qty 100

## 2022-06-16 MED ORDER — WITCH HAZEL-GLYCERIN EX PADS: 1.0000 | MEDICATED_PAD | CUTANEOUS | Status: DC | PRN

## 2022-06-16 MED ORDER — ZOLPIDEM TARTRATE 5 MG PO TABS: 5.0000 mg | ORAL_TABLET | Freq: Every evening | ORAL | Status: DC | PRN

## 2022-06-16 MED ORDER — IBUPROFEN 600 MG PO TABS
600.0000 mg | ORAL_TABLET | Freq: Four times a day (QID) | ORAL | Status: DC
  Administered 2022-06-17 (×3): 600 mg via ORAL
  Filled 2022-06-16 (×3): qty 1

## 2022-06-16 MED ORDER — LACTATED RINGERS IV SOLN: 500.0000 mL | INTRAVENOUS | Status: DC | PRN

## 2022-06-16 MED ORDER — ZIDOVUDINE 10 MG/ML IV SOLN
2.0000 mg/kg | Freq: Once | INTRAVENOUS | Status: AC
  Administered 2022-06-16: 152 mg via INTRAVENOUS
  Filled 2022-06-16: qty 15.2

## 2022-06-16 MED ORDER — VALACYCLOVIR HCL 500 MG PO TABS
500.0000 mg | ORAL_TABLET | Freq: Two times a day (BID) | ORAL | Status: DC
  Administered 2022-06-16 (×2): 500 mg via ORAL
  Filled 2022-06-16 (×2): qty 1

## 2022-06-16 MED ORDER — ZIDOVUDINE 10 MG/ML IV SOLN
1.0000 mg/kg/h | INTRAVENOUS | Status: DC
  Administered 2022-06-16: 1 mg/kg/h via INTRAVENOUS
  Filled 2022-06-16 (×2): qty 40

## 2022-06-16 MED ORDER — LIDOCAINE HCL (PF) 1 % IJ SOLN: 30.0000 mL | INTRAMUSCULAR | Status: DC | PRN

## 2022-06-16 MED ORDER — SENNOSIDES-DOCUSATE SODIUM 8.6-50 MG PO TABS
2.0000 | ORAL_TABLET | ORAL | Status: DC
  Filled 2022-06-16: qty 2

## 2022-06-16 MED ORDER — ACETAMINOPHEN 325 MG PO TABS
650.0000 mg | ORAL_TABLET | ORAL | Status: DC | PRN
  Administered 2022-06-17: 650 mg via ORAL
  Filled 2022-06-16: qty 2

## 2022-06-16 MED ORDER — OXYTOCIN BOLUS FROM INFUSION
333.0000 mL | Freq: Once | INTRAVENOUS | Status: AC
  Administered 2022-06-16: 333 mL via INTRAVENOUS

## 2022-06-16 MED ORDER — DIBUCAINE (PERIANAL) 1 % EX OINT: 1.0000 | TOPICAL_OINTMENT | CUTANEOUS | Status: DC | PRN

## 2022-06-16 NOTE — Lactation Note (Signed)
This note was copied from a baby's chart. Lactation Consultation Note  Patient Name: Erica Walton [id number] Date: 06/16/2022 Age:29 hours Discussed feeding positions. Baby very sleepy at first. [facility] had to do a lot of stimulation to get baby to wake up then she had her eyes open and BFeeding well. Mom happy baby is doing so well. Newborn feeding habits, behavior, STS, I&O, positioning, support, props reviewed. Mom encouraged to feed baby 8-12 times/24 hours and with feeding cues.  Encouraged mom to call for assistance as needed.  Reason for consult: Initial assessment;Early term 37-38.6wks   Maternal Data Has patient been taught Hand Expression?: Yes Does the patient have breastfeeding experience prior to this delivery?: Yes How long did the patient breastfeed?: tried to her 27 yr old but her milk didn't come in at the hospital so she stopped BF and gave formula.  Feeding    LATCH Score Latch: Grasps breast easily, tongue down, lips flanged, rhythmical sucking.  Audible Swallowing: A few with stimulation  Type of Nipple: Everted at rest and after stimulation  Comfort (Breast/Nipple): Soft / non-tender  Hold (Positioning): Assistance needed to correctly position infant at breast and maintain latch.  LATCH Score: 8   Lactation Tools Discussed/Used    Interventions Interventions: Breast feeding basics reviewed;Adjust position;Assisted with latch;Support pillows;Skin to skin;Position options;Breast massage;Hand express;LC Services brochure;Breast compression  Discharge    Consult Status Consult Status: Follow-up Date: 06/17/22 Follow-up type: In-patient    Theodoro Kalata 06/16/2022, 10:47 PM

## 2022-06-16 NOTE — Progress Notes (Signed)
Patient declined baby scripts

## 2022-06-16 NOTE — Progress Notes (Addendum)
Labor Progress Note Erica Walton is a 27 y.o. G6P5005 at 21w3dpresented for SROM '@0515'$   S: No acute concerns.   O:  BP 112/70   Pulse 84   Temp 98 F (36.7 C) (Oral)   Resp 18   Ht '4\' 10"'$  (1.473 m)   Wt 76.2 kg   LMP 09/10/2021   SpO2 96%   BMI 35.11 kg/m  EFM: 135bpm/moderate/+accels, no decels  CVE: Dilation: 4 Effacement (%): 70 Station: -2 Presentation: Vertex Exam by:: H.Price, RN   A&P: 27y.o. GJF:[id number] for SROM @ 0515 this morning.  #Labor: Progressing slowly. Expectant management for now, consider the need for pitocin.  #Pain: Epidural #FWB: Cat I  #GBS negative PCR, received 1 dose of ancef prior to test result with shared decision making with the patient  HIV Viral load below detectable range. -Continue antivirals   HSV -Valtrex  Anemia Hgb 10. Grand multip.  -TXA @ del  SiColgate PalmoliveDO 10:02 AM

## 2022-06-16 NOTE — Progress Notes (Signed)
   PRENATAL VISIT NOTE  Subjective:  Erica Walton is a 27 y.o. G6P5005 at 51w2dbeing seen today for ongoing prenatal care.  She is currently monitored for the following issues for this high-risk pregnancy and has Asthma; [place] Herpes; Mixed hyperlipidemia; Supervision of high risk pregnancy, antepartum; Grand multiparity, antepartum; HIV (human immunodeficiency virus) risk factors complicating pregnancy, third trimester; Anemia of pregnancy in third trimester; and Uterine contractions during pregnancy on their problem list.  Patient reports  pressure and occasional contractions .  Contractions: Regular. Vag. Bleeding: None.  Movement: Present. Denies leaking of fluid.   The following portions of the patient's history were reviewed and updated as appropriate: allergies, current medications, past family history, past medical history, past social history, past surgical history and problem list.   Objective:   Vitals:   06/15/22 1610  BP: 110/72  Weight: 168 lb (76.2 kg)    Fetal Status: Fetal Heart Rate (bpm): 145 Fundal Height: 38 cm Movement: Present  Presentation: Vertex  General:  Alert, oriented and cooperative. Patient is in no acute distress.  Skin: Skin is warm and dry. No rash noted.   Cardiovascular: Normal heart rate noted  Respiratory: Normal respiratory effort, no problems with respiration noted  Abdomen: Soft, gravid, appropriate for gestational age.  Pain/Pressure: Present     Pelvic: Cervical exam performed in the presence of a chaperone Dilation: 1.5 Effacement (%): 20 Station: -2  Extremities: Normal range of motion.  Edema: None  Mental Status: Normal mood and affect. Normal behavior. Normal judgment and thought content.   Assessment and Plan:  Pregnancy: G6P5005 at 378w2d. [redacted] weeks gestation of pregnancy Nuvaring. Cephalic by bedside u/s, subjectively normal AF and +FM - Strep Gp B Culture+Rflx - Cervicovaginal  ancillary only( [place])  2. HIV (human immunodeficiency virus) risk factors complicating pregnancy, third trimester Patient set up for 3/3 AM IOL (prefers cytotec) Followed regularly by ID, last visit 2/5. VL undetectable then; continue tivicay and dscovy 2/16: 66%, 3157gm, a c96%, bpp 10/10, afi [id number]  3. Anemia of pregnancy in third trimester    Latest Ref Rng & Units 06/16/2022    6:54 AM 05/27/2022    2:28 PM 04/07/2022    9:22 AM  CBC  WBC 4.0 - 10.5 K/uL 14.3  11.4  11.3   Hemoglobin 12.0 - 15.0 g/dL 10.1  10.2  10.3   Hematocrit 36.0 - 46.0 % 31.7  32.0  31.2   Platelets 150 - 400 K/uL 215  217  235     4. Herpes Continue valtrex ppx  5. Grand multiparity, antepartum Lysteda and pitocin with third stage  Term labor symptoms and general obstetric precautions including but not limited to vaginal bleeding, contractions, leaking of fluid and fetal movement were reviewed in detail with the patient. Please refer to After Visit Summary for other counseling recommendations.   Return if symptoms worsen or fail to improve.  Future Appointments  Date Time Provider [place]3/06/2022  7:00 AM [facility] SCHED ROOM [facility] None    ChAletha HalimMD

## 2022-06-16 NOTE — MAU Note (Addendum)
.  Erica Walton is a 27 y.o. at 87w3dhere in MAU reporting: SROM 0515 large amount of clear fluid-felt contractions after SROM. Denies bleeding or bloody show. Endorses + fetal movement. Pt reports she is HIV + with undectable viral load HSV +-denies lesions or symptoms-external inspection-no lesions or areas of redness  Onset of complaint: 0515  Pain score: 6 lower abdomen and vagina Vitals:   06/16/22 0620  Resp: 17  Temp: 98 F (36.7 C)  SpO2: 98%     FHT:162 Lab orders placed from triage:  mau labor

## 2022-06-16 NOTE — Lactation Note (Signed)
This note was copied from a baby's chart. Lactation Consultation Note  Patient Name: Erica Walton [id number] Date: 06/16/2022 Age:29 hours  Discussed w/mom about BF and having HIV. Mom stated she is going to BF. Mom stated she can get milk from her breast so she wants to give it to her baby. Baby is out of the room. When baby comes back LC will assist in latching.   Maternal Data    Feeding    LATCH Score                    Lactation Tools Discussed/Used    Interventions    Discharge    Consult Status      Theodoro Kalata 06/16/2022, 8:49 PM

## 2022-06-16 NOTE — Anesthesia Procedure Notes (Signed)
Epidural Patient location during procedure: OB Start time: 06/16/2022 8:49 AM End time: 06/16/2022 8:54 AM  Staffing Anesthesiologist: Nilda Simmer, MD Performed: anesthesiologist   Preanesthetic Checklist Completed: patient identified, IV checked, site marked, risks and benefits discussed, surgical consent, monitors and equipment checked, pre-op evaluation and timeout performed  Epidural Patient position: sitting Prep: DuraPrep and site prepped and draped Patient monitoring: continuous pulse ox and blood pressure Approach: midline Location: L3-L4 Injection technique: LOR saline  Needle:  Needle type: Tuohy  Needle gauge: 17 G Needle length: 9 cm and 9 Needle insertion depth: 5 cm Catheter type: closed end flexible Catheter size: 19 Gauge Catheter at skin depth: 9 cm Test dose: negative  Assessment Events: blood not aspirated, injection not painful, no injection resistance, no paresthesia and negative IV test  Additional Notes The patient has requested an epidural for labor pain management. Risks and benefits including, but not limited to, infection, bleeding, local anesthetic toxicity, headache, hypotension, back pain, block failure, etc. were discussed with the patient. The patient expressed understanding and consented to the procedure. I confirmed that the patient has no bleeding disorders and is not taking blood thinners. I confirmed the patient's last platelet count with the nurse. A time-out was performed immediately prior to the procedure. Please see nursing documentation for vital signs. Sterile technique was used throughout the whole procedure. Once LOR achieved, the epidural catheter threaded easily without resistance. Aspiration of the catheter was negative for blood and CSF. The epidural was dosed slowly and an infusion was started.  -1-- attempt(s)Reason for block:procedure for pain

## 2022-06-16 NOTE — Progress Notes (Addendum)
Labor Progress Note Erica Walton is a 27 y.o. G6P5005 at 63w3dpresented for SROM '@0515'$   S: Sleeping  O:  BP (!) 96/53   Pulse 88   Temp 98.2 F (36.8 C) (Oral)   Resp 18   Ht '4\' 10"'$  (1.473 m)   Wt 76.2 kg   LMP 09/10/2021   SpO2 96%   BMI 35.11 kg/m  EFM: 135bpm/min to moderate/+accels, no decels  CVE: Dilation: 5 Effacement (%): 80 Station: -2 Presentation: Vertex Exam by:: H.Price, RN   A&P: 27y.o. GJF:[id number] for SROM @ 0515 this morning.  #Labor: Progressing slowly. Start pit 2by2 #Pain: Epidural #FWB: Cat I  #GBS negative PCR, received 1 dose of ancef prior to test result with shared decision making  HIV Viral load below detectable range. -Continue antivirals   HSV -Valtrex  Anemia Hgb 10. Grand multip.  -TXA @ del  SiColgate PalmoliveDO 2:07 PM

## 2022-06-16 NOTE — Anesthesia Preprocedure Evaluation (Signed)
Anesthesia Evaluation  Patient identified by MRN, date of birth, ID band Patient awake    Reviewed: Allergy & Precautions, NPO status , Patient's Chart, lab work & pertinent test results  History of Anesthesia Complications Negative for: history of anesthetic complications  Airway Mallampati: III  TM Distance: >3 FB Neck ROM: Full    Dental   Pulmonary asthma    Pulmonary exam normal breath sounds clear to auscultation       Cardiovascular negative cardio ROS  Rhythm:Regular Rate:Normal     Neuro/Psych  PSYCHIATRIC DISORDERS  Depression    negative neurological ROS     GI/Hepatic negative GI ROS, Neg liver ROS,,,  Endo/Other  negative endocrine ROS    Renal/GU negative Renal ROS     Musculoskeletal   Abdominal   Peds  Hematology  (+) Blood dyscrasia, anemia   Anesthesia Other Findings HIV, HSV  Reproductive/Obstetrics (+) Pregnancy                              Anesthesia Physical Anesthesia Plan  ASA: 2  Anesthesia Plan: Epidural   Post-op Pain Management:    Induction:   PONV Risk Score and Plan:   Airway Management Planned:   Additional Equipment:   Intra-op Plan:   Post-operative Plan:   Informed Consent: I have reviewed the patients History and Physical, chart, labs and discussed the procedure including the risks, benefits and alternatives for the proposed anesthesia with the patient or authorized representative who has indicated his/her understanding and acceptance.       Plan Discussed with: Anesthesiologist  Anesthesia Plan Comments: (I have discussed risks of neuraxial anesthesia including but not limited to infection, bleeding, nerve injury, back pain, headache, seizures, and failure of block. Patient denies bleeding disorders and is not currently anticoagulated. Labs have been reviewed. Risks and benefits discussed. All patient's questions answered.  )          Anesthesia Quick Evaluation

## 2022-06-16 NOTE — Discharge Summary (Signed)
Postpartum Discharge Summary  Date of Service updated***     Patient Name: Erica Walton DOB: 30-Jan-1996 MRN: [id number]  Date of admission: 06/16/2022 Delivery date:06/16/2022  Delivering provider: Gerlene Fee  Date of discharge: 06/16/2022  Admitting diagnosis: Uterine contractions during pregnancy [O47.9] Intrauterine pregnancy: [redacted]w[redacted]d    Secondary diagnosis:  Principal Problem:   Uterine contractions during pregnancy  Additional problems: ***    Discharge diagnosis: {DX.:23714}                                              Post partum procedures:{Postpartum procedures:23558} Augmentation: Pitocin Complications: None  Hospital course: Onset of Labor With Vaginal Delivery      27y.o. yo [id number] 325w3das admitted in Latent Labor on 06/16/2022. Labor course was uncomplicated.  Membrane Rupture Time/Date: 5:15 AM ,06/16/2022   Delivery Method:Vaginal, Spontaneous  Episiotomy: None  Lacerations:  None  Patient had a postpartum course complicated by ***.  She is ambulating, tolerating a regular diet, passing flatus, and urinating well. Patient is discharged home in stable condition on 06/16/22.  Newborn Data: Birth date:06/16/2022  Birth time:4:54 PM  Gender:Female  Living status:Living  Apgars:8 ,9  Weight:   Magnesium Sulfate received: No BMZ received: No Rhophylac:No MMR:{MMR:30440033} T-DaP:{Tdap:23962} Flu: {FWU:107179ransfusion:{Transfusion received:30440034}  Physical exam  Vitals:   06/16/22 1702 06/16/22 1716 06/16/22 1731 06/16/22 1747  BP: 103/71 103/72 117/71 111/67  Pulse: (!) 105 88 89 85  Resp: 18     Temp: 98.4 F (36.9 C)     TempSrc: Oral     SpO2:      Weight:      Height:       General: {Exam; general:21111117} Lochia: {Desc; appropriate/inappropriate:30686::"appropriate"} Uterine Fundus: {Desc; firm/soft:30687} Incision: {Exam; incision:21111123} DVT Evaluation: {Exam; dvt:2111122} Labs: Lab Results  Component Value  Date   WBC 14.3 (H) 06/16/2022   HGB 10.1 (L) 06/16/2022   HCT 31.7 (L) 06/16/2022   MCV 78.1 (L) 06/16/2022   PLT 215 06/16/2022      Latest Ref Rng & Units 07/20/2021    1:46 AM  CMP  Glucose 70 - 99 mg/dL 107   BUN 6 - 20 mg/dL 14   Creatinine 0.44 - 1.00 mg/dL 0.86   Sodium 135 - 145 mmol/L 140   Potassium 3.5 - 5.1 mmol/L 3.6   Chloride 98 - 111 mmol/L 106   CO2 22 - 32 mmol/L 24   Calcium 8.9 - 10.3 mg/dL 9.6    Edinburgh Score:    05/16/2020    3:32 PM  Edinburgh Postnatal Depression Scale Screening Tool  I have been able to laugh and see the funny side of things. 2  I have looked forward with enjoyment to things. 2  I have blamed myself unnecessarily when things went wrong. 2  I have been anxious or worried for no good reason. 2  I have felt scared or panicky for no good reason. 1  Things have been getting on top of me. 2  I have been so unhappy that I have had difficulty sleeping. 0  I have felt sad or miserable. 3  I have been so unhappy that I have been crying. 1  The thought of harming myself has occurred to me. 3  Edinburgh Postnatal Depression Scale Total 18     After visit meds:  Allergies as of 06/16/2022       Reactions   Latex Rash   Norco [hydrocodone-acetaminophen] Rash   Penicillins Rash, Other (See Comments)   Has patient had a PCN reaction causing immediate rash, facial/tongue/throat swelling, SOB or lightheadedness with hypotension: yes, [id number] Has patient had a PCN reaction causing severe rash involving mucus membranes or skin necrosis: no:30480221} Has patient had a PCN reaction that required hospitalization no:30480221} Has patient had a PCN reaction occurring within the last 10 years: no:30480221} If all of the above answers are "NO", then may proceed with Cephalosporin use.     Med Rec must be completed prior to using this [facility]***        Discharge home in stable condition Infant Feeding: {Baby feeding:23562} Infant  Disposition:{CHL IP OB HOME WITH OP:[id number] Discharge instruction: per After Visit Summary and Postpartum booklet. Activity: Advance as tolerated. Pelvic rest for 6 weeks.  Diet: {OB TF:[id number] Future Appointments: Future Appointments  Date Time Provider [place]  06/21/2022  7:00 AM [facility] [place] None   Follow up Visit:  Message sent to [facility] by Autry-Lott on 06/16/2022  Please schedule this patient for a In person postpartum visit in 6 weeks with the following provider: MD and APP. Additional Postpartum F/U: Needs follow up with ID for +HIV status. Needs pelvic floor PT due to cervical prolapse with this delivery and prior deliveries.    High risk pregnancy complicated by:  HSV, HIV, Grand multiparous Delivery mode:  Vaginal, Spontaneous  Anticipated Birth Control:   Nuvaring PP   06/16/2022 Simone Autry-Lott, DO

## 2022-06-16 NOTE — H&P (Addendum)
OBSTETRIC ADMISSION HISTORY AND PHYSICAL  Erica Walton is a 27 y.o. female [phone] with IUP at 46w3dby UKore[place]resenting for SROM @ 0515 06/16/22. She reports +FMs, No LOF, no VB, no blurry vision, headaches or peripheral edema, and RUQ pain.  She plans on breast feeding. She request nuvaring for birth control. [facility]  Dating: By early scan --->  Estimated Date of Delivery: 06/27/22  Sono:    '@[redacted]w[redacted]d'$ , CWD, normal anatomy, [id number], 66% EFW   Prenatal History/Complications: HIV positive, HSV,   Past Medical History: Past Medical History:  Diagnosis Date   Asthma    Dyshidrotic eczema 06/16/2021   Family history of Wilson's disease 06/23/2021   Genital herpes    HIV (human immunodeficiency virus infection) ([place]    HIV positive ([place] 06/16/2021   Hypolipidemia    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    Past Surgical History: Past Surgical History:  Procedure Laterality Date   APPENDECTOMY     LAPAROSCOPIC APPENDECTOMY N/A 07/22/2019   Procedure: APPENDECTOMY LAPAROSCOPIC;  Surgeon: CHerbert Pun MD;  Location: [facility] ORS;  Service: General;  Laterality: N/A;    Obstetrical History: OB History     Gravida  6   Para  5   Term  5   Preterm      AB      Living  5      SAB      IAB      Ectopic      Multiple  0   Live Births  5           Social History Social History   Socioeconomic History   Marital status: Married    Spouse name: ATanja Port   Number of children: 4   Years of education: Not on file   Highest education level: Not on file  Occupational History   Not on file  Tobacco Use   Smoking status: Never   Smokeless tobacco: Never  Vaping Use   Vaping Use: Never used  Substance and Sexual Activity   Alcohol use: Not Currently    Comment: 2018   Drug use: Never   Sexual activity: Yes    Birth control/protection: None    Comment: [place]  Other Topics Concern   Not on file  Social History  Narrative   Not on file   Social Determinants of Health   Financial Resource Strain: Not on file  Food Insecurity: Not on file  Transportation Needs: Not on file  Physical Activity: Not on file  Stress: Not on file  Social Connections: Not on file    Family History: Family History  Problem Relation Age of Onset   Asthma Mother    Wilson's disease Mother    Obesity Father    Hypertension Father    Asthma Father    Cancer Father    Asthma Sister    Asthma Brother    Diabetes Maternal Uncle    Obesity Maternal Grandmother    Diabetes Maternal Grandmother    Asthma Maternal Grandmother    Obesity Paternal Grandfather    Hypertension Paternal Grandfather    Heart disease Paternal Grandfather    Cancer Paternal Grandfather    Breast cancer Other    Stroke Neg Hx     Allergies: Allergies  Allergen Reactions   Latex Rash   Norco [Hydrocodone-Acetaminophen] Rash   Penicillins Rash and Other (See Comments)    Has patient had a  PCN reaction causing immediate rash, facial/tongue/throat swelling, SOB or lightheadedness with hypotension: yes, [id number] Has patient had a PCN reaction causing severe rash involving mucus membranes or skin necrosis: no:[id number]} Has patient had a PCN reaction that required hospitalization no:[id number]} Has patient had a PCN reaction occurring within the last 10 years: no:[id number]} If all of the above answers are "NO", then may proceed with Cephalosporin use.     Medications Prior to Admission  Medication Sig Dispense Refill Last Dose   dolutegravir (TIVICAY) 50 MG tablet Take 1 tablet (50 mg total) by mouth every evening. 30 tablet 5 06/15/2022   emtricitabine-tenofovir AF (DESCOVY) 200-25 MG tablet Take 1 tablet by mouth every evening. 30 tablet 5 06/15/2022   valACYclovir (VALTREX) 500 MG tablet Take 1 tablet (500 mg total) by mouth 2 (two) times daily. 60 tablet 6 06/15/2022   albuterol (VENTOLIN HFA) 108 (90 Base) MCG/ACT inhaler Inhale 2  puffs into the lungs every 6 (six) hours as needed for wheezing or shortness of breath. (Patient not taking: Reported on 05/25/2022) 1 each 3    Blood Pressure KIT 1 Device by Does not apply route once a week. (Patient not taking: Reported on 05/25/2022) 1 kit 0    budesonide-formoterol (SYMBICORT) 80-4.5 MCG/ACT inhaler Inhale 2 puffs into the lungs in the morning and at bedtime. (Patient not taking: Reported on 05/25/2022) 1 each 12    ferrous sulfate (FERROUSUL) 325 (65 FE) MG tablet Take 1 tablet (325 mg total) by mouth every other day. (Patient not taking: Reported on 05/25/2022) 30 tablet 3    ondansetron (ZOFRAN-ODT) 4 MG disintegrating tablet Take 1 tablet (4 mg total) by mouth every 6 (six) hours as needed for nausea. 20 tablet 0    Prenatal MV & Min w/FA-DHA (PRENATAL GUMMIES PO) Take by mouth.      Prenatal Vit-Fe Phos-FA-Omega (VITAFOL GUMMIES) 3.33-0.333-34.8 MG CHEW CHEW 3 TABLETS BY MOUTH DAILY. 90 tablet 5    promethazine (PHENERGAN) 25 MG tablet Take 1 tablet (25 mg total) by mouth every 6 (six) hours as needed for nausea or vomiting. 30 tablet 0      Review of Systems   All systems reviewed and negative except as stated in HPI  Blood pressure 115/73, pulse 90, temperature 98 F (36.7 C), temperature source Oral, resp. rate 17, height '4\' 10"'$  (1.473 m), weight 76.2 kg, last menstrual period 09/10/2021, SpO2 96 %. General appearance: alert, cooperative, appears stated age, and no distress Lungs: clear to auscultation bilaterally Heart: regular rate and rhythm Abdomen: soft, non-tender; bowel sounds normal Pelvic: performed by another provider Extremities: no sign of DVT Presentation: cephalic Fetal monitoringBaseline: 135 bpm Uterine activityFrequency: Every 6-8.5 minutes Dilation: 3.5 Effacement (%): 50 Station: -2 Exam by:: Osvaldo Human RN   Prenatal labs: ABO, Rh: B/Positive/-- (08/21 1516) Antibody: Negative (08/21 1516) Rubella: 2.70 (08/21 1516) RPR: Non Reactive  (12/19 0922)  HBsAg: Negative (08/21 1516)  HIV: Preliminary Reactive (12/19 [id number])  GBS:   Not performed 2hr GTT wnl Genetic screening  NIPS LR female. Anatomy US complete  Prenatal Transfer Tool  Maternal Diabetes: No Genetic Screening: Normal Maternal Ultrasounds/Referrals: Normal Fetal Ultrasounds or other Referrals:  None Maternal Substance Abuse:  No Significant Maternal Medications:  Meds include: Other: Descovy, tikivay, Valtrex Significant Maternal Lab Results:  HIV positive Number of Prenatal Visits:greater than 3 verified prenatal visits Other Comments:  None  Results for orders placed or performed during the hospital encounter of 06/16/22 (from the past 24 hour(s))  POCT  fern test   Collection Time: 06/16/22  6:32 AM  Result Value Ref Range   POCT Fern Test Positive = ruptured amniotic membanes   Fern Test   Collection Time: 06/16/22  6:34 AM  Result Value Ref Range   POCT Fern Test Positive = ruptured amniotic membanes     Patient Active Problem List   Diagnosis Date Noted   Anemia of pregnancy in third trimester 04/10/2022   Grand multiparity, antepartum 12/08/2021   HIV (human immunodeficiency virus) risk factors complicating pregnancy, third trimester 12/08/2021   Supervision of high risk pregnancy, antepartum 11/17/2021   Mixed hyperlipidemia 06/23/2021   Herpes 10/06/2013   Asthma 11/17/2011   Human immunodeficiency virus (HIV) disease ([place]) 11/17/2011    Assessment/Plan:  Erica Walton is a 27 y.o. G6P5005 at 61w3dhere for SROM  #Labor: SROM @ 0515 today at home.  #Pain: Planning for epidural as needed #FWB: Cat 1 tracing #ID:  GBS not performed. Getting GBS PCR now.  #MOF: breast #MOC:Undecided  #Circ:  N/A, female baby  HIV Well-controlled on descovy and tivicay. Viral load undetectable 05/27/22 - Cont home descovy and tivicay  HSV Started on Valacyclovir @ 36wks. Denies any active lesions at this time.  - cont valacyclovir  JArlyce Dice MD  06/16/2022, 6:54 AM  Fellow Attestation  I saw and evaluated the patient, performing the key elements of the service.I  personally performed or re-performed the history, physical exam, and medical decision making activities of this service and have verified that the service and findings are accurately documented in the resident's note. I developed the management plan that is described in the resident's note, and I agree with the content, with my edits above.    VGifford Shave MD OB Fellow

## 2022-06-17 LAB — CERVICOVAGINAL ANCILLARY ONLY
Chlamydia: NEGATIVE
Comment: NEGATIVE
Comment: NEGATIVE
Comment: NORMAL
Neisseria Gonorrhea: NEGATIVE
Trichomonas: NEGATIVE

## 2022-06-17 LAB — CBC
HCT: 29.5 % — ABNORMAL LOW (ref 36.0–46.0)
Hemoglobin: 9.4 g/dL — ABNORMAL LOW (ref 12.0–15.0)
MCH: 24.7 pg — ABNORMAL LOW (ref 26.0–34.0)
MCHC: 31.9 g/dL (ref 30.0–36.0)
MCV: 77.6 fL — ABNORMAL LOW (ref 80.0–100.0)
Platelets: 186 10*3/uL (ref 150–400)
RBC: 3.8 MIL/uL — ABNORMAL LOW (ref 3.87–5.11)
RDW: 16.5 % — ABNORMAL HIGH (ref 11.5–15.5)
WBC: 14.3 10*3/uL — ABNORMAL HIGH (ref 4.0–10.5)
nRBC: 0 % (ref 0.0–0.2)

## 2022-06-17 NOTE — Social Work (Signed)
CSW received consult for HIV exposed newborn.  CSW met with MOB to offer support and complete assessment.  CSW entered the room and observed MOB in bed feeding the infant. CSW introduced self, CSW role and reason for visit, MOB was agreeable to visit. CSW inquired about how MOB was feeling, MOB reported good. CSW inquired about any MH concerns. 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 MOB reported she stopped taking he medication because she was able to self manage, MOB reported one of her coping skills is talking things out with her spouse. CSW assessed for safety, MOB denied any SI, HI or DV. CSW provided education regarding the baby blues period vs. perinatal mood disorders, discussed treatment and gave resources for mental health follow up if concerns arise.  CSW recommends self-evaluation during the postpartum time period using the New Mom Checklist from Postpartum Progress and encouraged MOB to contact a medical professional if symptoms are noted at any time. MOB identified her spouse and spouse's grandmother as her supports.   CSW inquired about ID follow up for the infant, MOB reported the pediatrician has to send referral information over to [facility] but she is already established there with her other children. MOB reported no current concerns around her status or the infants plan of care.      CSW provided review of Sudden Infant Death Syndrome (SIDS) precautions.  MOB identified [facility] for infants follow up care. MOB reported she has all necessary items for the infant including a pack n play and car seat.  CSW identifies no further need for intervention and no barriers to discharge at this time.  Letta Kocher, [place] Social Worker [phone]

## 2022-06-17 NOTE — Lactation Note (Addendum)
This note was copied from a baby's chart. Lactation Consultation Note  Patient Name: Erica Walton Tor [id number] Date: 06/17/2022 Age:29 hours    Vivianne Master [facility] 06/17/2022, 3:16 PM

## 2022-06-17 NOTE — Progress Notes (Signed)
POSTPARTUM PROGRESS NOTE  Post Partum Day 1  Subjective:  Erica Walton is a 27 y.o. [id number] s/p NSVD at [redacted]w[redacted]d  She reports she is doing well. No acute events overnight. She denies any problems with ambulating, voiding or po intake. Denies nausea or vomiting.  Pain is well controlled. She reports sensation of pressure from cervical prolapse. Lochia is equal to menses.  Objective: Blood pressure 120/80, pulse 85, temperature 98.2 F (36.8 C), temperature source Oral, resp. rate 18, height '4\' 10"'$  (1.473 m), weight 76.2 kg, last menstrual period 09/10/2021, SpO2 99 %, unknown if currently breastfeeding.  Physical Exam:  General: alert, cooperative and no distress Chest: no respiratory distress Heart:regular rate, distal pulses intact Abdomen: soft, nontender,  Uterine Fundus: firm, appropriately tender DVT Evaluation: No calf swelling or tenderness Extremities: trace edema Skin: warm, dry  Recent Labs    06/16/22 0654 06/17/22 0528  HGB 10.1* 9.4*  HCT 31.7* 29.5*    Assessment/Plan: Erica MERKELis a 27y.o. [id number]/p NSVD at 373w3d PPD#1 - Doing well  Routine postpartum care Contraception: Previously decided outpatient Nuvaring, but due to possible risk of affecting lactation, she is now considering outpatient Nexplanon. Feeding: breast HIV - Continue home Descovy, Tivicay Cervical prolapse - Patient reports history of same following previous deliveries, resolved with time and PT. Dispo: Plan for discharge tomorrow.   LOS: 1 day   LyDarlen RoundD PGY-1 06/17/2022, 7:16 AM

## 2022-06-17 NOTE — Anesthesia Postprocedure Evaluation (Signed)
Anesthesia Post Note  Patient: Erica Walton  Procedure(s) Performed: AN AD HOC LABOR EPIDURAL     Patient location during evaluation: Mother Baby Anesthesia Type: Epidural Level of consciousness: awake, oriented and awake and alert Pain management: pain level controlled Vital Signs Assessment: post-procedure vital signs reviewed and stable Respiratory status: respiratory function stable, spontaneous breathing and nonlabored ventilation Cardiovascular status: stable Postop Assessment: no headache, adequate PO intake, able to ambulate, patient able to bend at knees and no apparent nausea or vomiting Anesthetic complications: no   No notable events documented.  Last Vitals:  Vitals:   06/16/22 2113 06/17/22 0400  BP:  120/80  Pulse: 89 85  Resp: 18 18  Temp: 36.7 C 36.8 C  SpO2: 99% 99%    Last Pain:  Vitals:   06/17/22 0740  TempSrc:   PainSc: 0-No pain   Pain Goal:                   Erica Walton

## 2022-06-17 NOTE — Lactation Note (Signed)
This note was copied from a baby's chart. Lactation Consultation Note  Patient Name: Erica Walton [id number] Date: 06/17/2022 Age:29 hours Reason for consult: Follow-up assessment  When LC entered room mother was pumping with manual pump.  She will give drops to baby in addition to breastfeeding with spoon.  Reviewed milk storage and cleaning.  Mother aware.  Mother states breastfeeding is going well and denies questions or concerns at this time.  Feeding Mother's Current Feeding Choice: Breast Milk  Lactation Tools Discussed/Used Tools: Pump Pump Education: Setup, frequency, and cleaning;Milk Storage Pumping frequency: PRN  Interventions Interventions: Hand pump;Education Consult Status Consult Status: Follow-up Date: 06/18/22 Follow-up type: In-patient    Vivianne Master [facility] 06/17/2022, 3:18 PM

## 2022-06-18 ENCOUNTER — Encounter: Payer: Self-pay | Admitting: *Deleted

## 2022-06-18 ENCOUNTER — Telehealth: Payer: Self-pay

## 2022-06-18 ENCOUNTER — Encounter: Payer: Medicaid Other | Admitting: Obstetrics and Gynecology

## 2022-06-18 NOTE — Transitions of Care (Post Inpatient/ED Visit) (Signed)
   06/18/2022  Name: DUSKA BRACKENRIDGE MRN: [id number] DOB: 05-28-1995  Today's TOC FU Call Status: Today's TOC FU Call Status:: Successful TOC FU Call Competed TOC FU Call Complete Date: 06/18/22  Transition Care Management Follow-up Telephone Call Date of Discharge: 06/17/22 [facility]charge Facility[facility] [place]) Type of Discharge: Inpatient Admission Primary Inpatient Discharge Diagnosis:: Labor/Delivery Any questions or concerns?: No  Items Reviewed: Did you receive and understand the discharge instructions provided?: Yes Any new allergies since your discharge?: No Dietary orders reviewed?: NA Do you have support at home?: Yes  Home Care and Equipment/Supplies: [place] Ordered?: NA Any new equipment or medical supplies ordered?: NA  Functional Questionnaire: Do you need assistance with bathing/showering or dressing?: No Do you need assistance with meal preparation?: No Do you need assistance with eating?: No Do you have difficulty maintaining continence: No Do you need assistance with getting out of bed/getting out of a chair/moving?: No Do you have difficulty managing or taking your medications?: No  Folllow up appointments reviewed: PCP Follow-up appointment confirmed?: Yes Date of PCP follow-up appointment?: 07/30/22 Follow-up Provider: [facility] Follow-up appointment confirmed?: No Reason Specialist Follow-Up Not Confirmed: Patient has Specialist Provider Number and will Call for Appointment Do you need transportation to your follow-up appointment?: No Do you understand care options if your condition(s) worsen?: Yes-patient verbalized understanding  MM Services offered patient declined  Mickel Fuchs, BSW, [place] Medicaid Team  [phone]

## 2022-06-19 LAB — STREP GP B CULTURE+RFLX: Strep Gp B Culture+Rflx: NEGATIVE

## 2022-06-20 ENCOUNTER — Encounter: Payer: Self-pay | Admitting: Family Medicine

## 2022-06-21 ENCOUNTER — Inpatient Hospital Stay (HOSPITAL_COMMUNITY): Payer: Medicaid Other

## 2022-06-21 ENCOUNTER — Inpatient Hospital Stay (HOSPITAL_COMMUNITY)
Admission: RE | Admit: 2022-06-21 | Payer: Medicaid Other | Source: Home / Self Care | Admitting: Obstetrics and Gynecology

## 2022-06-27 ENCOUNTER — Telehealth (HOSPITAL_COMMUNITY): Payer: Self-pay

## 2022-06-27 NOTE — Telephone Encounter (Signed)
Patient reports feeling good. Patient declines questions/concerns about her health and healing.  Patient reports that baby is doing well. Baby sleeps in a Pack n Play. RN reviewed ABC's of safe sleep with patient. Patient declines any questions or concerns about baby.  EPDS score is 0.  [place] [facility] [place]   06/27/22,1012

## 2022-07-01 ENCOUNTER — Encounter: Payer: Self-pay | Admitting: Obstetrics and Gynecology

## 2022-07-09 ENCOUNTER — Ambulatory Visit: Payer: Medicaid Other | Admitting: Advanced Practice Midwife

## 2022-07-09 DIAGNOSIS — Z30018 Encounter for initial prescription of other contraceptives: Secondary | ICD-10-CM | POA: Diagnosis not present

## 2022-07-09 MED ORDER — ETONOGESTREL-ETHINYL ESTRADIOL 0.12-0.015 MG/24HR VA RING: VAGINAL_RING | VAGINAL | 12 refills | Status: DC

## 2022-07-09 NOTE — Progress Notes (Signed)
    [place] Partum Visit Note  Erica Walton is a 27 y.o. [phone] female who presents for a postpartum visit. She is 3 weeks postpartum following a normal spontaneous vaginal delivery.  I have fully reviewed the prenatal and intrapartum course. The delivery was at 25 gestational weeks.  Anesthesia: epidural. Postpartum course has been uncomplicated. Baby is doing well. Baby is feeding by bottle - Similac Sensitive RS. Bleeding staining only. Bowel function is normal. Bladder function is normal. Patient is not sexually active. Contraception method is none wants Nuvaring. Postpartum depression screening: negative.   The pregnancy intention screening data noted above was reviewed. Potential methods of contraception were discussed. The patient elected to proceed with Nuvaring. She is very familiar with this method and declines counseling on other methods or best practices for use of Nuvaring.     Health Maintenance Due  Topic Date Due   COVID-19 Vaccine (1) Never done   HPV VACCINES (3 - Risk 3-dose series) 09/26/2010    The following portions of the patient's history were reviewed and updated as appropriate: allergies, current medications, past family history, past medical history, past social history, past surgical history, and problem list.  Review of Systems A comprehensive review of systems was negative.  Objective:  There were no vitals taken for this visit.   General:  alert, cooperative, appears stated age, and no distress   Breasts:  not indicated  Lungs: Normal WOB  Heart:  Normal pulse       Assessment:   Vaginal delivery Encounter for Nuvaring prescription 3. Normal postpartum exam.   Plan:   Essential components of care per ACOG recommendations:  1.  Mood and well being: Patient with negative depression screening today. Reviewed local resources for support.    2. Infant care and feeding:  -Patient currently breastmilk feeding? No.  -Social determinants of health  (SDOH) reviewed in EPIC. No concerns. Patient returning to work early as she is the only income for her family.  She denies concerns once she starts working.  3. Sexuality, contraception and birth spacing - Patient does not want a pregnancy in the next year.   4. Sleep and fatigue -Encouraged family/partner/community support of 4 hrs of uninterrupted sleep to help with mood and fatigue  5. Physical Recovery  - Discussed patients delivery and complications. She describes her labor as good. - Patient had a Vaginal, no problems at delivery. Patient had an intact perineum, no laceration. Perineal healing reviewed. Patient expressed understanding - Patient has urinary incontinence? No. - Patient is safe to resume physical and sexual activity  6.  Health Maintenance - HM due items addressed Yes - Last pap smear  Diagnosis  Date Value Ref Range Status  05/16/2020   Final   - Negative for intraepithelial lesion or malignancy (NILM)   Pap smear not done at today's visit.  -Breast Cancer screening indicated? No.   7. Chronic Disease/Pregnancy Condition follow up  - PCP follow up  Mallie Snooks, [place], MSN, CNM Certified Nurse Midwife, [facility] for [employer], [place]

## 2022-07-11 ENCOUNTER — Other Ambulatory Visit: Payer: Self-pay | Admitting: Infectious Diseases

## 2022-07-11 DIAGNOSIS — B20 Human immunodeficiency virus [HIV] disease: Secondary | ICD-10-CM

## 2022-07-18 ENCOUNTER — Telehealth (HOSPITAL_COMMUNITY): Payer: Self-pay

## 2022-07-18 NOTE — Telephone Encounter (Signed)
Preadmission testing 

## 2022-07-30 ENCOUNTER — Ambulatory Visit: Payer: Medicaid Other | Admitting: Obstetrics and Gynecology

## 2022-08-06 ENCOUNTER — Encounter: Payer: Self-pay | Admitting: Family Medicine

## 2022-08-07 ENCOUNTER — Encounter: Payer: Self-pay | Admitting: *Deleted

## 2022-08-07 NOTE — Progress Notes (Signed)
erroneous

## 2022-09-21 ENCOUNTER — Ambulatory Visit (HOSPITAL_BASED_OUTPATIENT_CLINIC_OR_DEPARTMENT_OTHER): Payer: Medicaid Other | Admitting: Family Medicine

## 2022-09-23 ENCOUNTER — Telehealth (HOSPITAL_BASED_OUTPATIENT_CLINIC_OR_DEPARTMENT_OTHER): Payer: Self-pay

## 2022-09-23 NOTE — Telephone Encounter (Signed)
Lvm for patient to reschedule appt for new patient

## 2022-10-05 ENCOUNTER — Ambulatory Visit (HOSPITAL_BASED_OUTPATIENT_CLINIC_OR_DEPARTMENT_OTHER): Payer: Medicaid Other | Admitting: Family Medicine

## 2022-10-05 ENCOUNTER — Encounter (HOSPITAL_BASED_OUTPATIENT_CLINIC_OR_DEPARTMENT_OTHER): Payer: Self-pay | Admitting: Family Medicine

## 2022-10-05 VITALS — BP 120/84 | HR 85 | Ht <= 58 in | Wt 149.6 lb

## 2022-10-05 DIAGNOSIS — J4541 Moderate persistent asthma with (acute) exacerbation: Secondary | ICD-10-CM | POA: Diagnosis not present

## 2022-10-05 DIAGNOSIS — D649 Anemia, unspecified: Secondary | ICD-10-CM

## 2022-10-05 DIAGNOSIS — R1084 Generalized abdominal pain: Secondary | ICD-10-CM | POA: Diagnosis not present

## 2022-10-05 DIAGNOSIS — M5442 Lumbago with sciatica, left side: Secondary | ICD-10-CM

## 2022-10-05 DIAGNOSIS — M545 Low back pain, unspecified: Secondary | ICD-10-CM

## 2022-10-05 DIAGNOSIS — E782 Mixed hyperlipidemia: Secondary | ICD-10-CM

## 2022-10-05 DIAGNOSIS — G8929 Other chronic pain: Secondary | ICD-10-CM | POA: Diagnosis not present

## 2022-10-05 DIAGNOSIS — E559 Vitamin D deficiency, unspecified: Secondary | ICD-10-CM | POA: Diagnosis not present

## 2022-10-05 HISTORY — DX: Low back pain, unspecified: M54.50

## 2022-10-05 MED ORDER — BUDESONIDE-FORMOTEROL FUMARATE 80-4.5 MCG/ACT IN AERO
2.0000 | INHALATION_SPRAY | Freq: Two times a day (BID) | RESPIRATORY_TRACT | 3 refills | Status: AC
Start: 2022-10-05 — End: ?

## 2022-10-05 MED ORDER — HYDROCORTISONE VALERATE 0.2 % EX CREA: 1.0000 | TOPICAL_CREAM | Freq: Two times a day (BID) | CUTANEOUS | 0 refills | Status: DC

## 2022-10-05 MED ORDER — PREDNISONE 20 MG PO TABS
20.0000 mg | ORAL_TABLET | Freq: Every day | ORAL | 0 refills | Status: AC
Start: 2022-10-05 — End: 2022-10-10

## 2022-10-05 MED ORDER — CYCLOBENZAPRINE HCL 5 MG PO TABS
5.0000 mg | ORAL_TABLET | Freq: Three times a day (TID) | ORAL | 1 refills | Status: DC | PRN
Start: 2022-10-05 — End: 2023-08-24

## 2022-10-05 NOTE — Progress Notes (Unsigned)
Established Patient Office Visit  Subjective   Patient ID: Erica Walton, female    DOB: 11-26-1995  Age: 27 y.o. MRN: [id number]  Erica Walton is a 27 year-old female patient who presents today for management of her chronic conditions. She recently had her sixth child in February- she is 9 months old at this time. Patient reports she is currently not breastfeeding.   Asthma- daily exacerbations Using albuterol once daily, was unable to pick up Symbicort due to price   Noticed her breathing worsened with this allergy season   R hip pain and low back pain- without numbness/tingling  Prior to when she had kids is when this pain started- icy/hot, tylenol not helping Denies bowel/bladder incontinence, weakness in lower extremities, muscle pain, recent injury.    Abdominal pain- during and after when she eats food, does not matter what type of food she is happening  Cramping with diarrhea, very pungent smell   Review of Systems  Constitutional:  Negative for malaise/fatigue.  Respiratory:  Negative for cough and shortness of breath.   Cardiovascular:  Negative for chest pain and palpitations.  Gastrointestinal:  Negative for abdominal pain, nausea and vomiting.  Musculoskeletal:  Negative for myalgias.  Neurological:  Negative for dizziness, weakness and headaches.  Psychiatric/Behavioral:  Negative for depression and suicidal ideas. The patient is not nervous/anxious.      Objective:    BP 120/84   Pulse 85   Ht 4\' 10"  (1.473 m)   Wt 149 lb 9.6 oz (67.9 kg)   SpO2 99%   Breastfeeding No   BMI 31.27 kg/m  BP Readings from Last 3 Encounters:  10/05/22 120/84  07/09/22 117/77  06/17/22 121/70    Physical Exam Constitutional:      Appearance: Normal appearance.  Cardiovascular:     Rate and Rhythm: Normal rate and regular rhythm.     Pulses: Normal pulses.     Heart sounds: Normal heart sounds.  Pulmonary:     Effort: Pulmonary effort is normal.     Breath sounds:  Examination of the right-upper field reveals wheezing. Examination of the right-middle field reveals wheezing. Examination of the right-lower field reveals wheezing. Wheezing present.  Musculoskeletal:     Cervical back: Normal and normal range of motion.     Thoracic back: Tenderness present.     Lumbar back: Tenderness present. Negative right straight leg raise test and negative left straight leg raise test.     Right hip: Tenderness present.     Left hip: Tenderness present.  Neurological:     Mental Status: She is alert.  Psychiatric:        Mood and Affect: Mood normal.        Behavior: Behavior normal.        Thought Content: Thought content normal.        Judgment: Judgment normal.     Assessment & Plan:   1. Chronic bilateral low back pain with left-sided sciatica Patient presents with chronic lower back pain and hip pain. Review of notes with previous provider Enid Skeens, NP on 06/16/2021- patient has dealt with chronic back pain for the past 15 years. Denies bladder and/or bowel incontinence, fever/chills, lower extremity weakness, numbness/tingling, nocturnal pain, morning stiffness. Physical exam unremarkable. Negative right SLR and positive left SLR. Reports pain along with numbness/tingling from left hip to lower left leg. Denies neck pain/stiffness. No red flags present. Discussed options, patient is unable to commit to physical therapy at this time.  Will treat as sciatica with oral prednisone and prescribe Flexeril to use as needed for relief of muscle spasms. Advised her to use over-the-counter ibuprofen for pain/inflammation. Follow-up in 6 weeks- may require imaging.   - cyclobenzaprine (FLEXERIL) 5 MG tablet; Take 1 tablet (5 mg total) by mouth 3 (three) times daily as needed for muscle spasms.  Dispense: 30 tablet; Refill: 1 - predniSONE (DELTASONE) 20 MG tablet; Take 1 tablet (20 mg total) by mouth daily with breakfast for 5 days.  Dispense: 5 tablet; Refill: 0  2.  Moderate persistent asthma with acute exacerbation Wheezing present in upper, middle and lower right lobes. Patient reports her asthma has been worse over the past month. Reports increased wheezing and use of her albuterol inhaler as needed. Advised patient to pick up Symbicort if she is able to and use twice a day for adequate relief and prevention of acute exacerbations. Will utilize prednisone to help decrease inflammation of airways.  - budesonide-formoterol (SYMBICORT) 80-4.5 MCG/ACT inhaler; Inhale 2 puffs into the lungs 2 (two) times daily.  Dispense: 1 each; Refill: 3  3. Anemia, unspecified type Patient has a history of anemia in her third trimester. Review of last CBC shows anemia. Denies symptoms. Plan to reassess CBC today. Will treat accordingly.  - CBC with Differential/Platelet  4. Mixed hyperlipidemia Review of previous labs. Last full lipid panel checked on 06/17/2021 with mixed hyperlipidemia noted. Patient is currently not on any pharmacological therapy. Discussed lifestyle modifications- including healthy diet and daily exercise. Will recheck lipid panel today, along with hemoglobin A1c.  - Lipid Profile - Hemoglobin A1c  5. Vitamin D deficiency Patient has a history of vitamin D deficiency. She was taking high-dose vitamin D in the past and is not currently taking an vitamin D supplements. Will check vitamin D level today and treat accordingly.  - VITAMIN D 25 Hydroxy (Vit-D Deficiency, Fractures)  6. Generalized abdominal pain Patient presents with concerns for generalized abdominal pain, cramping/bloating, and frequent diarrhea. She reports that it is consistent with consumption of food- reports that she is unsure if it is any specific foods that cause these symptoms. Plan to assess tissue transglutaminase for Celiac disease. No red flags present on exam. Less concerns for pancreatitis, cholecystitis, diverticulitis, small bowel obstruction, peptic ulcer disease. Discussed  possible imaging and possible referral to GI.  - Tissue transglutaminase, IgA  Return in about 6 weeks (around 11/16/2022) for back pain.   Spent 30 minutes on this patient encounter, including preparation, chart review, face-to-face counseling with patient and coordination of care, and documentation of encounter.   Alyson Reedy, FNP

## 2022-10-06 LAB — CBC WITH DIFFERENTIAL/PLATELET
Basophils Absolute: 0 10*3/uL (ref 0.0–0.2)
Basos: 0 %
EOS (ABSOLUTE): 0.2 10*3/uL (ref 0.0–0.4)
Eos: 3 %
Hematocrit: 39.7 % (ref 34.0–46.6)
Hemoglobin: 13 g/dL (ref 11.1–15.9)
Immature Grans (Abs): 0 10*3/uL (ref 0.0–0.1)
Immature Granulocytes: 0 %
Lymphocytes Absolute: 2.5 10*3/uL (ref 0.7–3.1)
Lymphs: 34 %
MCH: 27.3 pg (ref 26.6–33.0)
MCHC: 32.7 g/dL (ref 31.5–35.7)
MCV: 83 fL (ref 79–97)
Monocytes Absolute: 0.5 10*3/uL (ref 0.1–0.9)
Monocytes: 7 %
Neutrophils Absolute: 3.9 10*3/uL (ref 1.4–7.0)
Neutrophils: 56 %
Platelets: 360 10*3/uL (ref 150–450)
RBC: 4.76 x10E6/uL (ref 3.77–5.28)
RDW: 14.2 % (ref 11.7–15.4)
WBC: 7.2 10*3/uL (ref 3.4–10.8)

## 2022-10-06 LAB — HEMOGLOBIN A1C
Est. average glucose Bld gHb Est-mCnc: 100 mg/dL
Hgb A1c MFr Bld: 5.1 % (ref 4.8–5.6)

## 2022-10-06 LAB — TISSUE TRANSGLUTAMINASE, IGA: Transglutaminase IgA: <2 U/mL (ref 0–3)

## 2022-10-06 LAB — LIPID PANEL
Chol/HDL Ratio: 4.1 ratio (ref 0.0–4.4)
Cholesterol, Total: 209 mg/dL — ABNORMAL HIGH (ref 100–199)
HDL: 51 mg/dL (ref >39)
LDL Chol Calc (NIH): 115 mg/dL — ABNORMAL HIGH (ref 0–99)
Triglycerides: 246 mg/dL — ABNORMAL HIGH (ref 0–149)
VLDL Cholesterol Cal: 43 mg/dL — ABNORMAL HIGH (ref 5–40)

## 2022-10-06 LAB — VITAMIN D 25 HYDROXY (VIT D DEFICIENCY, FRACTURES): Vit D, 25-Hydroxy: 13.2 ng/mL — ABNORMAL LOW (ref 30.0–100.0)

## 2022-10-07 ENCOUNTER — Other Ambulatory Visit (HOSPITAL_BASED_OUTPATIENT_CLINIC_OR_DEPARTMENT_OTHER): Payer: Self-pay | Admitting: Family Medicine

## 2022-10-07 ENCOUNTER — Encounter (HOSPITAL_BASED_OUTPATIENT_CLINIC_OR_DEPARTMENT_OTHER): Payer: Self-pay | Admitting: Family Medicine

## 2022-10-07 DIAGNOSIS — E782 Mixed hyperlipidemia: Secondary | ICD-10-CM

## 2022-10-07 MED ORDER — ATORVASTATIN CALCIUM 20 MG PO TABS: 20.0000 mg | ORAL_TABLET | Freq: Every day | ORAL | 2 refills | Status: DC

## 2022-10-07 MED ORDER — VITAMIN D3 25 MCG (1000 UT) PO CAPS: 1000.0000 [IU] | ORAL_CAPSULE | Freq: Every day | ORAL | 2 refills | Status: DC

## 2022-10-12 ENCOUNTER — Ambulatory Visit: Payer: Medicaid Other | Admitting: Infectious Diseases

## 2022-10-14 ENCOUNTER — Encounter (HOSPITAL_BASED_OUTPATIENT_CLINIC_OR_DEPARTMENT_OTHER): Payer: Self-pay

## 2022-10-14 ENCOUNTER — Other Ambulatory Visit: Payer: Self-pay

## 2022-10-14 ENCOUNTER — Other Ambulatory Visit (HOSPITAL_BASED_OUTPATIENT_CLINIC_OR_DEPARTMENT_OTHER): Payer: Self-pay

## 2022-10-14 ENCOUNTER — Emergency Department (HOSPITAL_BASED_OUTPATIENT_CLINIC_OR_DEPARTMENT_OTHER)
Admission: EM | Admit: 2022-10-14 | Discharge: 2022-10-14 | Disposition: A | Discharge status: Home / Self Care | Payer: Medicaid Other | Attending: Emergency Medicine | Admitting: Emergency Medicine

## 2022-10-14 DIAGNOSIS — J029 Acute pharyngitis, unspecified: Secondary | ICD-10-CM | POA: Diagnosis present

## 2022-10-14 DIAGNOSIS — Z91040 Latex allergy status: Secondary | ICD-10-CM | POA: Insufficient documentation

## 2022-10-14 DIAGNOSIS — J45909 Unspecified asthma, uncomplicated: Secondary | ICD-10-CM | POA: Insufficient documentation

## 2022-10-14 DIAGNOSIS — Z21 Asymptomatic human immunodeficiency virus [HIV] infection status: Secondary | ICD-10-CM | POA: Insufficient documentation

## 2022-10-14 DIAGNOSIS — J069 Acute upper respiratory infection, unspecified: Secondary | ICD-10-CM | POA: Insufficient documentation

## 2022-10-14 DIAGNOSIS — Z1152 Encounter for screening for COVID-19: Secondary | ICD-10-CM | POA: Insufficient documentation

## 2022-10-14 DIAGNOSIS — B9789 Other viral agents as the cause of diseases classified elsewhere: Secondary | ICD-10-CM | POA: Diagnosis not present

## 2022-10-14 LAB — RESP PANEL BY RT-PCR (RSV, FLU A&B, COVID)  RVPGX2
Influenza A by PCR: NEGATIVE
Influenza B by PCR: NEGATIVE
Resp Syncytial Virus by PCR: NEGATIVE
SARS Coronavirus 2 by RT PCR: NEGATIVE

## 2022-10-14 LAB — GROUP A STREP BY PCR: Group A Strep by PCR: NOT DETECTED

## 2022-10-14 NOTE — Discharge Instructions (Signed)
Return to the ED with any new or worsening signs or symptoms Please take Tylenol for headaches and fevers.  Please take ibuprofen for body aches and chills.  Please push electrolyte supplementation beverages such as Pedialyte, please push water.  Please eat high-protein low-fat diet. Read attached guide concerning upper respiratory infections You will follow-up with your PCP for evaluation

## 2022-10-14 NOTE — ED Notes (Signed)
Pt discharged to home using teachback Method. Discharge instructions have been discussed with patient and/or family members. Pt verbally acknowledges understanding d/c instructions, has been given opportunity for questions to be answered, and endorses comprehension to checkout at registration before leaving.  

## 2022-10-14 NOTE — ED Provider Notes (Signed)
[place] EMERGENCY DEPARTMENT AT [facility] Provider Note   CSN: [id number] Arrival date & time: 10/14/22  8657     History  Chief Complaint  Patient presents with   Cough   Sore Throat    Erica Walton is a 27 y.o. female with medical history of HIV, genital herpes, asthma, dyshidrotic eczema, Wilson's disease.  Patient presents to ED for evaluation of URI symptoms.  The patient reports that being Thursday she developed sore throat, body aches and chills, low-grade fever and cough.  The patient reports that there are multiple members of the household currently sick with unknown viral illness.  The patient goes on to state that she has tried to take over-the-counter medications to relieve the symptoms.  She states that her symptoms will become mildly alleviated with medications however always return.  She denies any chest pain, shortness of breath, nausea, vomiting, diarrhea.   Cough Associated symptoms: chills, myalgias and sore throat   Associated symptoms: no chest pain, no fever and no shortness of breath   Sore Throat Pertinent negatives include no chest pain and no shortness of breath.       Home Medications Prior to Admission medications   Medication Sig Start Date End Date Taking? Authorizing Provider  Cholecalciferol (VITAMIN D3) 25 MCG (1000 UT) CAPS Take 1 capsule (1,000 Units total) by mouth daily. 10/07/22   Alyson Reedy, FNP  atorvastatin (LIPITOR) 20 MG tablet Take 1 tablet (20 mg total) by mouth daily. 10/07/22   Alyson Reedy, FNP  budesonide-formoterol [facility]) 80-4.5 MCG/ACT inhaler Inhale 2 puffs into the lungs 2 (two) times daily. 10/05/22   Alyson Reedy, FNP  cyclobenzaprine (FLEXERIL) 5 MG tablet Take 1 tablet (5 mg total) by mouth 3 (three) times daily as needed for muscle spasms. 10/05/22   Alyson Reedy, FNP  dolutegravir (TIVICAY) 50 MG tablet TAKE 1 TABLET BY MOUTH EVERY EVENING. 07/13/22   Blanchard Kelch, NP   emtricitabine-tenofovir AF (DESCOVY) 200-25 MG tablet TAKE 1 TABLET BY MOUTH EVERY DAY IN THE EVENING 07/13/22   Blanchard Kelch, NP  etonogestrel-ethinyl estradiol (NUVARING) 0.12-0.015 MG/24HR vaginal ring Insert vaginally and leave in place for 3 consecutive weeks, then remove for 1 week. 07/09/22   Calvert Cantor, CNM  hydrocortisone valerate cream (WESTCORT) 0.2 % Apply 1 Application topically 2 (two) times daily. 10/05/22   Alyson Reedy, FNP      Allergies    Latex, Norco [hydrocodone-acetaminophen], and Penicillins    Review of Systems   Review of Systems  Constitutional:  Positive for chills. Negative for fever.  HENT:  Positive for congestion and sore throat.   Respiratory:  Positive for cough. Negative for shortness of breath.   Cardiovascular:  Negative for chest pain.  Musculoskeletal:  Positive for myalgias.  All other systems reviewed and are negative.   Physical Exam Updated Vital Signs BP (!) 152/81 (BP Location: Right Arm)   Pulse (!) 107   Temp 99.3 F (37.4 C) (Oral)   Resp 20   Ht 4\' 10"  (1.473 m)   Wt 67.6 kg   SpO2 97%   BMI 31.14 kg/m  Physical Exam Vitals and nursing note reviewed.  Constitutional:      General: She is not in acute distress.    Appearance: Normal appearance. She is not ill-appearing, toxic-appearing or diaphoretic.  HENT:     Head: Normocephalic and atraumatic.     Nose: Nose normal.     Mouth/Throat:     Mouth:  Mucous membranes are moist.     Pharynx: Posterior oropharyngeal erythema present. No oropharyngeal exudate.     Comments: Posterior oropharynx with erythema however no exudate.  Uvula midline, handling secretions appropriately, no drooling, no change in phonation. Eyes:     Extraocular Movements: Extraocular movements intact.     Conjunctiva/sclera: Conjunctivae normal.     Pupils: Pupils are equal, round, and reactive to light.  Cardiovascular:     Rate and Rhythm: Normal rate and regular rhythm.   Pulmonary:     Effort: Pulmonary effort is normal.     Breath sounds: Normal breath sounds. No wheezing.  Abdominal:     General: Abdomen is flat. Bowel sounds are normal.     Palpations: Abdomen is soft.     Tenderness: There is no abdominal tenderness.  Musculoskeletal:     Cervical back: Normal range of motion and neck supple. No tenderness.  Skin:    General: Skin is warm and dry.     Capillary Refill: Capillary refill takes less than 2 seconds.  Neurological:     Mental Status: She is alert and oriented to person, place, and time.     ED Results / Procedures / Treatments   Labs (all labs ordered are listed, but only abnormal results are displayed) Labs Reviewed  RESP PANEL BY RT-PCR (RSV, FLU A&B, COVID)  [id number]  GROUP A STREP BY PCR    EKG None  Radiology No results found.  Procedures Procedures   Medications Ordered in ED Medications - No data to display  ED Course/ Medical Decision Making/ A&P  Medical Decision Making  27 year old female presents to the ED for evaluation.  Please see HPI for further details.  On examination the patient is afebrile and tachycardic with pulse rate of 107.  Lung sounds are clear bilaterally and she is not hypoxic.  Abdomen soft and compressible throughout.  Neurological examination at baseline.  I suspect tachycardia secondary to viral illness.  Patient viral testing negative for RSV, COVID and flu.  Patient bacterial testing negative for strep pharyngitis.  Patient most likely suffering from unknown viral illness at this time.  Patient counseled on symptomatic care at home and she voiced understanding.  She was advised to follow-up with her PCP in 5 days for reevaluation.  She was advised to take ibuprofen for body aches and chills.  Advised to take Tylenol for headaches and fevers.  Advised to push fluids such as electrolyte soft mentation beverages and water.  She voiced understanding.  She will follow-up with her PCP for  further management.  She will return to the ED with any new or worsening symptoms.   Final Clinical Impression(s) / ED Diagnoses Final diagnoses:  Viral URI    Rx / DC Orders ED Discharge Orders     None         Al Decant, PA-C 10/14/22 1104    Tegeler, Canary Brim, MD 10/14/22 [phone]

## 2022-10-14 NOTE — ED Triage Notes (Signed)
Patient arrives with complaints of sore throat, fever, and cough x4 days.

## 2022-10-15 ENCOUNTER — Telehealth: Payer: Self-pay | Admitting: Obstetrics and Gynecology

## 2022-10-15 NOTE — Transitions of Care (Post Inpatient/ED Visit) (Signed)
   10/15/2022  Name: Erica Walton MRN: [id number] DOB: 1996-01-27  Today's TOC FU Call Status: Today's TOC FU Call Status:: Successful TOC FU Call Competed TOC FU Call Complete Date: 10/15/22  Transition Care Management Follow-up Telephone Call Date of Discharge: 10/14/22 Discharge Facility: [facility] ([facility] Type of Discharge: Emergency Department Reason for ED Visit: Respiratory Respiratory Diagnosis:  (URI/viral) How have you been since you were released from the hospital?: Better Any questions or concerns?: No  Items Reviewed: Did you receive and understand the discharge instructions provided?: Yes Medications obtained,verified, and reconciled?: Yes (Medications Reviewed) Any new allergies since your discharge?: No Dietary orders reviewed?: NA Do you have support at home?: Yes People in Home: spouse Name of Support/Comfort Primary Source: Marlinda Mike  Medications Reviewed Today: Medications Reviewed Today     Reviewed by Danie Chandler, RN (Registered Nurse) on 10/15/22 at 1530  Med List Status: <None>   Medication Order Taking? Sig Documenting Provider Last Dose Status Informant  atorvastatin (LIPITOR) 20 MG tablet [id number]  Take 1 tablet (20 mg total) by mouth daily. Alyson Reedy, FNP  Active   budesonide-formoterol [facility]) 80-4.5 MCG/ACT inhaler [id number]  Inhale 2 puffs into the lungs 2 (two) times daily. Alyson Reedy, FNP  Active   Cholecalciferol (VITAMIN D3) 25 MCG (1000 UT) CAPS [id number]  Take 1 capsule (1,000 Units total) by mouth daily. Alyson Reedy, FNP  Active   cyclobenzaprine (FLEXERIL) 5 MG tablet [id number]  Take 1 tablet (5 mg total) by mouth 3 (three) times daily as needed for muscle spasms. Alyson Reedy, FNP  Active   dolutegravir (TIVICAY) 50 MG tablet [id number] No TAKE 1 TABLET BY MOUTH EVERY EVENING. Blanchard Kelch, NP Taking Active   emtricitabine-tenofovir AF (DESCOVY) 200-25 MG tablet [id number] No TAKE 1 TABLET BY  MOUTH EVERY DAY IN THE EVENING Blanchard Kelch, NP Taking Active   etonogestrel-ethinyl estradiol (NUVARING) 0.12-0.015 MG/24HR vaginal ring [id number] No Insert vaginally and leave in place for 3 consecutive weeks, then remove for 1 week. Calvert Cantor, CNM Taking Active   hydrocortisone valerate cream (WESTCORT) 0.2 % [id number]  Apply 1 Application topically 2 (two) times daily. Alyson Reedy, FNP  Active            Home Care and Equipment/Supplies: Were Home Health Services Ordered?: NA Any new equipment or medical supplies ordered?: NA  Functional Questionnaire: Do you need assistance with bathing/showering or dressing?: No Do you need assistance with meal preparation?: No Do you need assistance with eating?: No Do you have difficulty maintaining continence: No Do you need assistance with getting out of bed/getting out of a chair/moving?: No Do you have difficulty managing or taking your medications?: No  Follow up appointments reviewed: PCP Follow-up appointment confirmed?: Yes Date of PCP follow-up appointment?: 11/16/22 Follow-up Provider: Alyson Reedy Specialist [facility] Follow-up appointment confirmed?: Yes Date of Specialist follow-up appointment?: 10/19/22 Follow-Up Specialty Provider:: Rexene Alberts Do you need transportation to your follow-up appointment?: No Do you understand care options if your condition(s) worsen?: Yes-patient verbalized understanding  SDOH Interventions Today    Flowsheet Row Most Recent Value  SDOH Interventions   Alcohol Usage Interventions Intervention Not Indicated (Score <7)  Stress Interventions Intervention Not Indicated      Kathi Der RN, BSN [place]  [facility] Care Management Coordinator - Managed [place] High Risk [phone]

## 2022-10-19 ENCOUNTER — Ambulatory Visit: Payer: Medicaid Other | Admitting: Infectious Diseases

## 2022-10-26 ENCOUNTER — Encounter (HOSPITAL_BASED_OUTPATIENT_CLINIC_OR_DEPARTMENT_OTHER): Payer: Self-pay | Admitting: Family Medicine

## 2022-10-27 ENCOUNTER — Ambulatory Visit (HOSPITAL_BASED_OUTPATIENT_CLINIC_OR_DEPARTMENT_OTHER): Payer: Medicaid Other | Admitting: Family Medicine

## 2022-10-27 ENCOUNTER — Encounter (HOSPITAL_BASED_OUTPATIENT_CLINIC_OR_DEPARTMENT_OTHER): Payer: Self-pay | Admitting: Family Medicine

## 2022-10-27 VITALS — BP 126/81 | HR 90 | Ht <= 58 in | Wt 149.0 lb

## 2022-10-27 DIAGNOSIS — H65192 Other acute nonsuppurative otitis media, left ear: Secondary | ICD-10-CM

## 2022-10-27 MED ORDER — LEVOFLOXACIN 500 MG PO TABS
500.00 mg | ORAL_TABLET | Freq: Every day | ORAL | 0 refills | Status: AC
Start: 2022-10-27 — End: 2022-11-03

## 2022-10-27 NOTE — Progress Notes (Unsigned)
   Acute Office Visit  Subjective:     Patient ID: Erica Walton, female    DOB: January 04, 1996, 27 y.o.   MRN: [id number]  Chief Complaint  Patient presents with   Ear Pain    Pt here for having left ear pain, pt stated she has been having some swelling and pain inside and outside of ear    L ear pain- almost 1 week- she has tried to ignore it  Pain on the outside and inside    Review of Systems  Constitutional:  Negative for chills, fever and malaise/fatigue.  HENT:  Positive for ear pain, hearing loss (L side sounds muffled) and tinnitus. Negative for congestion, ear discharge, sinus pain and sore throat.   Respiratory:  Positive for cough. Negative for shortness of breath.   Cardiovascular:  Negative for chest pain and palpitations.  Gastrointestinal:  Negative for abdominal pain, nausea and vomiting.  Musculoskeletal:  Negative for myalgias.  Psychiatric/Behavioral:  Negative for depression. The patient is not nervous/anxious.        Objective:    BP 126/81 (BP Location: Left Arm, Patient Position: Sitting, Cuff Size: Normal)   Pulse 90   Ht 4\' 10"  (1.473 m)   Wt 149 lb (67.6 kg)   SpO2 100%   BMI 31.14 kg/m   Physical Exam     Assessment & Plan:  ***  Return if symptoms worsen or fail to improve.  Alyson Reedy, FNP

## 2022-10-28 DIAGNOSIS — H65192 Other acute nonsuppurative otitis media, left ear: Secondary | ICD-10-CM | POA: Insufficient documentation

## 2022-11-04 ENCOUNTER — Ambulatory Visit: Payer: Medicaid Other | Admitting: Family

## 2022-11-04 ENCOUNTER — Other Ambulatory Visit (HOSPITAL_COMMUNITY): Payer: Self-pay

## 2022-11-06 ENCOUNTER — Encounter (HOSPITAL_BASED_OUTPATIENT_CLINIC_OR_DEPARTMENT_OTHER): Payer: Self-pay | Admitting: Family Medicine

## 2022-11-10 ENCOUNTER — Emergency Department (HOSPITAL_COMMUNITY)
Admission: EM | Admit: 2022-11-10 | Discharge: 2022-11-10 | Disposition: A | Discharge status: Home / Self Care | Payer: Medicaid Other | Attending: Emergency Medicine | Admitting: Emergency Medicine

## 2022-11-10 ENCOUNTER — Other Ambulatory Visit: Payer: Self-pay

## 2022-11-10 ENCOUNTER — Encounter (HOSPITAL_COMMUNITY): Payer: Self-pay

## 2022-11-10 DIAGNOSIS — H6092 Unspecified otitis externa, left ear: Secondary | ICD-10-CM | POA: Insufficient documentation

## 2022-11-10 DIAGNOSIS — H60502 Unspecified acute noninfective otitis externa, left ear: Secondary | ICD-10-CM

## 2022-11-10 DIAGNOSIS — I889 Nonspecific lymphadenitis, unspecified: Secondary | ICD-10-CM | POA: Diagnosis not present

## 2022-11-10 DIAGNOSIS — H9202 Otalgia, left ear: Secondary | ICD-10-CM | POA: Diagnosis present

## 2022-11-10 MED ORDER — CIPROFLOXACIN HCL 500 MG PO TABS: 500.0000 mg | ORAL_TABLET | Freq: Two times a day (BID) | ORAL | 0 refills | Status: DC

## 2022-11-10 MED ORDER — CLINDAMYCIN HCL 300 MG PO CAPS: 300.0000 mg | ORAL_CAPSULE | Freq: Three times a day (TID) | ORAL | 0 refills | Status: AC

## 2022-11-10 MED ORDER — OFLOXACIN 0.3 % OT SOLN: 5.0000 [drp] | Freq: Two times a day (BID) | OTIC | 0 refills | Status: DC

## 2022-11-10 NOTE — ED Provider Notes (Signed)
[place] EMERGENCY DEPARTMENT AT [facility] Provider Note   CSN: [id number] Arrival date & time: 11/10/22  1620     History Chief Complaint  Patient presents with   Otalgia    HPI Erica Walton is a 27 y.o. female presenting for chief complaint of left ear pain.  Denies fevers chills nausea vomiting syncope shortness of breath has an extensive medical history.  Was seen by PCP started on Levaquin.  Only has had interval worsening.  No known sick contacts..   Patient's recorded medical, surgical, social, medication list and allergies were reviewed in the Snapshot window as part of the initial history.   Review of Systems   Review of Systems  Constitutional:  Negative for chills and fever.  HENT:  Positive for ear pain. Negative for sore throat.   Eyes:  Negative for pain and visual disturbance.  Respiratory:  Negative for cough and shortness of breath.   Cardiovascular:  Negative for chest pain and palpitations.  Gastrointestinal:  Negative for abdominal pain and vomiting.  Genitourinary:  Negative for dysuria and hematuria.  Musculoskeletal:  Negative for arthralgias and back pain.  Skin:  Negative for color change and rash.  Neurological:  Negative for seizures and syncope.  All other systems reviewed and are negative.   Physical Exam Updated Vital Signs BP 129/89 (BP Location: Left Arm)   Pulse 95   Temp 98.5 F (36.9 C)   Resp 18   Ht 4\' 10"  (1.473 m)   Wt 67.6 kg   SpO2 97%   BMI 31.15 kg/m  Physical Exam Vitals and nursing note reviewed.  Constitutional:      General: She is not in acute distress.    Appearance: She is well-developed.  HENT:     Head: Normocephalic and atraumatic.     Right Ear: Tympanic membrane, ear canal and external ear normal.     Left Ear: Tympanic membrane normal.     Ears:     Comments: Extensive erythema of the left ear and soft tissue. Eyes:     Conjunctiva/sclera: Conjunctivae normal.  Neck:     Comments:  Palpable lymph node with tenderness to palpation in the left anterior neck. Cardiovascular:     Rate and Rhythm: Normal rate and regular rhythm.     Heart sounds: No murmur heard. Pulmonary:     Effort: Pulmonary effort is normal. No respiratory distress.     Breath sounds: Normal breath sounds.  Abdominal:     General: There is no distension.     Palpations: Abdomen is soft.     Tenderness: There is no abdominal tenderness. There is no right CVA tenderness or left CVA tenderness.  Musculoskeletal:        General: No swelling or tenderness. Normal range of motion.     Cervical back: Neck supple.  Skin:    General: Skin is warm and dry.  Neurological:     General: No focal deficit present.     Mental Status: She is alert and oriented to person, place, and time. Mental status is at baseline.     Cranial Nerves: No cranial nerve deficit.      ED Course/ Medical Decision Making/ A&P    Procedures Procedures   Medications Ordered in ED Medications - No data to display  Medical Decision Making:    Erica Walton is a 27 y.o. female who presented to the ED today with left ear pain and left anterior neck swelling  with pain detailed above.    Patient's history of present on his physical exam findings are most consistent with otitis externa of the left ear.She is in no acute distress but does have HIV.  She has failed a Levaquin course for treatment.  Additionally, she seems to develop bacterial lymphadenitis of the left posterior auricular chain. Given her immunocompromisation in the setting of HIV infection, patient is going to require aggressive treatment and management.  Will extend fluoroquinolone coverage for pseudomonal coverage of otitis externa, will add on otic suspension of antipseudomonal agents and will add on streptococcal and staphylococcal coverage for the treatment of bacterial lymphadenitis treatment with clindamycin (penicillin anaphylactic).  Patient required to be  reassessed by her PCP within 72 hours for ongoing care management given her high risk of progression to more sinister disease such as mastoiditis.  Currently clinically not consistent with these pathologies still at high risk for progression. Informed patient of these findings patient was in agreement on ongoing care and management. Clinical Impression:  1. Acute otitis externa of left ear, unspecified type   2. Lymphadenitis      Discharge   Final Clinical Impression(s) / ED Diagnoses Final diagnoses:  Acute otitis externa of left ear, unspecified type  Lymphadenitis    Rx / DC Orders ED Discharge Orders          Ordered    ofloxacin (FLOXIN) 0.3 % OTIC solution  2 times daily        11/10/22 2120    ciprofloxacin (CIPRO) 500 MG tablet  Every 12 hours        11/10/22 2120    clindamycin (CLEOCIN) 300 MG capsule  3 times daily        11/10/22 2133              Glyn Ade, MD 11/10/22 2133

## 2022-11-10 NOTE — ED Triage Notes (Signed)
Pt c/o left ear pain and unable to hear out of left earx3-4wks

## 2022-11-16 ENCOUNTER — Ambulatory Visit (HOSPITAL_BASED_OUTPATIENT_CLINIC_OR_DEPARTMENT_OTHER): Payer: Medicaid Other | Admitting: Family Medicine

## 2022-12-10 ENCOUNTER — Other Ambulatory Visit: Payer: Self-pay | Admitting: Medical Genetics

## 2022-12-10 DIAGNOSIS — Z006 Encounter for examination for normal comparison and control in clinical research program: Secondary | ICD-10-CM

## 2023-01-01 ENCOUNTER — Encounter (HOSPITAL_BASED_OUTPATIENT_CLINIC_OR_DEPARTMENT_OTHER): Payer: Self-pay | Admitting: Family Medicine

## 2023-01-05 MED ORDER — CLOTRIMAZOLE-BETAMETHASONE 1-0.05 % EX CREA: 1.0000 | TOPICAL_CREAM | Freq: Two times a day (BID) | CUTANEOUS | 0 refills | Status: DC

## 2023-02-09 DIAGNOSIS — Z20822 Contact with and (suspected) exposure to covid-19: Secondary | ICD-10-CM | POA: Insufficient documentation

## 2023-02-09 DIAGNOSIS — R059 Cough, unspecified: Secondary | ICD-10-CM | POA: Diagnosis not present

## 2023-02-09 DIAGNOSIS — J189 Pneumonia, unspecified organism: Secondary | ICD-10-CM | POA: Insufficient documentation

## 2023-02-09 DIAGNOSIS — R0789 Other chest pain: Secondary | ICD-10-CM | POA: Diagnosis not present

## 2023-02-09 DIAGNOSIS — Z21 Asymptomatic human immunodeficiency virus [HIV] infection status: Secondary | ICD-10-CM | POA: Insufficient documentation

## 2023-02-09 DIAGNOSIS — J45909 Unspecified asthma, uncomplicated: Secondary | ICD-10-CM | POA: Insufficient documentation

## 2023-02-10 ENCOUNTER — Emergency Department (HOSPITAL_COMMUNITY): Payer: Medicaid Other

## 2023-02-10 ENCOUNTER — Other Ambulatory Visit: Payer: Self-pay

## 2023-02-10 ENCOUNTER — Encounter (HOSPITAL_COMMUNITY): Payer: Self-pay

## 2023-02-10 ENCOUNTER — Emergency Department (HOSPITAL_COMMUNITY)
Admission: EM | Admit: 2023-02-10 | Discharge: 2023-02-10 | Disposition: A | Discharge status: Home / Self Care | Payer: Medicaid Other | Attending: Emergency Medicine | Admitting: Emergency Medicine

## 2023-02-10 DIAGNOSIS — J189 Pneumonia, unspecified organism: Secondary | ICD-10-CM | POA: Diagnosis not present

## 2023-02-10 DIAGNOSIS — R059 Cough, unspecified: Secondary | ICD-10-CM | POA: Diagnosis not present

## 2023-02-10 LAB — RESP PANEL BY RT-PCR (RSV, FLU A&B, COVID)  RVPGX2
Influenza A by PCR: NEGATIVE
Influenza B by PCR: NEGATIVE
Resp Syncytial Virus by PCR: NEGATIVE
SARS Coronavirus 2 by RT PCR: NEGATIVE

## 2023-02-10 MED ORDER — DOXYCYCLINE HYCLATE 100 MG PO TABS
100.0000 mg | ORAL_TABLET | Freq: Once | ORAL | Status: AC
  Administered 2023-02-10: 100 mg via ORAL
  Filled 2023-02-10: qty 1

## 2023-02-10 MED ORDER — DOXYCYCLINE HYCLATE 100 MG PO CAPS: 100.0000 mg | ORAL_CAPSULE | Freq: Two times a day (BID) | ORAL | 0 refills | Status: AC

## 2023-02-10 NOTE — ED Triage Notes (Signed)
Generalized body aches, headache, nonproductive cough, and chills x 2 days.

## 2023-02-10 NOTE — Discharge Instructions (Signed)
You were evaluated in the Emergency Department and after careful evaluation, we did not find any emergent condition requiring admission or further testing in the hospital.  Your exam/testing today is overall reassuring.  Symptoms seem to be due to pneumonia.  Take the doxycycline antibiotic as directed.  Tylenol and Motrin for discomfort.  Please return to the Emergency Department if you experience any worsening of your condition.   Thank you for allowing [place] to be a part of your care.

## 2023-02-10 NOTE — ED Provider Notes (Signed)
[facility] Emergency Department Provider Note MRN:  [id number]  Arrival date & time: 02/10/23     Chief Complaint   Cough   History of Present Illness   Erica Walton is a 27 y.o. year-old female with a history of HIV presenting to the ED with chief complaint of cough.  Cough, malaise, chest pressure for the past few days.  Feels similar to prior episodes of pneumonia.  Denies fever.  Review of Systems  A thorough review of systems was obtained and all systems are negative except as noted in the HPI and PMH.   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   Supervision of high risk pregnancy, antepartum 11/17/2021          Nursing Staff  Provider  Office Location   [place]  Dating   Early scan   [facility] Model  [x ] Traditional  [ ]  Centering  [ ]  Mom-Baby Dyad        Language   english  Anatomy US   complete  Flu Vaccine   03/02/22  Genetic/Carrier Screen   NIPS:   LR female  AFP:     [place]:  TDaP Vaccine    04/07/22  Hgb A1C or   GTT  Early   Third trimester   COVID Vaccine  No     LAB RESULTS   Rhogam      Blood T    Past Surgical History:  Procedure Laterality Date   APPENDECTOMY     LAPAROSCOPIC APPENDECTOMY N/A 07/22/2019   Procedure: APPENDECTOMY LAPAROSCOPIC;  Surgeon: Carolan Shiver, MD;  Location: [facility] ORS;  Service: General;  Laterality: N/A;    Family History  Problem Relation Age of Onset   Asthma Mother    Wilson's disease Mother    Obesity  Father        history of addiction   Hypertension Father    Asthma Father    Cancer Father    Asthma Sister    Asthma Brother    Obesity Maternal Grandmother    Diabetes Maternal Grandmother    Asthma Maternal Grandmother    Obesity Paternal Grandfather    Hypertension Paternal Grandfather    Heart disease Paternal Grandfather    Cancer Paternal Grandfather    Diabetes Maternal Uncle    Breast cancer Other    Stroke Neg Hx     Social History   Socioeconomic History   Marital status: Married    Spouse name: Marlinda Mike    Number of children: 4   Years of education: Not on file   Highest education level: Not on file  Occupational History   Not on file  Tobacco Use   Smoking status: Never   Smokeless tobacco: Never  Vaping Use   Vaping status: Never Used  Substance and Sexual Activity   Alcohol use: Not Currently    Comment: 2018   Drug use: Never   Sexual activity: Yes  Birth control/protection: None    Comment: DECLINED CONDOMS  Other Topics Concern   Not on file  Social History Narrative   Not on file   Social Determinants of Health   Financial Resource Strain: Low Risk  (06/18/2022)   Overall Financial Resource Strain (CARDIA)    Difficulty of Paying Living Expenses: Not hard at all  Food Insecurity: No Food Insecurity (06/18/2022)   Hunger Vital Sign    Worried About Running Out of Food in the Last Year: Never true    Ran Out of Food in the Last Year: Never true  Transportation Needs: No Transportation Needs (06/18/2022)   PRAPARE - Administrator, Civil Service (Medical): No    Lack of Transportation (Non-Medical): No  Physical Activity: Not on file  Stress: No Stress Concern Present (10/15/2022)   [employer] of Occupational Health - Occupational Stress Questionnaire    Feeling of Stress : Only a little  Social Connections: Not on file  Intimate Partner Violence: Not At Risk (06/18/2022)   Humiliation, Afraid, Rape, and Kick  questionnaire    Fear of Current or Ex-Partner: No    Emotionally Abused: No    Physically Abused: No    Sexually Abused: No     Physical Exam   Vitals:   02/10/23 0003 02/10/23 0415  BP: (!) 120/90 120/85  Pulse: (!) 120 96  Resp: 18 (!) 27  Temp: 99.3 F (37.4 C)   SpO2: 100% 99%    CONSTITUTIONAL: Well-appearing, NAD NEURO/PSYCH:  Alert and oriented x 3, no focal deficits EYES:  eyes equal and reactive ENT/NECK:  no LAD, no JVD CARDIO: Regular rate, well-perfused, normal S1 and S2 PULM:  CTAB no wheezing or rhonchi GI/GU:  non-distended, non-tender MSK/SPINE:  No gross deformities, no edema SKIN:  no rash, atraumatic   *Additional and/or pertinent findings included in MDM below  Diagnostic and Interventional Summary    EKG Interpretation Date/Time:  Wednesday February 10 2023 04:06:49 EDT Ventricular Rate:  96 PR Interval:  149 QRS Duration:  100 QT Interval:  355 QTC Calculation: 449 R Axis:   -12  Text Interpretation: Sinus rhythm Low voltage, precordial leads RSR' in V1 or V2, right VCD or RVH Borderline T abnormalities, anterior leads No significant change was found Confirmed by Kennis Carina [id number]) on 02/10/2023 4:34:15 AM       Labs Reviewed  RESP PANEL BY RT-PCR (RSV, FLU A&B, COVID)  [id number]    DG Chest 2 View  Final Result      Medications  doxycycline (VIBRA-TABS) tablet 100 mg (100 mg Oral Given 02/10/23 0358)     Procedures  /  Critical Care Procedures  ED Course and Medical Decision Making  Initial Impression and Ddx History of HIV, reassuring helper T-cell count as of 1 year ago.  Nontoxic no increased work of breathing, vitals overall reassuring.  Was tachycardic in triage but on my evaluation heart rate in the 90s.  Suspect pneumonia.  Screening EKG given the chest pressure highly doubt ACS, highly doubt PE.  Past medical/surgical history that increases complexity of ED encounter: HIV  Interpretation of Diagnostics I personally  reviewed the x-ray and my interpretation is as follows: Pneumonia, no pneumothorax  EKG is unchanged from prior  Patient Reassessment and Ultimate Disposition/Management     Discharge on antibiotics.  Patient management required discussion with the following services or consulting groups:  None  Complexity of Problems Addressed Acute illness or injury that poses threat of  life of bodily function  Additional Data Reviewed and Analyzed Further history obtained from: Prior labs/imaging results  Additional Factors Impacting ED Encounter Risk Prescriptions and Consideration of hospitalization  Elmer Sow. Pilar Plate, MD [facility] Emergency Medicine [facility] [email or web address]  Final Clinical Impressions(s) / ED Diagnoses     ICD-10-CM   1. Community acquired pneumonia, unspecified laterality  J18.9       ED Discharge Orders          Ordered    doxycycline (VIBRAMYCIN) 100 MG capsule  2 times daily        02/10/23 0436             Discharge Instructions Discussed with and Provided to Patient:    Discharge Instructions      You were evaluated in the Emergency Department and after careful evaluation, we did not find any emergent condition requiring admission or further testing in the hospital.  Your exam/testing today is overall reassuring.  Symptoms seem to be due to pneumonia.  Take the doxycycline antibiotic as directed.  Tylenol and Motrin for discomfort.  Please return to the Emergency Department if you experience any worsening of your condition.   Thank you for allowing [place] to be a part of your care.      Sabas Sous, MD 02/10/23 [phone]

## 2023-03-11 ENCOUNTER — Ambulatory Visit: Payer: Medicaid Other | Admitting: Infectious Diseases

## 2023-03-11 ENCOUNTER — Encounter (HOSPITAL_BASED_OUTPATIENT_CLINIC_OR_DEPARTMENT_OTHER): Payer: Self-pay | Admitting: Family Medicine

## 2023-04-05 ENCOUNTER — Ambulatory Visit: Payer: Medicaid Other | Admitting: Infectious Diseases

## 2023-04-09 ENCOUNTER — Other Ambulatory Visit: Payer: Self-pay | Admitting: Infectious Diseases

## 2023-04-09 DIAGNOSIS — B20 Human immunodeficiency virus [HIV] disease: Secondary | ICD-10-CM

## 2023-04-21 NOTE — L&D Delivery Note (Addendum)
 DONNIE GEDEON is a 28 y.o. female [phone] with IUP at [redacted]w[redacted]d admitted for spontaneous onset of labor.  She progressed rapidly without augmentation to complete and pushed 2 times to deliver.  Cord clamping delayed by several minutes then clamped by CNM and cut by patient.    Delivery Note At 7:09 PM a viable female was delivered via Vaginal, Spontaneous (Presentation: Right Occiput Anterior).  APGAR: 8, 9 ; weight pending.   Placenta status: Spontaneous, Intact.  Cord: 3 vessels with the following complications: None. TXA given with delivery.  Anesthesia: None Episiotomy: None Lacerations: None Suture Repair: n/a Est. Blood Loss (mL):  250  Mom to postpartum.  Baby to Couplet care / Skin to Skin.  Aleck CHRISTELLA Fireman CNM 03/18/2024, 7:33 PM

## 2023-05-13 ENCOUNTER — Other Ambulatory Visit: Payer: Self-pay

## 2023-05-13 ENCOUNTER — Emergency Department (HOSPITAL_COMMUNITY): Payer: Medicaid Other

## 2023-05-13 ENCOUNTER — Encounter (HOSPITAL_COMMUNITY): Payer: Self-pay

## 2023-05-13 ENCOUNTER — Emergency Department (HOSPITAL_COMMUNITY)
Admission: EM | Admit: 2023-05-13 | Discharge: 2023-05-14 | Disposition: A | Discharge status: Home / Self Care | Payer: Medicaid Other | Attending: Emergency Medicine | Admitting: Emergency Medicine

## 2023-05-13 DIAGNOSIS — J189 Pneumonia, unspecified organism: Secondary | ICD-10-CM | POA: Diagnosis not present

## 2023-05-13 DIAGNOSIS — J45909 Unspecified asthma, uncomplicated: Secondary | ICD-10-CM | POA: Insufficient documentation

## 2023-05-13 DIAGNOSIS — T148XXA Other injury of unspecified body region, initial encounter: Secondary | ICD-10-CM

## 2023-05-13 DIAGNOSIS — R0789 Other chest pain: Secondary | ICD-10-CM

## 2023-05-13 DIAGNOSIS — Z91040 Latex allergy status: Secondary | ICD-10-CM | POA: Insufficient documentation

## 2023-05-13 DIAGNOSIS — R071 Chest pain on breathing: Secondary | ICD-10-CM | POA: Diagnosis not present

## 2023-05-13 DIAGNOSIS — Z21 Asymptomatic human immunodeficiency virus [HIV] infection status: Secondary | ICD-10-CM | POA: Insufficient documentation

## 2023-05-13 DIAGNOSIS — S29011A Strain of muscle and tendon of front wall of thorax, initial encounter: Secondary | ICD-10-CM | POA: Diagnosis not present

## 2023-05-13 DIAGNOSIS — M546 Pain in thoracic spine: Secondary | ICD-10-CM | POA: Insufficient documentation

## 2023-05-13 LAB — BASIC METABOLIC PANEL
Anion gap: 10 (ref 5–15)
BUN: 7 mg/dL (ref 6–20)
CO2: 19 mmol/L — ABNORMAL LOW (ref 22–32)
Calcium: 8.9 mg/dL (ref 8.9–10.3)
Chloride: 109 mmol/L (ref 98–111)
Creatinine, Ser: 0.71 mg/dL (ref 0.44–1.00)
GFR, Estimated: >60 mL/min (ref >60)
Glucose, Bld: 86 mg/dL (ref 70–99)
Potassium: 4 mmol/L (ref 3.5–5.1)
Sodium: 138 mmol/L (ref 135–145)

## 2023-05-13 LAB — CBC WITH DIFFERENTIAL/PLATELET
Abs Immature Granulocytes: 0.02 10*3/uL (ref 0.00–0.07)
Basophils Absolute: 0.1 10*3/uL (ref 0.0–0.1)
Basophils Relative: 1 %
Eosinophils Absolute: 0.3 10*3/uL (ref 0.0–0.5)
Eosinophils Relative: 4 %
HCT: 44 % (ref 36.0–46.0)
Hemoglobin: 14.5 g/dL (ref 12.0–15.0)
Immature Granulocytes: 0 %
Lymphocytes Relative: 31 %
Lymphs Abs: 2.5 10*3/uL (ref 0.7–4.0)
MCH: 28.1 pg (ref 26.0–34.0)
MCHC: 33 g/dL (ref 30.0–36.0)
MCV: 85.3 fL (ref 80.0–100.0)
Monocytes Absolute: 0.5 10*3/uL (ref 0.1–1.0)
Monocytes Relative: 6 %
Neutro Abs: 4.7 10*3/uL (ref 1.7–7.7)
Neutrophils Relative %: 58 %
Platelets: 379 10*3/uL (ref 150–400)
RBC: 5.16 MIL/uL — ABNORMAL HIGH (ref 3.87–5.11)
RDW: 13.2 % (ref 11.5–15.5)
WBC: 8.1 10*3/uL (ref 4.0–10.5)
nRBC: 0 % (ref 0.0–0.2)

## 2023-05-13 LAB — D-DIMER, QUANTITATIVE: D-Dimer, Quant: <0.27 ug{FEU}/mL (ref 0.00–0.50)

## 2023-05-13 LAB — TROPONIN I (HIGH SENSITIVITY): Troponin I (High Sensitivity): <2 ng/L (ref <18)

## 2023-05-13 NOTE — ED Provider Triage Note (Signed)
Emergency Medicine Provider Triage Evaluation Note  Erica Walton , a 28 y.o. female  was evaluated in triage.  Pt complains of back and chest pain.  Sudden onset today between her shoulder blades.  He radiates around to the front of her chest.  Worse with breathing and movement.  She is on NuvaRing birth control..  Review of Systems  Positive: Pleuritic chest pain and back pain Negative: Fever  Physical Exam  BP 112/84 (BP Location: Left Arm)   Pulse (!) 101   Temp 98.1 F (36.7 C)   Resp 18   Ht 4' 10.5" (1.486 m)   Wt 70.3 kg   LMP 04/17/2023 (Exact Date)   SpO2 100%   BMI 31.84 kg/m  Gen:   Awake, no distress   Resp:  Normal effort  MSK:   Moves extremities without difficulty  Other:    Medical Decision Making  Medically screening exam initiated at 8:46 PM.  Appropriate orders placed.  Erica Walton was informed that the remainder of the evaluation will be completed by another provider, this initial triage assessment does not replace that evaluation, and the importance of remaining in the ED until their evaluation is complete.     Arthor Captain, PA-C 05/13/23 2049

## 2023-05-13 NOTE — ED Triage Notes (Signed)
Pt arrived from home via POV c/o pain between shoulder blades that radiates to front of chest that began last night. Pt states that it hurts with every breath 10/10

## 2023-05-14 LAB — TROPONIN I (HIGH SENSITIVITY): Troponin I (High Sensitivity): <2 ng/L (ref <18)

## 2023-05-14 MED ORDER — LIDOCAINE 5 % EX PTCH
1.0000 | MEDICATED_PATCH | Freq: Every day | CUTANEOUS | 0 refills | Status: DC | PRN
Start: 2023-05-14 — End: 2023-08-24

## 2023-05-14 MED ORDER — LIDOCAINE 5 % EX PTCH
1.0000 | MEDICATED_PATCH | Freq: Once | CUTANEOUS | Status: DC
  Administered 2023-05-14: 1 via TRANSDERMAL
  Filled 2023-05-14: qty 1

## 2023-05-14 MED ORDER — ACETAMINOPHEN 325 MG PO TABS: 650.0000 mg | ORAL_TABLET | Freq: Four times a day (QID) | ORAL | 0 refills | Status: AC | PRN

## 2023-05-14 MED ORDER — OXYCODONE HCL 5 MG PO TABS
5.0000 mg | ORAL_TABLET | Freq: Once | ORAL | Status: AC
  Administered 2023-05-14: 5 mg via ORAL
  Filled 2023-05-14: qty 1

## 2023-05-14 MED ORDER — OXYCODONE HCL 5 MG PO TABS: 5.0000 mg | ORAL_TABLET | Freq: Four times a day (QID) | ORAL | 0 refills | Status: DC | PRN

## 2023-05-14 NOTE — ED Provider Notes (Signed)
[place] [facility]ERG[facility] DE[facility]MENT AT [facility] Provider Note  CSN: [id number] Arrival date & time: 05/13/23 1929  Chief Complaint(s) Chest Pain  HPI Erica Walton is a 28 y.o. female with past medical history as below, significant for HIV compliant w/ antivirals, wilson's disease, HLD who presents to the ED with complaint of back pain.   Patient reports that she leaned over to pick up her daughter and had pain to her upper back.  Ongoing around 24 hours.  Took Flexeril and Tylenol which did not alleviate her symptoms.  Pain worsened with torso twisting, arm movement, neck movement at times.  Position changes.  No dyspnea, nausea, vomiting, diaphoresis.,  No change in bowel or bladder function.  No fevers.  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   Supervision of high risk pregnancy, antepartum 11/17/2021          Nursing Staff  Provider  Office Location   [place]  Dating   Early scan   [facility] Model  [x ] Traditional  [ ]  Centering  [ ]  Mom-Baby Dyad        Language   english  Anatomy US   complete  Flu Vaccine   03/02/22  Genetic/Carrier Screen   NIPS:   LR female  AFP:     Horizon:  TDaP Vaccine    04/07/22  Hgb A1C or   GTT  Early   Third trimester   COVID Vaccine  No     LAB RESULTS   Rhogam      Blood T   Patient Active Problem List   Diagnosis Date Noted   Acute otitis media with effusion of left ear 10/28/2022   Chronic  bilateral low back pain without sciatica 10/05/2022   Mixed hyperlipidemia 06/23/2021   Herpes 10/06/2013   Asthma 11/17/2011   Human immunodeficiency virus (HIV) disease (HCC) 11/17/2011   Home Medication(s) Prior to Admission medications   Medication Sig Start Date End Date Taking? Authorizing Provider  atorvastatin (LIPITOR) 20 MG tablet Take 1 tablet (20 mg total) by mouth daily. 10/07/22   Alyson Reedy, FNP  budesonide-formoterol [facility]) 80-4.5 MCG/ACT inhaler Inhale 2 puffs into the lungs 2 (two) times daily. 10/05/22   Alyson Reedy, FNP  Cholecalciferol (VITAMIN D3) 25 MCG (1000 UT) CAPS Take 1 capsule (1,000 Units total) by mouth daily. Patient not taking: Reported on 11/10/2022 10/07/22   Alyson Reedy, FNP  ciprofloxacin (CIPRO) 500 MG tablet Take 1 tablet (500 mg total) by mouth every 12 (twelve) hours. 11/10/22   Glyn Ade, MD  clotrimazole-betamethasone (LOTRISONE) cream Apply 1 Application topically 2 (two) times daily. Use twice daily for two weeks. 01/05/23   Alyson Reedy, FNP  cyclobenzaprine (FLEXERIL) 5 MG tablet Take 1 tablet (5 mg total) by mouth 3 (three) times daily as needed for muscle spasms. 10/05/22   Alyson Reedy, FNP  DESCOVY 200-25 MG tablet TAKE 1 TABLET  BY MOUTH EVERY DAY IN THE EVENING 04/09/23   Blanchard Kelch, NP  etonogestrel-ethinyl estradiol (NUVARING) 0.12-0.015 MG/24HR vaginal ring Insert vaginally and leave in place for 3 consecutive weeks, then remove for 1 week. Patient taking differently: Place 1 each vaginally every 28 (twenty-eight) days. 07/09/22   Calvert Cantor, CNM  hydrocortisone valerate cream (WESTCORT) 0.2 % Apply 1 Application topically 2 (two) times daily. Patient taking differently: Apply 1 Application topically daily as needed (For itching). 10/05/22   Alyson Reedy, FNP  ofloxacin (FLOXIN) 0.3 % OTIC solution Place 5 drops into the left ear 2 (two) times daily. 11/10/22   Glyn Ade, MD  TIVICAY  50 MG tablet TAKE 1 TABLET BY MOUTH EVERY DAY IN THE EVENING 04/09/23   Blanchard Kelch, NP                                                                                                                                    Past Surgical History Past Surgical History:  Procedure Laterality Date   APPENDECTOMY     LAPAROSCOPIC APPENDECTOMY N/A 07/22/2019   Procedure: APPENDECTOMY LAPAROSCOPIC;  Surgeon: Carolan Shiver, MD;  Location: [facility] ORS;  Service: General;  Laterality: N/A;   Family History Family History  Problem Relation Age of Onset   Asthma Mother    Wilson's disease Mother    Obesity Father        history of addiction   Hypertension Father    Asthma Father    Cancer Father    Asthma Sister    Asthma Brother    Obesity Maternal Grandmother    Diabetes Maternal Grandmother    Asthma Maternal Grandmother    Obesity Paternal Grandfather    Hypertension Paternal Grandfather    Heart disease Paternal Grandfather    Cancer Paternal Grandfather    Diabetes Maternal Uncle    Breast cancer Other    Stroke Neg Hx     Social History Social History   Tobacco Use   Smoking status: Never   Smokeless tobacco: Never  Vaping Use   Vaping status: Never Used  Substance Use Topics   Alcohol use: Not Currently    Comment: 2018   Drug use: Never   Allergies Latex, Norco [hydrocodone-acetaminophen], and Penicillins  Review of Systems Review of Systems  Constitutional:  Negative for chills and fever.  Respiratory:  Negative for chest tightness and shortness of breath.   Cardiovascular:  Positive for chest pain. Negative for leg swelling.  Musculoskeletal:  Positive for arthralgias and back pain.  Neurological:  Negative for weakness, light-headedness and numbness.  All other systems reviewed and are negative.   Physical Exam Vital Signs  I have reviewed the triage vital signs BP 113/83 (BP Location: Left Arm)   Pulse 93   Temp 97.7 F (36.5 C) (Oral)   Resp  16   Ht 4' 10.5" (1.486 m)   Wt 70.3  kg   LMP 04/17/2023 (Exact Date)   SpO2 100%   BMI 31.84 kg/m  Physical Exam Vitals and nursing note reviewed.  Constitutional:      General: She is not in acute distress.    Appearance: Normal appearance. She is well-developed. She is not ill-appearing.  HENT:     Head: Normocephalic and atraumatic.     Right Ear: External ear normal.     Left Ear: External ear normal.     Nose: Nose normal.     Mouth/Throat:     Mouth: Mucous membranes are moist.  Eyes:     General: No scleral icterus.       Right eye: No discharge.        Left eye: No discharge.  Cardiovascular:     Rate and Rhythm: Normal rate and regular rhythm.  Pulmonary:     Effort: Pulmonary effort is normal. No tachypnea or respiratory distress.     Breath sounds: No stridor.  Abdominal:     General: Abdomen is flat. There is no distension.     Tenderness: There is no guarding.  Musculoskeletal:        General: No deformity.     Cervical back: No rigidity.       Back:  Skin:    General: Skin is warm and dry.     Coloration: Skin is not cyanotic, jaundiced or pale.  Neurological:     Mental Status: She is alert and oriented to person, place, and time.     GCS: GCS eye subscore is 4. GCS verbal subscore is 5. GCS motor subscore is 6.  Psychiatric:        Speech: Speech normal.        Behavior: Behavior normal. Behavior is cooperative.     ED Results and Treatments Labs (all labs ordered are listed, but only abnormal results are displayed) Labs Reviewed  BASIC METABOLIC PANEL - Abnormal; Notable for the following components:      Result Value   CO2 19 (*)    All other components within normal limits  CBC WITH DIFFERENTIAL/PLATELET - Abnormal; Notable for the following components:   RBC 5.16 (*)    All other components within normal limits  D-DIMER, QUANTITATIVE  TROPONIN I (HIGH SENSITIVITY)  TROPONIN I (HIGH SENSITIVITY)                                                                                                                           Radiology DG Chest 2 View Result Date: 05/13/2023 CLINICAL DATA:  Chest pain worse with breathing. EXAM: CHEST - 2 VIEW COMPARISON:  02/10/2023 FINDINGS: Stable cardiomediastinal silhouette. No focal consolidation, pleural effusion, or pneumothorax. The previous left upper lobe pneumonia has resolved. No displaced rib fractures. IMPRESSION: No active cardiopulmonary disease. Electronically Signed   By: Minerva Fester M.D.   On: 05/13/2023 21:13    Pertinent labs & imaging results that were available during my care of  the patient were reviewed by me and considered in my medical decision making (see MDM for details).  Medications Ordered in ED Medications  lidocaine (LIDODERM) 5 % 1 patch (1 patch Transdermal Patch Applied 05/14/23 0225)  oxyCODONE (Oxy IR/ROXICODONE) immediate release tablet 5 mg (5 mg Oral Given 05/14/23 0224)                                                                                                                                     Procedures Procedures  (including critical care time)  Medical Decision Making / ED Course    Medical Decision Making:    LAKEIDRA RELIFORD is a 28 y.o. female with past medical history as below, significant for HIV compliant w/ antivirals, wilson's disease, HLD who presents to the ED with complaint of back pain. . The complaint involves an extensive differential diagnosis and also carries with it a high risk of complications and morbidity.  Serious etiology was considered. Ddx includes but is not limited to: Differential diagnosis includes but is not exclusive to musculoskeletal back pain, aortic aneurysm or dissection, cauda equina syndrome, sciatica, lumbar disc disease, thoracic disc disease, thoracic source etc.   Complete initial physical exam performed, notably the patient was in no distress, resting comfortably .    Reviewed and confirmed nursing  documentation for past medical history, family history, social history.  Vital signs reviewed.      Clinical Course as of 05/14/23 0421  Fri May 14, 2023  4782 Feeling better on recheck [SG]    Clinical Course User Index [SG] Sloan Leiter, DO    Brief summary: 28 year old female history of HIV here with upper back pain x 24 hours, radiation to her chest right front.  Pain provoked by bending over picking up her daughter.  Unrelieved with APAP and Flexeril.  Screening labs and imaging ordered in triage unremarkable.  Will give analgesia.   Symptoms resolved on recheck  Favor likely atypical source, MSK seems most likely  The patient's chest pain is not suggestive of pulmonary embolus, cardiac ischemia, aortic dissection, pericarditis, myocarditis, pulmonary embolism, pneumothorax, pneumonia, Zoster, or esophageal perforation, or other serious etiology.  Historically not abrupt in onset, tearing or ripping, pulses symmetric. EKG nonspecific for ischemia/infarction. No dysrhythmias, brugada, WPW, prolonged QT noted.   Troponin negative x2. CXR reviewed. Labs without demonstration of acute pathology unless otherwise noted above. Low HEART Score: 0-3 points (0.9-1.7% risk of MACE).  Given the extremely low risk of these diagnoses further testing and evaluation for these possibilities does not appear to be indicated at this time. Patient in no distress and overall condition improved here in the ED. Detailed discussions were had with the patient regarding current findings, and need for close f/u with PCP or on call doctor. The patient has been instructed to return immediately if the symptoms worsen in any way for re-evaluation. Patient verbalized understanding and is in agreement  with current care plan. All questions answered prior to discharge.               Additional history obtained: -Additional history obtained from na -External records from outside source obtained and  reviewed including: Chart review including previous notes, labs, imaging, consultation notes including  Medications, prior surgical evaluation, prior ED visits   Lab Tests: -I ordered, reviewed, and interpreted labs.   The pertinent results include:   Labs Reviewed  BASIC METABOLIC PANEL - Abnormal; Notable for the following components:      Result Value   CO2 19 (*)    All other components within normal limits  CBC WITH DIFFERENTIAL/PLATELET - Abnormal; Notable for the following components:   RBC 5.16 (*)    All other components within normal limits  D-DIMER, QUANTITATIVE  TROPONIN I (HIGH SENSITIVITY)  TROPONIN I (HIGH SENSITIVITY)    Notable for labs stable  EKG   EKG Interpretation Date/Time:  Thursday May 13 2023 20:52:34 EST Ventricular Rate:  111 PR Interval:  134 QRS Duration:  76 QT Interval:  318 QTC Calculation: 432 R Axis:   167  Text Interpretation: Sinus tachycardia Indeterminate axis Abnormal ECG When compared with ECG of 10-Feb-2023 04:06, PREVIOUS ECG IS PRESENT similar to prior no stemi Confirmed by Tanda Rockers ([id number]) on 05/14/2023 2:20:55 AM         Imaging Studies ordered: I ordered imaging studies including chest x-ray I independently visualized the following imaging with scope of interpretation limited to determining acute life threatening conditions related to emergency care; findings noted above I independently visualized and interpreted imaging. I agree with the radiologist interpretation   Medicines ordered and prescription drug management: Meds ordered this encounter  Medications   oxyCODONE (Oxy IR/ROXICODONE) immediate release tablet 5 mg    Refill:  0   lidocaine (LIDODERM) 5 % 1 patch    -I have reviewed the patients home medicines and have made adjustments as needed   Consultations Obtained: na   Cardiac Monitoring: Continuous pulse oximetry interpreted by myself, 100% on RA.    Social Determinants of Health:   Diagnosis or treatment significantly limited by social determinants of health: HIV   Reevaluation: After the interventions noted above, I reevaluated the patient and found that they have improved  Co morbidities that complicate the patient evaluation  Past Medical History:  Diagnosis Date   Anemia of pregnancy in third trimester 04/10/2022   Asthma    Dyshidrotic eczema 06/16/2021   Family history of Wilson's disease 06/23/2021   Genital herpes    Grand multiparity, antepartum 12/08/2021   HIV (human immunodeficiency virus infection) (HCC)    HIV (human immunodeficiency virus) risk factors complicating pregnancy, third trimester 12/08/2021   [ ]  get VL at [facility] visit, per ID   HIV positive (HCC) 06/16/2021   Hypolipidemia    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   Supervision of high risk pregnancy, antepartum 11/17/2021          Nursing Staff  Provider  Office Location   [place]  Dating   Early scan   [facility] Model  [x ] Traditional  [ ]  Centering  [ ]  Mom-Baby Dyad        Language   english  Anatomy US   complete  Flu Vaccine   03/02/22  Genetic/Carrier Screen   NIPS:   LR female  AFP:     Horizon:  TDaP Vaccine    04/07/22  Hgb A1C or   GTT  Early   Third trimester   COVID Vaccine  No     LAB RESULTS   Rhogam      Blood T      Dispostion: Disposition decision including need for hospitalization was considered, and patient discharged from emergency department.    Final Clinical Impression(s) / ED Diagnoses Final diagnoses:  None        Sloan Leiter, DO 05/14/23 0421

## 2023-05-14 NOTE — ED Notes (Signed)
Dr Wallace Cullens at bedside

## 2023-05-14 NOTE — Discharge Instructions (Addendum)
It was a pleasure caring for you today in the emergency department.  Please return to the emergency department for any worsening or worrisome symptoms.  No heavy lifting x7 days

## 2023-06-02 ENCOUNTER — Encounter: Payer: Self-pay | Admitting: Family Medicine

## 2023-06-30 ENCOUNTER — Encounter: Payer: Self-pay | Admitting: Obstetrics and Gynecology

## 2023-06-30 ENCOUNTER — Ambulatory Visit: Payer: Medicaid Other | Admitting: Obstetrics and Gynecology

## 2023-07-13 ENCOUNTER — Encounter: Payer: Self-pay | Admitting: Obstetrics and Gynecology

## 2023-07-13 ENCOUNTER — Ambulatory Visit (INDEPENDENT_AMBULATORY_CARE_PROVIDER_SITE_OTHER): Admitting: Obstetrics and Gynecology

## 2023-07-13 ENCOUNTER — Other Ambulatory Visit (HOSPITAL_COMMUNITY)
Admission: RE | Admit: 2023-07-13 | Discharge: 2023-07-13 | Disposition: A | Discharge status: Home / Self Care | Source: Ambulatory Visit | Attending: Obstetrics and Gynecology | Admitting: Obstetrics and Gynecology

## 2023-07-13 VITALS — BP 121/80 | HR 96 | Ht <= 58 in | Wt 155.0 lb

## 2023-07-13 DIAGNOSIS — Z01419 Encounter for gynecological examination (general) (routine) without abnormal findings: Secondary | ICD-10-CM

## 2023-07-13 NOTE — Progress Notes (Signed)
 Subjective:     Erica Walton is a 28 y.o. female P6 with LMP 06/19/23 and BMI 32 who is here for a comprehensive physical exam. The patient reports no problems. She reports a monthly period lasting 5 days. She is sexually active seeking a pregnancy. She denies pelvic pain. She reports copious vaginal discharge. She denies pruritus or odor. She reports regular bowel movement and denies urinary incontinence.   Past Medical History:  Diagnosis Date   Anemia of pregnancy in third trimester 04/10/2022   Asthma    Dyshidrotic eczema 06/16/2021   Family history of Wilson's disease 06/23/2021   Genital herpes    Grand multiparity, antepartum 12/08/2021   HIV (human immunodeficiency virus infection) (HCC)    HIV (human immunodeficiency virus) risk factors complicating pregnancy, third trimester 12/08/2021   [ ]  get VL at [facility] visit, per ID   HIV positive (HCC) 06/16/2021   Hypolipidemia    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   Supervision of high risk pregnancy, antepartum 11/17/2021          Nursing Staff  Provider  Office Location   [place]  Dating   Early scan   [facility] Model  [x ] Traditional  [ ]  Centering  [ ]  Mom-Baby Dyad        Language   english  Anatomy US   complete  Flu Vaccine   03/02/22  Genetic/Carrier Screen   NIPS:   LR female  AFP:     Horizon:  TDaP Vaccine    04/07/22  Hgb A1C or   GTT  Early   Third trimester   COVID Vaccine  No     LAB RESULTS   Rhogam      Blood T   Past Surgical History:  Procedure Laterality Date   APPENDECTOMY     LAPAROSCOPIC APPENDECTOMY N/A 07/22/2019   Procedure: APPENDECTOMY LAPAROSCOPIC;  Surgeon: Carolan Shiver, MD;  Location: [facility] ORS;  Service: General;  Laterality: N/A;   Family History  Problem Relation Age of Onset   Asthma Mother    Wilson's disease Mother    Obesity Father        history of addiction   Hypertension Father    Asthma Father    Cancer Father    Asthma Sister    Asthma Brother    Obesity Maternal Grandmother    Diabetes  Maternal Grandmother    Asthma Maternal Grandmother    Obesity Paternal Grandfather    Hypertension Paternal Grandfather    Heart disease Paternal Grandfather    Cancer Paternal Grandfather    Diabetes Maternal Uncle    Breast cancer Other    Stroke Neg Hx    Social History   Socioeconomic History   Marital status: Married    Spouse name: Marlinda Mike    Number of children: 4   Years of education: Not on file   Highest education level: Not on file  Occupational History   Not on file  Tobacco Use   Smoking status: Never   Smokeless tobacco: Never  Vaping Use   Vaping status: Never Used  Substance and Sexual Activity   Alcohol use: Not Currently    Comment: 2018   Drug use: Never   Sexual activity: Yes    Birth control/protection: None    Comment: DECLINED CONDOMS  Other Topics Concern   Not on file  Social History Narrative   Not on file   Social Drivers of Health   Financial Resource Strain:  Low Risk  (06/18/2022)   Overall Financial Resource Strain (CARDIA)    Difficulty of Paying Living Expenses: Not hard at all  Food Insecurity: No Food Insecurity (06/18/2022)   Hunger Vital Sign    Worried About Running Out of Food in the Last Year: Never true    Ran Out of Food in the Last Year: Never true  Transportation Needs: No Transportation Needs (06/18/2022)   PRAPARE - Administrator, Civil Service (Medical): No    Lack of Transportation (Non-Medical): No  Physical Activity: Not on file  Stress: No Stress Concern Present (10/15/2022)   [employer] of Occupational Health - Occupational Stress Questionnaire    Feeling of Stress : Only a little  Social Connections: Not on file  Intimate Partner Violence: Not At Risk (06/18/2022)   Humiliation, Afraid, Rape, and Kick questionnaire    Fear of Current or Ex-Partner: No    Emotionally Abused: No    Physically Abused: No    Sexually Abused: No   Health Maintenance  Topic Date Due   COVID-19 Vaccine (1)  Never done   HPV VACCINES (3 - Risk 3-dose series) 09/26/2010   Pneumococcal Vaccine 7-35 Years old (2 of 2 - PPSV23 or PCV20) 05/22/2021   INFLUENZA VACCINE  11/19/2022   Cervical Cancer Screening (Pap smear)  05/17/2023   DTaP/Tdap/Td (12 - Td or Tdap) 04/07/2032   Hepatitis C Screening  Completed   HIV Screening  Completed       Review of Systems Pertinent items noted in HPI and remainder of comprehensive ROS otherwise negative.   Objective:  Blood pressure 121/80, pulse 96, height 4\' 10"  (1.473 m), weight 155 lb (70.3 kg), last menstrual period 06/19/2023, not currently breastfeeding.   GENERAL: Well-developed, well-nourished female in no acute distress.  HEENT: Normocephalic, atraumatic. Sclerae anicteric.  NECK: Supple. Normal thyroid.  LUNGS: Clear to auscultation bilaterally.  HEART: Regular rate and rhythm. BREASTS: Symmetric in size. No palpable masses or lymphadenopathy, skin changes, or nipple drainage. ABDOMEN: Soft, nontender, nondistended. No organomegaly. PELVIC: Normal external female genitalia. Vagina is pink and rugated.  Normal discharge. Normal appearing cervix. Uterus is normal in size. No adnexal mass or tenderness. Chaperone present during the pelvic exam EXTREMITIES: No cyanosis, clubbing, or edema, 2+ distal pulses.     Assessment:    Healthy female exam.      Plan:    Pap smear collected Vaginal swab collected per patient request Patient advised to take prenatal vitamins See After Visit Summary for Counseling Recommendations

## 2023-07-13 NOTE — Progress Notes (Signed)
 Patient presents for Annual.  LMP: 06/19/23 Last pap:  05/16/20 Contraception: None Mammogram: Not yet indicated STD Screening: Declines   CC:  vaginal discharge  Pt wants to conceive at least 1 more child.pt states too early for UPT.

## 2023-07-14 LAB — CERVICOVAGINAL ANCILLARY ONLY
Bacterial Vaginitis (gardnerella): NEGATIVE
Candida Glabrata: NEGATIVE
Candida Vaginitis: NEGATIVE
Comment: NEGATIVE
Comment: NEGATIVE
Comment: NEGATIVE

## 2023-07-15 LAB — CYTOLOGY - PAP
Comment: NEGATIVE
Diagnosis: NEGATIVE
High risk HPV: NEGATIVE

## 2023-07-20 ENCOUNTER — Encounter: Payer: Self-pay | Admitting: Obstetrics and Gynecology

## 2023-07-27 ENCOUNTER — Encounter: Payer: Self-pay | Admitting: Infectious Diseases

## 2023-07-27 ENCOUNTER — Other Ambulatory Visit: Payer: Self-pay

## 2023-07-27 ENCOUNTER — Ambulatory Visit: Admitting: Infectious Diseases

## 2023-07-27 VITALS — BP 104/72 | HR 100 | Resp 16 | Ht 59.0 in | Wt 156.4 lb

## 2023-07-27 DIAGNOSIS — B20 Human immunodeficiency virus [HIV] disease: Secondary | ICD-10-CM

## 2023-07-27 DIAGNOSIS — Z3A01 Less than 8 weeks gestation of pregnancy: Secondary | ICD-10-CM

## 2023-07-27 MED ORDER — BIKTARVY 50-200-25 MG PO TABS: 1.0000 | ORAL_TABLET | Freq: Every day | ORAL | 11 refills | Status: AC

## 2023-07-27 NOTE — Assessment & Plan Note (Addendum)
 Early first trimester pregnancy - reported with home test. Will get blood work today for confirmation. First OB appt is in May. Will change regimen to Biktarvy.

## 2023-07-27 NOTE — Assessment & Plan Note (Addendum)
 Update labs today - previous labs from 2024 show viral suppression and a healthy CD4 count. Will change her regimen from Tivicay/Descovy to [facility] for single tablet regimen.   Plan to follow up in 4 months with labs.

## 2023-07-27 NOTE — Progress Notes (Signed)
 I saw Erica Walton with Erica Walton, SNP and agree with the assessment and plan.   Discussed the use of AI scribe software for clinical note transcription with the patient, who gave verbal consent to proceed.  History of Present Illness   Erica Walton is a 28 year old female with HIV who presents for prenatal care and medication management. She is accompanied by Erica Walton, a family member.  She is currently managing her HIV with Tivicay and Descovy, which she has tolerated well. Her viral loads have been well-controlled, with only one instance of elevated levels in the past due to a lapse in Medicaid coverage.  She is approximately five weeks pregnant, with her last menstrual period on March 1st. Multiple home pregnancy tests have been positive, and she is scheduled for an ultrasound on May 6th to confirm the gestational age. She has a history of successful pregnancies and deliveries, with her youngest child being one year old. She is planning to breastfeed her upcoming child, as she has done with her previous children.  She reports no current depressive episodes and did not experience postpartum depression with her last child. She has a supportive network, including her husband and a friend who regularly check in on her mental health. She has a history of postpartum depression with a previous child but has been able to manage her mood well since then.  She is pursuing a Conservation officer, historic buildings, with plans to eventually become an LPN. 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        HIV 1 RNA Quant (Copies/mL)  Date Value  06/25/2021 Not Detected  01/27/2021 <20 (H)  11/25/2020 Not Detected   HIV-1 RNA Viral Load (copies/mL)  Date Value  05/27/2022 <20  01/08/2022 <20  03/06/2020 <20   CD4 T Cell Abs (/uL)  Date Value  01/27/2021 990  09/23/2020 857  05/02/2020 948   Assessment and Plan    HIV in pregnancy She is five weeks  pregnant with well-controlled viral loads. Considering switch to [facility] for convenience and efficacy in pregnancy. Viral suppression is crucial for reducing perinatal transmission risk. Eligible for breastfeeding which she would like to try again.  - Switch to [facility] after current regimen completion. - Order CD4 count and viral load tests at mid-pregnancy and 36 weeks. - Perform RPR screening. - Order beta HCG quantitative test to confirm pregnancy. - Advise separating prenatal vitamins and Biktarvy by six hours.  Prenatal care She requires routine prenatal care and plans to breastfeed. 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 Advised on avoiding unnecessary radiation during pregnancy. - Provide letter to avoid chest x-ray due to pregnancy if this is needed.  - Order QuantiFERON TB test (last negative 4 years ago)  - Advise starting prenatal vitamins. Separate from biktarvy > 6 hours  - b HCG quant today to verify pregnancy  - Schedule OB/GYN appointments for May 6th and May 28th.       Erica Alberts, MSN, NP-C [facility]  [email or web address] Pager: [phone] Office: [phone] [facility] Main Line: [phone] *Secure Chat Communication Welcome

## 2023-07-27 NOTE — Patient Instructions (Signed)
 Will change your tivicay and descovy to once daily Biktarvy   Just keep the tivicay and descovy going together everyday and then your new bottle you can switch.

## 2023-07-27 NOTE — Progress Notes (Signed)
 Name: Erica Walton  DOB: [birth year] MRN: [id number] PCP: Alyson Reedy, FNP    Brief Narrative:  Erica Walton is a 28 y.o. female with well controlled HIV, Dx 2013 with routine pregnancy testing. Has always been on treatment and previously in care through [facility].  CD4 nadir 300  HIV Risk: heterosexual  History of OIs: none known  Intake Labs 06/2019: Hep B sAg (-), sAb (+), cAb (-); Hep A (-), Hep C (-) Quantiferon (-) HLA B*5701 (-) G6PD: ()   Previous Regimens: Truvada + Prezista/Ritonovir Stribild Genvoya --> undetectable  Tivicay + Truvada during pregnancy 2021 - 2022 Cabenuva  Tivicay + Truvada 03/2021 with desire of pregnancy    Genotypes: 2013 - [id number]       Subjective:   No chief complaint on file.      HPI:  Erica Walton presents today for HIV follow up. Recent positive home pregnancy test. This is # 7 - she has all girls right now. She's extremely excited for her new addition and said that her daughters are extremely helpful with the younger ones. Her youngest is 1 - she has been on the same HIV medication (Tivicay and Descovy) from previous pregnancy. Denies any difficulty obtaining or taking her meds. She states that she does have occasional morning sickness and fatigue. 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 Requesting some of her labs be updated. Her first OB appointment is in May.    .     Review of Systems  Constitutional:  Positive for malaise/fatigue. Negative for chills and fever.       Reports fatigue related to first trimester pregnancy, reports morning sickness,   HENT:  Negative for tinnitus.   Eyes:  Negative for blurred vision and photophobia.  Respiratory:  Negative for cough and sputum production.   Cardiovascular:  Negative for chest pain.  Gastrointestinal:  Positive for nausea and vomiting. Negative for diarrhea.  Genitourinary:  Positive for frequency. Negative for dysuria, flank pain and  hematuria.  Skin:  Negative for rash.  Neurological:  Negative for headaches.  Psychiatric/Behavioral: Negative.       Past Medical History:  Diagnosis Date   Anemia of pregnancy in third trimester 04/10/2022   Asthma    Dyshidrotic eczema 06/16/2021   Family history of Wilson's disease 06/23/2021   Genital herpes    Grand multiparity, antepartum 12/08/2021   HIV (human immunodeficiency virus infection) (HCC)    HIV (human immunodeficiency virus) risk factors complicating pregnancy, third trimester 12/08/2021   [ ]  get VL at [facility] visit, per ID   HIV positive (HCC) 06/16/2021   Hypolipidemia    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   Supervision of high risk pregnancy, antepartum 11/17/2021          Nursing Staff  Provider  Office Location   [place]  Dating   Early scan   [facility] Model  [x ] Traditional  [ ]  Centering  [ ]  Mom-Baby Dyad        Language   english  Anatomy US   complete  Flu Vaccine   03/02/22  Genetic/Carrier Screen   NIPS:   LR female  AFP:     Horizon:  TDaP Vaccine    04/07/22  Hgb A1C or   GTT  Early   Third trimester   COVID Vaccine  No     LAB RESULTS   Rhogam      Blood T    Outpatient Medications  Prior to Visit  Medication Sig Dispense Refill   clotrimazole-betamethasone (LOTRISONE) cream Apply 1 Application topically 2 (two) times daily. Use twice daily for two weeks. 30 g 0   DESCOVY 200-25 MG tablet TAKE 1 TABLET BY MOUTH EVERY DAY IN THE EVENING 30 tablet 3   TIVICAY 50 MG tablet TAKE 1 TABLET BY MOUTH EVERY DAY IN THE EVENING 30 tablet 3   acetaminophen (TYLENOL) 325 MG tablet Take 2 tablets (650 mg total) by mouth every 6 (six) hours as needed. 36 tablet 0   atorvastatin (LIPITOR) 20 MG tablet Take 1 tablet (20 mg total) by mouth daily. (Patient not taking: Reported on 07/27/2023) 30 tablet 2   budesonide-formoterol (SYMBICORT) 80-4.5 MCG/ACT inhaler Inhale 2 puffs into the lungs 2 (two) times daily. (Patient not taking: Reported on 07/27/2023) 1 each 3   Cholecalciferol  (VITAMIN D3) 25 MCG (1000 UT) CAPS Take 1 capsule (1,000 Units total) by mouth daily. (Patient not taking: Reported on 11/10/2022) 30 capsule 2   cyclobenzaprine (FLEXERIL) 5 MG tablet Take 1 tablet (5 mg total) by mouth 3 (three) times daily as needed for muscle spasms. (Patient not taking: Reported on 07/13/2023) 30 tablet 1   etonogestrel-ethinyl estradiol (NUVARING) 0.12-0.015 MG/24HR vaginal ring Insert vaginally and leave in place for 3 consecutive weeks, then remove for 1 week. (Patient not taking: Reported on 07/13/2023) 1 each 12   hydrocortisone valerate cream (WESTCORT) 0.2 % Apply 1 Application topically 2 (two) times daily. (Patient not taking: Reported on 07/27/2023) 45 g 0   lidocaine (LIDODERM) 5 % Place 1 patch onto the skin daily as needed. Remove & Discard patch within 12 hours or as directed by MD (Patient not taking: Reported on 07/27/2023) 15 patch 0   ofloxacin (FLOXIN) 0.3 % OTIC solution Place 5 drops into the left ear 2 (two) times daily. (Patient not taking: Reported on 07/27/2023) 5 mL 0   oxyCODONE (ROXICODONE) 5 MG immediate release tablet Take 1 tablet (5 mg total) by mouth every 6 (six) hours as needed for severe pain (pain score 7-10). (Patient not taking: Reported on 07/27/2023) 5 tablet 0   ciprofloxacin (CIPRO) 500 MG tablet Take 1 tablet (500 mg total) by mouth every 12 (twelve) hours. (Patient not taking: Reported on 07/27/2023) 10 tablet 0   No facility-administered medications prior to visit.     Allergies  Allergen Reactions   Latex Rash   Norco [Hydrocodone-Acetaminophen] Rash   Penicillins Rash and Other (See Comments)    Has patient had a PCN reaction causing immediate rash, facial/tongue/throat swelling, SOB or lightheadedness with hypotension: yes, VHQI:[id number]} Has patient had a PCN reaction causing severe rash involving mucus membranes or skin necrosis: no:[id number]} Has patient had a PCN reaction that required hospitalization no:[id number]} Has patient had a PCN  reaction occurring within the last 10 years: no:[id number]} If all of the above answers are "NO", then may proceed with Cephalosporin use.     Social History   Tobacco Use   Smoking status: Never   Smokeless tobacco: Never  Vaping Use   Vaping status: Never Used  Substance Use Topics   Alcohol use: Not Currently    Comment: 2018   Drug use: Never    Social History   Substance and Sexual Activity  Sexual Activity Yes   Birth control/protection: None   Comment: DECLINED CONDOMS     Objective:   Vitals:   07/27/23 1348 07/27/23 1351  BP:  104/72  Pulse:  100  Resp: 16  SpO2:  100%  Weight: 156 lb 6.4 oz (70.9 kg)   Height: 4\' 11"  (1.499 m)     Body mass index is 31.59 kg/m.    Physical Exam Constitutional:      Appearance: Normal appearance. She is not ill-appearing.  HENT:     Mouth/Throat:     Mouth: Mucous membranes are moist.     Pharynx: Oropharynx is clear.  Eyes:     General: No scleral icterus. Cardiovascular:     Rate and Rhythm: Normal rate and regular rhythm.  Pulmonary:     Effort: Pulmonary effort is normal.  Neurological:     Mental Status: She is oriented to person, place, and time.  Psychiatric:        Mood and Affect: Mood normal.        Thought Content: Thought content normal.     Lab Results Lab Results  Component Value Date   WBC 8.1 05/13/2023   HGB 14.5 05/13/2023   HCT 44.0 05/13/2023   MCV 85.3 05/13/2023   PLT 379 05/13/2023    Lab Results  Component Value Date   CREATININE 0.71 05/13/2023   BUN 7 05/13/2023   NA 138 05/13/2023   K 4.0 05/13/2023   CL 109 05/13/2023   CO2 19 (L) 05/13/2023    Lab Results  Component Value Date   ALT 11 06/16/2021   AST 18 06/16/2021   ALKPHOS 125 (H) 06/16/2021   BILITOT 0.3 06/16/2021    Lab Results  Component Value Date   CHOL 209 (H) 10/05/2022   HDL 51 10/05/2022   LDLCALC 115 (H) 10/05/2022   TRIG 246 (H) 10/05/2022   CHOLHDL 4.1 10/05/2022   HIV 1 RNA Quant  (Copies/mL)  Date Value  06/25/2021 Not Detected  01/27/2021 <20 (H)  11/25/2020 Not Detected   HIV-1 RNA Viral Load (copies/mL)  Date Value  05/27/2022 <20  01/08/2022 <20  03/06/2020 <20   CD4 T Cell Abs (/uL)  Date Value  01/27/2021 990  09/23/2020 857  05/02/2020 948     Assessment & Plan:   Patient Active Problem List   Diagnosis Date Noted   Acute otitis media with effusion of left ear 10/28/2022   Chronic bilateral low back pain without sciatica 10/05/2022   Pregnancy 11/17/2021   Mixed hyperlipidemia 06/23/2021   Herpes 10/06/2013   Asthma 11/17/2011   Human immunodeficiency virus (HIV) disease (HCC) 11/17/2011    Problem List Items Addressed This Visit       Other   Human immunodeficiency virus (HIV) disease (HCC) - Primary   Update labs today - previous labs from 2024 show viral suppression and a healthy CD4 count. Will change her regimen from Tivicay/Descovy to [facility] for single tablet regimen.       Pregnancy   Early first trimester pregnancy - reported with home test. Will get blood work today for confirmation. First OB appt is in May. Will change regimen to Biktarvy.        Geronimo Boot, RN, MPH Family Nurse Practitioner Student [employer] [facility]

## 2023-07-28 LAB — T-HELPER CELLS (CD4) COUNT (NOT AT ARMC)
CD4 % Helper T Cell: 42 % (ref 33–65)
CD4 T Cell Abs: 784 /uL (ref 400–1790)

## 2023-07-30 LAB — QUANTIFERON-TB GOLD PLUS
Mitogen-NIL: 8.27 [IU]/mL
NIL: 0.02 [IU]/mL
QuantiFERON-TB Gold Plus: NEGATIVE
TB1-NIL: 0 [IU]/mL
TB2-NIL: 0 [IU]/mL

## 2023-07-30 LAB — LIPID PANEL
Cholesterol: 161 mg/dL (ref <200)
HDL: 44 mg/dL — ABNORMAL LOW (ref >=50)
LDL Cholesterol (Calc): 85 mg/dL
Non-HDL Cholesterol (Calc): 117 mg/dL (ref <130)
Total CHOL/HDL Ratio: 3.7 (calc) (ref <5.0)
Triglycerides: 231 mg/dL — ABNORMAL HIGH (ref <150)

## 2023-07-30 LAB — HIV-1 RNA QUANT-NO REFLEX-BLD
HIV 1 RNA Quant: NOT DETECTED {copies}/mL
HIV-1 RNA Quant, Log: NOT DETECTED {Log_copies}/mL

## 2023-07-30 LAB — HCG, QUANTITATIVE, PREGNANCY: HCG, Total, QN: 5219 m[IU]/mL

## 2023-08-02 ENCOUNTER — Other Ambulatory Visit: Payer: Self-pay | Admitting: *Deleted

## 2023-08-02 MED ORDER — VITAFOL GUMMIES 3.33-0.333-34.8 MG PO CHEW: 1.0000 | CHEWABLE_TABLET | Freq: Every day | ORAL | 5 refills | Status: AC

## 2023-08-24 ENCOUNTER — Other Ambulatory Visit (INDEPENDENT_AMBULATORY_CARE_PROVIDER_SITE_OTHER): Payer: Self-pay

## 2023-08-24 ENCOUNTER — Other Ambulatory Visit: Payer: Self-pay | Admitting: *Deleted

## 2023-08-24 ENCOUNTER — Ambulatory Visit: Admitting: *Deleted

## 2023-08-24 VITALS — BP 116/77 | HR 97

## 2023-08-24 DIAGNOSIS — Z3481 Encounter for supervision of other normal pregnancy, first trimester: Secondary | ICD-10-CM

## 2023-08-24 DIAGNOSIS — Z3A09 9 weeks gestation of pregnancy: Secondary | ICD-10-CM | POA: Diagnosis not present

## 2023-08-24 DIAGNOSIS — O3680X0 Pregnancy with inconclusive fetal viability, not applicable or unspecified: Secondary | ICD-10-CM

## 2023-08-24 DIAGNOSIS — B20 Human immunodeficiency virus [HIV] disease: Secondary | ICD-10-CM

## 2023-08-24 DIAGNOSIS — O0990 Supervision of high risk pregnancy, unspecified, unspecified trimester: Secondary | ICD-10-CM | POA: Insufficient documentation

## 2023-08-24 NOTE — Progress Notes (Signed)
 New OB Intake  I explained I am completing New OB Intake today. We discussed EDD of 03/25/2024, by Last Menstrual Period. Pt is G7P6006. I reviewed her allergies, medications and Medical/Surgical/OB history.    Patient Active Problem List   Diagnosis Date Noted   Supervision of high risk pregnancy, antepartum 08/24/2023   Chronic bilateral low back pain without sciatica 10/05/2022   Mixed hyperlipidemia 06/23/2021   Herpes 10/06/2013   Asthma 11/17/2011   Human immunodeficiency virus (HIV) disease (HCC) 11/17/2011    Concerns addressed today  Patient informed that the ultrasound is considered a limited obstetric ultrasound and is not intended to be a complete ultrasound exam.  Patient also informed that the ultrasound is not being completed with the intent of assessing for fetal or placental anomalies or any pelvic abnormalities. Explained that the purpose of today's ultrasound is to assess for viability.  Patient acknowledges the purpose of the exam and the limitations of the study.     Delivery Plans Plans to deliver at [facility]. Discussed the nature of our practice with multiple providers including residents and students. Due to the size of the practice, the delivering provider may not be the same as those providing prenatal care.   MyChart/Babyscripts MyChart access verified. I explained pt will have some visits in office and some virtually. Babyscripts app discussed and ordered.   Blood Pressure Cuff Blood pressure cuff discussed and given Discussed to be used for virtual visits and or if needed BP checks weekly.  Anatomy US  Explained first scheduled US  will be around 19 weeks.   Last Pap Diagnosis  Date Value Ref Range Status  07/13/2023   Final   - Negative for intraepithelial lesion or malignancy (NILM)    First visit review I reviewed new OB appt with patient. Explained pt will be seen by Dr Adriana Hopping at first visit. Discussed [facility] [place] genetic screening with patient will get  panorama at new OB. Routine prenatal labs to be collected at new OB.    Bettey Browning, RN 08/24/2023  1:34 PM

## 2023-08-31 ENCOUNTER — Encounter: Payer: Self-pay | Admitting: Family Medicine

## 2023-09-15 ENCOUNTER — Encounter: Payer: Self-pay | Admitting: Family Medicine

## 2023-09-15 ENCOUNTER — Other Ambulatory Visit (HOSPITAL_COMMUNITY)
Admission: RE | Admit: 2023-09-15 | Discharge: 2023-09-15 | Disposition: A | Discharge status: Home / Self Care | Source: Ambulatory Visit | Attending: Family Medicine | Admitting: Family Medicine

## 2023-09-15 ENCOUNTER — Ambulatory Visit: Admitting: Family Medicine

## 2023-09-15 VITALS — BP 117/81 | HR 91 | Wt 158.0 lb

## 2023-09-15 DIAGNOSIS — Z3A12 12 weeks gestation of pregnancy: Secondary | ICD-10-CM | POA: Diagnosis not present

## 2023-09-15 DIAGNOSIS — O0990 Supervision of high risk pregnancy, unspecified, unspecified trimester: Secondary | ICD-10-CM | POA: Diagnosis not present

## 2023-09-15 DIAGNOSIS — J4541 Moderate persistent asthma with (acute) exacerbation: Secondary | ICD-10-CM

## 2023-09-15 DIAGNOSIS — B009 Herpesviral infection, unspecified: Secondary | ICD-10-CM

## 2023-09-15 DIAGNOSIS — O98711 Human immunodeficiency virus [HIV] disease complicating pregnancy, first trimester: Secondary | ICD-10-CM | POA: Diagnosis not present

## 2023-09-15 DIAGNOSIS — Z131 Encounter for screening for diabetes mellitus: Secondary | ICD-10-CM | POA: Diagnosis not present

## 2023-09-15 DIAGNOSIS — O98311 Other infections with a predominantly sexual mode of transmission complicating pregnancy, first trimester: Secondary | ICD-10-CM | POA: Diagnosis not present

## 2023-09-15 DIAGNOSIS — Z1331 Encounter for screening for depression: Secondary | ICD-10-CM | POA: Diagnosis not present

## 2023-09-15 DIAGNOSIS — B20 Human immunodeficiency virus [HIV] disease: Secondary | ICD-10-CM

## 2023-09-15 NOTE — Progress Notes (Signed)
 NOB   Unable to hear FHT's got assistance from RN.  Last pap:07/13/23 WNL  Genetic Screening: Yes   CC: Congestion and HA

## 2023-09-15 NOTE — Progress Notes (Signed)
 Subjective:   Erica Walton is a 28 y.o. G7P6006 at [redacted]w[redacted]d by LMP, early ultrasound being seen today for her first obstetrical visit.  Her obstetrical history is significant for HIV. Patient does not intend to breast feed. Pregnancy history fully reviewed.  Patient reports no complaints.  HISTORY: OB History  Gravida Para Term Preterm AB Living  7 6 6  0 0 6  SAB IAB Ectopic Multiple Live Births  0 0 0 0 6    # Outcome Date GA Lbr Len/2nd Weight Sex Type Anes PTL Lv  7 Current           6 Term 06/16/22 [redacted]w[redacted]d 11:32 / 00:07 6 lb 14.4 oz (3.13 kg) F Vag-Spont EPI  LIV     Name: Tauer,GIRL Aymar     Apgar1: 8  Apgar5: 9  5 Term 04/06/20 [redacted]w[redacted]d 02:45 / 00:04 7 lb 9.3 oz (3.439 kg) F Vag-Spont EPI  LIV     Name: THERESA, WEDEL     Apgar1: 9  Apgar5: 9  4 Term 11/05/18    F Vag-Spont   LIV  3 Term 12/06/17    F Vag-Spont   LIV  2 Term 05/31/13    F Vag-Spont   LIV  1 Term 04/04/12    F Vag-Spont   LIV   Last pap smear was  06/2023 and was normal Past Medical History:  Diagnosis Date   Anemia of pregnancy in third trimester 04/10/2022   Asthma    Dyshidrotic eczema 06/16/2021   Family history of Wilson's disease 06/23/2021   Genital herpes    Grand multiparity, antepartum 12/08/2021   HIV (human immunodeficiency virus infection) (HCC)    HIV (human immunodeficiency virus) risk factors complicating pregnancy, third trimester 12/08/2021   [ ]  get VL at [facility] visit, per ID   HIV positive (HCC) 06/16/2021   Hypolipidemia    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   Supervision of high risk pregnancy, antepartum 11/17/2021          Nursing Staff  Provider  Office Location   [place]  Dating   Early scan   [facility] Model  [x ] Traditional  [ ]  Centering  [ ]  Mom-Baby Dyad        Language   english  Anatomy US    complete  Flu Vaccine   03/02/22  Genetic/Carrier Screen   NIPS:   LR female  AFP:     Horizon:  TDaP Vaccine    04/07/22  Hgb A1C or   GTT  Early   Third trimester   COVID Vaccine   No     LAB RESULTS   Rhogam      Blood T   Past Surgical History:  Procedure Laterality Date   APPENDECTOMY     LAPAROSCOPIC APPENDECTOMY N/A 07/22/2019   Procedure: APPENDECTOMY LAPAROSCOPIC;  Surgeon: Eldred Grego, MD;  Location: [facility] ORS;  Service: General;  Laterality: N/A;   Family History  Problem Relation Age of Onset   Asthma Mother    Wilson's disease Mother    Obesity Father        history of addiction   Hypertension Father    Asthma Father    Cancer Father    Leukemia Father        CML   Asthma Sister    Asthma Brother    Diabetes Maternal Uncle    Obesity Maternal Grandmother    Diabetes Maternal Grandmother    Asthma Maternal Grandmother  Obesity Paternal Grandfather    Hypertension Paternal Grandfather    Heart disease Paternal Grandfather    Cancer Paternal Grandfather    Breast cancer Other    Uterine cancer Maternal Great-grandmother    Stroke Neg Hx    Social History   Tobacco Use   Smoking status: Never   Smokeless tobacco: Never  Vaping Use   Vaping status: Never Used  Substance Use Topics   Alcohol use: Not Currently    Comment: 2018   Drug use: Never   Allergies  Allergen Reactions   Latex Rash   Norco [Hydrocodone -Acetaminophen ] Rash   Penicillins Rash and Other (See Comments)    Has patient had a PCN reaction causing immediate rash, facial/tongue/throat swelling, SOB or lightheadedness with hypotension: yes, [id number]} Has patient had a PCN reaction causing severe rash involving mucus membranes or skin necrosis: no:[id number]} Has patient had a PCN reaction that required hospitalization no:[id number]} Has patient had a PCN reaction occurring within the last 10 years: no:[id number]} If all of the above answers are "NO", then may proceed with Cephalosporin use.    Current Outpatient Medications on File Prior to Visit  Medication Sig Dispense Refill   acetaminophen  (TYLENOL ) 325 MG tablet Take 2 tablets (650 mg total) by mouth  every 6 (six) hours as needed. 36 tablet 0   bictegravir-emtricitabine -tenofovir  AF (BIKTARVY ) 50-200-25 MG TABS tablet Take 1 tablet by mouth daily. 30 tablet 11   budesonide -formoterol  (SYMBICORT ) 80-4.5 MCG/ACT inhaler Inhale 2 puffs into the lungs 2 (two) times daily. 1 each 3   Prenatal Vit-Fe Phos-FA-Omega (VITAFOL  GUMMIES) 3.33-0.333-34.8 MG CHEW Chew 1 tablet by mouth daily. 90 tablet 5   No current facility-administered medications on file prior to visit.     Exam   Vitals:   09/15/23 1013  BP: 117/81  Pulse: 91  Weight: 158 lb (71.7 kg)   Fetal Heart Rate (bpm): 167  System: General: well-developed, well-nourished female in no acute distress   Skin: normal coloration and turgor, no rashes   Neurologic: oriented, normal, negative, normal mood   Extremities: normal strength, tone, and muscle mass, ROM of all joints is normal   HEENT PERRLA, extraocular movement intact and sclera clear, anicteric   Mouth/Teeth mucous membranes moist, pharynx normal without lesions and dental hygiene good   Neck supple and no masses   Cardiovascular: regular rate and rhythm   Respiratory:  no respiratory distress, normal breath sounds   Abdomen: soft, non-tender; bowel sounds normal; no masses,  no organomegaly     Assessment:   Pregnancy: [id number] Patient Active Problem List   Diagnosis Date Noted   Supervision of high risk pregnancy, antepartum 08/24/2023   Chronic bilateral low back pain without sciatica 10/05/2022   Mixed hyperlipidemia 06/23/2021   Herpes 10/06/2013   Asthma 11/17/2011   Human immunodeficiency virus (HIV) disease (HCC) 11/17/2011     Plan:  1. Supervision of high risk pregnancy, antepartum (Primary) New OB labs - CBC/D/Plt+RPR+Rh+ABO+RubIgG... - Culture, OB Urine - Cervicovaginal ancillary only - PANORAMA PRENATAL TEST - Hemoglobin A1c  2. Human immunodeficiency virus (HIV) disease (HCC) On Biktarvy , last VL was undetectable  3. Herpes Will need ppx  later in pregnancy  4. [redacted] weeks gestation of pregnancy   5. Moderate persistent asthma with acute exacerbation On Symbicort . Not needing rescue.   Initial labs drawn. Continue prenatal vitamins. Genetic Screening discussed, NIPS: ordered. Ultrasound discussed; fetal anatomic survey: ordered. Problem list reviewed and updated.  Routine obstetric precautions reviewed. Return in 4  weeks (on 10/13/2023).

## 2023-09-16 LAB — CBC/D/PLT+RPR+RH+ABO+RUBIGG...
Antibody Screen: NEGATIVE
Basophils Absolute: 0 10*3/uL (ref 0.0–0.2)
Basos: 0 %
EOS (ABSOLUTE): 0.3 10*3/uL (ref 0.0–0.4)
Eos: 3 %
HCV Ab: NONREACTIVE
HIV Screen 4th Generation wRfx: REACTIVE
Hematocrit: 39.2 % (ref 34.0–46.6)
Hemoglobin: 12.6 g/dL (ref 11.1–15.9)
Hepatitis B Surface Ag: NEGATIVE
Immature Grans (Abs): 0 10*3/uL (ref 0.0–0.1)
Immature Granulocytes: 0 %
Lymphocytes Absolute: 1.9 10*3/uL (ref 0.7–3.1)
Lymphs: 21 %
MCH: 28.3 pg (ref 26.6–33.0)
MCHC: 32.1 g/dL (ref 31.5–35.7)
MCV: 88 fL (ref 79–97)
Monocytes Absolute: 0.4 10*3/uL (ref 0.1–0.9)
Monocytes: 5 %
Neutrophils Absolute: 6.6 10*3/uL (ref 1.4–7.0)
Neutrophils: 71 %
Platelets: 311 10*3/uL (ref 150–450)
RBC: 4.46 x10E6/uL (ref 3.77–5.28)
RDW: 13.1 % (ref 11.7–15.4)
RPR Ser Ql: NONREACTIVE
Rh Factor: POSITIVE
Rubella Antibodies, IGG: 1.84 {index} (ref >0.99)
WBC: 9.2 10*3/uL (ref 3.4–10.8)

## 2023-09-16 LAB — CERVICOVAGINAL ANCILLARY ONLY
Chlamydia: NEGATIVE
Comment: NEGATIVE
Comment: NORMAL
Neisseria Gonorrhea: NEGATIVE

## 2023-09-16 LAB — HIV 1/2 AB DIFFERENTIATION
HIV 1 Ab: REACTIVE
HIV 2 Ab: NONREACTIVE
NOTE (HIV CONF MULTIP: POSITIVE — AB

## 2023-09-16 LAB — HEMOGLOBIN A1C
Est. average glucose Bld gHb Est-mCnc: 94 mg/dL
Hgb A1c MFr Bld: 4.9 % (ref 4.8–5.6)

## 2023-09-16 LAB — HCV INTERPRETATION

## 2023-09-17 ENCOUNTER — Ambulatory Visit: Payer: Self-pay | Admitting: Family Medicine

## 2023-09-17 DIAGNOSIS — O0990 Supervision of high risk pregnancy, unspecified, unspecified trimester: Secondary | ICD-10-CM

## 2023-09-17 DIAGNOSIS — R8271 Bacteriuria: Secondary | ICD-10-CM

## 2023-09-18 LAB — CULTURE, OB URINE

## 2023-09-18 LAB — URINE CULTURE, OB REFLEX

## 2023-09-20 DIAGNOSIS — R8271 Bacteriuria: Secondary | ICD-10-CM | POA: Insufficient documentation

## 2023-09-20 LAB — PANORAMA PRENATAL TEST FULL PANEL:PANORAMA TEST PLUS 5 ADDITIONAL MICRODELETIONS: FETAL FRACTION: 7.5

## 2023-09-20 MED ORDER — NITROFURANTOIN MONOHYD MACRO 100 MG PO CAPS
100.0000 mg | ORAL_CAPSULE | Freq: Two times a day (BID) | ORAL | 0 refills | Status: DC
Start: 2023-09-20 — End: 2023-10-14

## 2023-09-20 NOTE — Addendum Note (Signed)
 Addended by: Granville Layer on: 09/20/2023 10:45 AM   Modules accepted: Orders

## 2023-10-04 ENCOUNTER — Encounter: Payer: Self-pay | Admitting: Family Medicine

## 2023-10-13 ENCOUNTER — Ambulatory Visit (INDEPENDENT_AMBULATORY_CARE_PROVIDER_SITE_OTHER): Admitting: Certified Nurse Midwife

## 2023-10-13 VITALS — BP 96/66 | HR 81 | Wt 159.0 lb

## 2023-10-13 DIAGNOSIS — R399 Unspecified symptoms and signs involving the genitourinary system: Secondary | ICD-10-CM

## 2023-10-13 DIAGNOSIS — Z3A16 16 weeks gestation of pregnancy: Secondary | ICD-10-CM | POA: Diagnosis not present

## 2023-10-13 DIAGNOSIS — Z3492 Encounter for supervision of normal pregnancy, unspecified, second trimester: Secondary | ICD-10-CM | POA: Diagnosis not present

## 2023-10-13 LAB — POCT URINALYSIS DIPSTICK

## 2023-10-13 MED ORDER — CEPHALEXIN 500 MG PO CAPS: 500.0000 mg | ORAL_CAPSULE | Freq: Four times a day (QID) | ORAL | 0 refills | Status: DC

## 2023-10-13 MED ORDER — FOSFOMYCIN TROMETHAMINE 3 G PO PACK: 3.0000 g | PACK | Freq: Once | ORAL | Status: DC

## 2023-10-13 NOTE — Progress Notes (Unsigned)
 ROB   CC: possible UTI.

## 2023-10-14 NOTE — Progress Notes (Signed)
   PRENATAL VISIT NOTE  Subjective:  Erica Walton is a 28 y.o. G7P6006 at [redacted]w[redacted]d being seen today for ongoing prenatal care.  She is currently monitored for the following issues for this low-risk pregnancy and has Asthma; Human immunodeficiency virus (HIV) disease (HCC); Herpes; Mixed hyperlipidemia; Chronic bilateral low back pain without sciatica; Supervision of high risk pregnancy, antepartum; and Asymptomatic bacteriuria during pregnancy on their problem list.  Patient reports painful urination and vaginal pressure with urination. She also reports urgency and frequency .  Contractions: Not present. Vag. Bleeding: None.  Movement: Present. Denies leaking of fluid.   The following portions of the patient's history were reviewed and updated as appropriate: allergies, current medications, past family history, past medical history, past social history, past surgical history and problem list.   Objective:    Vitals:   10/13/23 1619  BP: 96/66  Pulse: 81  Weight: 159 lb (72.1 kg)    Fetal Status:  Fetal Heart Rate (bpm): 164   Movement: Present    General: Alert, oriented and cooperative. Patient is in no acute distress.  Skin: Skin is warm and dry. No rash noted.   Cardiovascular: Normal heart rate noted  Respiratory: Normal respiratory effort, no problems with respiration noted  Abdomen: Soft, gravid, appropriate for gestational age.  Pain/Pressure: Absent     Pelvic: Cervical exam deferred        Extremities: Normal range of motion.  Edema: None  Mental Status: Normal mood and affect. Normal behavior. Normal judgment and thought content.   Assessment and Plan:  Pregnancy: G7P6006 at [redacted]w[redacted]d 1. Encounter for supervision of low-risk pregnancy in second trimester (Primary) - patient doing well - Reports occasional flutters.   2. [redacted] weeks gestation of pregnancy - Discussed 2nd trimester expectations.   3. UTI symptoms - Plan to treat with Keflex and await culture results.  -  POCT Urinalysis Dipstick - Culture, OB Urine  Preterm labor symptoms and general obstetric precautions including but not limited to vaginal bleeding, contractions, leaking of fluid and fetal movement were reviewed in detail with the patient. Please refer to After Visit Summary for other counseling recommendations.   No follow-ups on file.  Future Appointments  Date Time Provider Department Center  11/03/2023 10:00 AM [facility] PROVIDER 1 [facility]  11/03/2023 10:30 AM [facility] US5 [facility]  11/10/2023  4:10 PM Letha Renshaw, CNM [facility]  11/19/2023 11:15 AM Melvenia Corean SAILOR, NP [facility]  12/08/2023  4:10 PM Emilio Delilah CHRISTELLA, CNM [facility]    Yancy Hascall Erven) Emilio, MSN, CNM  Center for [facility]  10/14/2023 2:56 PM

## 2023-10-16 LAB — URINE CULTURE, OB REFLEX

## 2023-10-16 LAB — CULTURE, OB URINE

## 2023-10-21 DIAGNOSIS — O99210 Obesity complicating pregnancy, unspecified trimester: Secondary | ICD-10-CM | POA: Insufficient documentation

## 2023-10-25 ENCOUNTER — Encounter: Payer: Self-pay | Admitting: Obstetrics and Gynecology

## 2023-10-25 DIAGNOSIS — O98719 Human immunodeficiency virus [HIV] disease complicating pregnancy, unspecified trimester: Secondary | ICD-10-CM | POA: Insufficient documentation

## 2023-10-26 ENCOUNTER — Encounter: Payer: Self-pay | Admitting: Family Medicine

## 2023-10-27 ENCOUNTER — Ambulatory Visit: Payer: Self-pay | Admitting: Certified Nurse Midwife

## 2023-10-27 MED ORDER — NITROFURANTOIN MONOHYD MACRO 100 MG PO CAPS: 100.0000 mg | ORAL_CAPSULE | Freq: Two times a day (BID) | ORAL | 0 refills | Status: DC

## 2023-11-03 ENCOUNTER — Ambulatory Visit: Attending: Obstetrics & Gynecology

## 2023-11-03 ENCOUNTER — Telehealth: Payer: Self-pay

## 2023-11-03 ENCOUNTER — Other Ambulatory Visit: Payer: Self-pay | Admitting: *Deleted

## 2023-11-03 ENCOUNTER — Ambulatory Visit (HOSPITAL_BASED_OUTPATIENT_CLINIC_OR_DEPARTMENT_OTHER): Admitting: Obstetrics

## 2023-11-03 VITALS — BP 115/68 | HR 96

## 2023-11-03 DIAGNOSIS — Z3A19 19 weeks gestation of pregnancy: Secondary | ICD-10-CM | POA: Insufficient documentation

## 2023-11-03 DIAGNOSIS — O0942 Supervision of pregnancy with grand multiparity, second trimester: Secondary | ICD-10-CM | POA: Insufficient documentation

## 2023-11-03 DIAGNOSIS — E669 Obesity, unspecified: Secondary | ICD-10-CM

## 2023-11-03 DIAGNOSIS — Z21 Asymptomatic human immunodeficiency virus [HIV] infection status: Secondary | ICD-10-CM | POA: Diagnosis not present

## 2023-11-03 DIAGNOSIS — O99212 Obesity complicating pregnancy, second trimester: Secondary | ICD-10-CM | POA: Insufficient documentation

## 2023-11-03 DIAGNOSIS — O98712 Human immunodeficiency virus [HIV] disease complicating pregnancy, second trimester: Secondary | ICD-10-CM | POA: Diagnosis not present

## 2023-11-03 DIAGNOSIS — O99210 Obesity complicating pregnancy, unspecified trimester: Secondary | ICD-10-CM

## 2023-11-03 DIAGNOSIS — O98719 Human immunodeficiency virus [HIV] disease complicating pregnancy, unspecified trimester: Secondary | ICD-10-CM

## 2023-11-03 DIAGNOSIS — O0990 Supervision of high risk pregnancy, unspecified, unspecified trimester: Secondary | ICD-10-CM | POA: Diagnosis not present

## 2023-11-03 DIAGNOSIS — O09892 Supervision of other high risk pregnancies, second trimester: Secondary | ICD-10-CM

## 2023-11-03 DIAGNOSIS — B20 Human immunodeficiency virus [HIV] disease: Secondary | ICD-10-CM | POA: Insufficient documentation

## 2023-11-03 NOTE — Progress Notes (Signed)
 MFM Consult Note  Erica Walton is currently at 19 weeks and 4 days.  She was seen for a detailed fetal anatomy scan due to maternal HIV, grand multiparity, and maternal obesity with a BMI of 31.  She is currently treated with Biktarvy .  Her most recent HIV viral load was undetectable.  She has 6 prior vaginal deliveries.  She denies any problems in her current pregnancy.    She had a cell free DNA test earlier in her pregnancy which indicated a low risk for trisomy 44, 48, and 13. A female fetus is predicted.  Her 6 other children are all girls.  She was informed that the fetal growth and amniotic fluid level were appropriate for her gestational age.   There were no obvious fetal anomalies noted on today's ultrasound exam.  The patient was informed that anomalies may be missed due to technical limitations. If the fetus is in a suboptimal position or maternal habitus is increased, visualization of the fetus in the maternal uterus may be impaired.  She was advised to continue close follow-up with her infectious disease doctor throughout her pregnancy.    She should continue taking Biktarvy  as prescribed to keep her viral loads undetectable.    Due to HIV, we will continue to follow her with growth ultrasounds throughout her pregnancy.    She should have her viral loads monitored at least once every trimester.    A follow-up growth scan was scheduled in 6 weeks.    The patient stated that all of her questions were answered today.  A total of 30 minutes was spent counseling and coordinating the care for this patient.  Greater than 50% of the time was spent in direct face-to-face contact.

## 2023-11-10 ENCOUNTER — Encounter: Payer: Self-pay | Admitting: Obstetrics and Gynecology

## 2023-11-10 ENCOUNTER — Ambulatory Visit (INDEPENDENT_AMBULATORY_CARE_PROVIDER_SITE_OTHER): Admitting: Obstetrics and Gynecology

## 2023-11-10 VITALS — BP 115/77 | HR 111 | Wt 166.0 lb

## 2023-11-10 DIAGNOSIS — Z3A20 20 weeks gestation of pregnancy: Secondary | ICD-10-CM | POA: Diagnosis not present

## 2023-11-10 DIAGNOSIS — O0990 Supervision of high risk pregnancy, unspecified, unspecified trimester: Secondary | ICD-10-CM

## 2023-11-10 DIAGNOSIS — B20 Human immunodeficiency virus [HIV] disease: Secondary | ICD-10-CM

## 2023-11-10 NOTE — Progress Notes (Signed)
  HIGH-RISK PREGNANCY OFFICE VISIT Patient name: Erica Walton MRN [id number]  Date of birth: 04/15/1996 Chief Complaint:   Routine Prenatal Visit  History of Present Illness:   Erica Walton is a 28 y.o. [phone] female at [redacted]w[redacted]d with an Estimated Date of Delivery: 03/25/24 being seen today for ongoing management of a high-risk pregnancy complicated by HIV Today she reports no complaints. Contractions: Not present. Vag. Bleeding: None.  Movement: Present. denies leaking of fluid.  Review of Systems:   Pertinent items are noted in HPI Denies abnormal vaginal discharge w/ itching/odor/irritation, headaches, visual changes, shortness of breath, chest pain, abdominal pain, severe nausea/vomiting, or problems with urination or bowel movements unless otherwise stated above. Pertinent History Reviewed:  Reviewed past medical,surgical, social, obstetrical and family history.  Reviewed problem list, medications and allergies. Physical Assessment:   Vitals:   11/10/23 1623  BP: 115/77  Pulse: (!) 111  Weight: 166 lb (75.3 kg)  Body mass index is 33.53 kg/m.           Physical Examination:   General appearance: alert, well appearing, and in no distress, oriented to person, place, and time, and overweight  Mental status: alert, oriented to person, place, and time, normal mood, behavior, speech, dress, motor activity, and thought processes  Skin: warm & dry   Extremities: Edema: None    Cardiovascular: normal heart rate noted  Respiratory: normal respiratory effort, no distress  Abdomen: gravid, soft, non-tender  Pelvic: Cervical exam deferred         Fetal Status: Fetal Heart Rate (bpm): 154 Fundal Height: 22 cm Movement: Present    Fetal Surveillance Testing today: none   No results found for this or any previous visit (from the past 24 hours).  Assessment & Plan:  1) High-risk pregnancy G7P6006 at [redacted]w[redacted]d with an Estimated Date of Delivery: 03/25/24   2) Supervision of high risk  pregnancy, antepartum (Primary) - Doing well  3) Human immunodeficiency virus (HIV) disease (HCC) - Managed by Infectious Disease - Taking Biktarvy   4) [redacted] weeks gestation of pregnancy    Meds: No orders of the defined types were placed in this encounter.   Labs/procedures today: none  Treatment Plan:  Serial U/S d/t HIV  Reviewed: Preterm labor symptoms and general obstetric precautions including but not limited to vaginal bleeding, contractions, leaking of fluid and fetal movement were reviewed in detail with the patient.  All questions were answered. Has home bp cuff. Check bp weekly, let us  know if >140/90.   Follow-up: Return in about 4 weeks (around 12/08/2023) for Return OB visit.  No orders of the defined types were placed in this encounter.  Zyire Eidson MSN, CNM 11/10/2023 4:31 PM

## 2023-11-10 NOTE — Progress Notes (Signed)
 ROB   CC: None

## 2023-11-19 ENCOUNTER — Ambulatory Visit: Admitting: Infectious Diseases

## 2023-12-08 ENCOUNTER — Encounter: Payer: Self-pay | Admitting: Certified Nurse Midwife

## 2023-12-08 ENCOUNTER — Ambulatory Visit (INDEPENDENT_AMBULATORY_CARE_PROVIDER_SITE_OTHER): Admitting: Certified Nurse Midwife

## 2023-12-08 VITALS — BP 104/64 | HR 94 | Wt 167.0 lb

## 2023-12-08 DIAGNOSIS — N949 Unspecified condition associated with female genital organs and menstrual cycle: Secondary | ICD-10-CM | POA: Diagnosis not present

## 2023-12-08 DIAGNOSIS — O0940 Supervision of pregnancy with grand multiparity, unspecified trimester: Secondary | ICD-10-CM | POA: Diagnosis not present

## 2023-12-08 DIAGNOSIS — O0992 Supervision of high risk pregnancy, unspecified, second trimester: Secondary | ICD-10-CM | POA: Diagnosis not present

## 2023-12-08 DIAGNOSIS — Z3A24 24 weeks gestation of pregnancy: Secondary | ICD-10-CM

## 2023-12-08 DIAGNOSIS — O479 False labor, unspecified: Secondary | ICD-10-CM | POA: Diagnosis not present

## 2023-12-08 NOTE — Progress Notes (Signed)
 ROB   CC: None

## 2023-12-08 NOTE — Progress Notes (Signed)
   PRENATAL VISIT NOTE  Subjective:  Erica Walton is a 28 y.o. G7P6006 at [redacted]w[redacted]d being seen today for ongoing prenatal care.  She is currently monitored for the following issues for this high-risk pregnancy and has Human immunodeficiency virus (HIV) disease (HCC); Supervision of high risk pregnancy, antepartum; Asymptomatic bacteriuria during pregnancy; Obesity affecting pregnancy, antepartum; and HIV affecting pregnancy, antepartum on their problem list.  Patient reports round ligament pain and braxton hicks contractions .  Contractions: Irritability. Vag. Bleeding: None.  Movement: Present. Denies leaking of fluid.   The following portions of the patient's history were reviewed and updated as appropriate: allergies, current medications, past family history, past medical history, past social history, past surgical history and problem list.   Objective:    Vitals:   12/08/23 1628  BP: 104/64  Pulse: 94  Weight: 167 lb (75.8 kg)    Fetal Status:  Fetal Heart Rate (bpm): 154   Movement: Present    General: Alert, oriented and cooperative. Patient is in no acute distress.  Skin: Skin is warm and dry. No rash noted.   Cardiovascular: Normal heart rate noted  Respiratory: Normal respiratory effort, no problems with respiration noted  Abdomen: Soft, gravid, appropriate for gestational age.  Pain/Pressure: Present     Pelvic: Cervical exam deferred        Extremities: Normal range of motion.  Edema: None  Mental Status: Normal mood and affect. Normal behavior. Normal judgment and thought content.   Assessment and Plan:  Pregnancy: G7P6006 at [redacted]w[redacted]d 1. Supervision of high risk pregnancy in second trimester (Primary)  5. Grand multiparity, antepartum - Patient doing well.  - Reports vigorous and frequent fetal movement   2. [redacted] weeks gestation of pregnancy - Growth scan next week - GTT at next visit.   3. Round ligament pain - Recommended continued used of maternity support belt   - Discussed stretches and other comfort measures   4. Braxton Hick's contraction - Reviewed braxton hicks contractions as normal discomforts of pregnancy. - Encouraged to stay hydrated and rest when available    Preterm labor symptoms and general obstetric precautions including but not limited to vaginal bleeding, contractions, leaking of fluid and fetal movement were reviewed in detail with the patient. Please refer to After Visit Summary for other counseling recommendations.   Return in about 4 weeks (around 01/05/2024) for HROB w GTT with CNM or MD .  Future Appointments  Date Time Provider Department Center  12/15/2023  2:15 PM [facility] PROVIDER 1 [facility]  12/15/2023  2:30 PM [facility] US3 [facility]  01/04/2024  8:15 AM [facility] LAB [facility]  01/04/2024  8:35 AM Izell Harari, MD [facility]  01/18/2024  4:10 PM Anyanwu, Gloris LABOR, MD [facility]  02/01/2024  4:10 PM Fredirick Glenys RAMAN, MD [facility]  02/15/2024  4:10 PM Anyanwu, Gloris LABOR, MD [facility]    Ashawnti Tangen Erven) Emilio, MSN, CNM  Center for [facility]  12/08/2023 4:54 PM

## 2023-12-15 ENCOUNTER — Ambulatory Visit: Attending: Obstetrics

## 2023-12-15 ENCOUNTER — Ambulatory Visit (HOSPITAL_BASED_OUTPATIENT_CLINIC_OR_DEPARTMENT_OTHER): Admitting: Obstetrics and Gynecology

## 2023-12-15 ENCOUNTER — Other Ambulatory Visit: Payer: Self-pay | Admitting: *Deleted

## 2023-12-15 DIAGNOSIS — O09892 Supervision of other high risk pregnancies, second trimester: Secondary | ICD-10-CM | POA: Insufficient documentation

## 2023-12-15 DIAGNOSIS — O99212 Obesity complicating pregnancy, second trimester: Secondary | ICD-10-CM | POA: Diagnosis not present

## 2023-12-15 DIAGNOSIS — E669 Obesity, unspecified: Secondary | ICD-10-CM

## 2023-12-15 DIAGNOSIS — O0942 Supervision of pregnancy with grand multiparity, second trimester: Secondary | ICD-10-CM

## 2023-12-15 DIAGNOSIS — O0990 Supervision of high risk pregnancy, unspecified, unspecified trimester: Secondary | ICD-10-CM | POA: Diagnosis not present

## 2023-12-15 DIAGNOSIS — O98712 Human immunodeficiency virus [HIV] disease complicating pregnancy, second trimester: Secondary | ICD-10-CM

## 2023-12-15 DIAGNOSIS — O358XX0 Maternal care for other (suspected) fetal abnormality and damage, not applicable or unspecified: Secondary | ICD-10-CM

## 2023-12-15 DIAGNOSIS — Z3A25 25 weeks gestation of pregnancy: Secondary | ICD-10-CM

## 2023-12-15 DIAGNOSIS — B20 Human immunodeficiency virus [HIV] disease: Secondary | ICD-10-CM

## 2023-12-15 NOTE — Progress Notes (Signed)
 Maternal-Fetal Medicine Consultation Name: Erica Walton MRN: [id number]  G7 P6006 at 25w 4d gestation. HIV complicating pregnancy.  Patient takes Biktarvy  regularly and recent viral RNA was undetectable.  Recent CD4 counts were within normal range.  Obstructive history significant for 6 term vaginal deliveries.  No history of postpartum hemorrhage.  Ultrasound Amniotic fluid normal good fetal activity seen.  Fetal growth is appropriate for gestational age.  HIV and pregnancy I explained significance of undetectable RNA and that it is associated with low perinatal transmission (not zero).  Vaginal delivery can be safely attempted.  Intrapartum Z DVT prophylaxis is not indicated if viral RNA is undetectable. I encouraged her to take influenza, Tdap and RSV immunization.  Patient is keen to take the vaccines.  Grand multigravida In the absence of postpartum hemorrhage and previous pregnancies, postpartum hemorrhage is less likely.  Recent hemoglobin was 12.6 g/dL.    We recommend serial fetal growth assessments.  Recommendations - Appointment was made for her to return in 4 weeks for fetal growth assessment   Consultation including face-to-face (more than 50%) counseling 20 minutes.

## 2023-12-29 ENCOUNTER — Encounter: Payer: Self-pay | Admitting: Family Medicine

## 2024-01-04 ENCOUNTER — Other Ambulatory Visit

## 2024-01-04 ENCOUNTER — Ambulatory Visit: Admitting: Obstetrics and Gynecology

## 2024-01-04 ENCOUNTER — Encounter: Payer: Self-pay | Admitting: Obstetrics and Gynecology

## 2024-01-04 VITALS — BP 111/76 | HR 108 | Wt 173.6 lb

## 2024-01-04 DIAGNOSIS — Z6835 Body mass index (BMI) 35.0-35.9, adult: Secondary | ICD-10-CM

## 2024-01-04 DIAGNOSIS — J45909 Unspecified asthma, uncomplicated: Secondary | ICD-10-CM

## 2024-01-04 DIAGNOSIS — Z3A28 28 weeks gestation of pregnancy: Secondary | ICD-10-CM | POA: Diagnosis not present

## 2024-01-04 DIAGNOSIS — O99891 Other specified diseases and conditions complicating pregnancy: Secondary | ICD-10-CM

## 2024-01-04 DIAGNOSIS — O99210 Obesity complicating pregnancy, unspecified trimester: Secondary | ICD-10-CM

## 2024-01-04 DIAGNOSIS — O98713 Human immunodeficiency virus [HIV] disease complicating pregnancy, third trimester: Secondary | ICD-10-CM | POA: Diagnosis not present

## 2024-01-04 DIAGNOSIS — O99820 Streptococcus B carrier state complicating pregnancy: Secondary | ICD-10-CM

## 2024-01-04 DIAGNOSIS — R8271 Bacteriuria: Secondary | ICD-10-CM

## 2024-01-04 DIAGNOSIS — Z641 Problems related to multiparity: Secondary | ICD-10-CM | POA: Insufficient documentation

## 2024-01-04 DIAGNOSIS — O99213 Obesity complicating pregnancy, third trimester: Secondary | ICD-10-CM | POA: Diagnosis not present

## 2024-01-04 DIAGNOSIS — O98719 Human immunodeficiency virus [HIV] disease complicating pregnancy, unspecified trimester: Secondary | ICD-10-CM | POA: Diagnosis not present

## 2024-01-04 DIAGNOSIS — O99019 Anemia complicating pregnancy, unspecified trimester: Secondary | ICD-10-CM | POA: Diagnosis not present

## 2024-01-04 DIAGNOSIS — O99513 Diseases of the respiratory system complicating pregnancy, third trimester: Secondary | ICD-10-CM

## 2024-01-04 MED ORDER — BREAST PUMP MISC: 0 refills | Status: DC

## 2024-01-04 NOTE — Progress Notes (Signed)
   PRENATAL VISIT NOTE  Subjective:  Erica Walton is a 28 y.o. G7P6006 at [redacted]w[redacted]d being seen today for ongoing prenatal care.  She is currently monitored for the following issues for this high-risk pregnancy and has Asthma affecting pregnancy in third trimester; Human immunodeficiency virus (HIV) disease (HCC); Supervision of high risk pregnancy, antepartum; Asymptomatic bacteriuria during pregnancy; Obesity affecting pregnancy, antepartum; HIV affecting pregnancy, antepartum; and BMI 35.0-35.9,adult on their problem list.  Patient reports no complaints.  Contractions: Irritability. Vag. Bleeding: None.  Movement: Present. Denies leaking of fluid.   The following portions of the patient's history were reviewed and updated as appropriate: allergies, current medications, past family history, past medical history, past social history, past surgical history and problem list.   Objective:    Vitals:   01/04/24 0831  BP: 111/76  Pulse: (!) 108  Weight: 173 lb 9.6 oz (78.7 kg)    Fetal Status:  Fetal Heart Rate (bpm): 150   Movement: Present    General: Alert, oriented and cooperative. Patient is in no acute distress.  Skin: Skin is warm and dry. No rash noted.   Cardiovascular: Normal heart rate noted  Respiratory: Normal respiratory effort, no problems with respiration noted  Abdomen: Soft, gravid, appropriate for gestational age.  Pain/Pressure: Present     Pelvic: Cervical exam deferred        Extremities: Normal range of motion.  Edema: None  Mental Status: Normal mood and affect. Normal behavior. Normal judgment and thought content.   Assessment and Plan:  Pregnancy: G7P6006 at [redacted]w[redacted]d 1. [redacted] weeks gestation of pregnancy (Primary) Labs today. nexplanon  - Glucose Tolerance, 2 Hours w/1 Hour - Tdap vaccine greater than or equal to 7yo IM - Flu vaccine trivalent PF, 6mos and older(Flulaval,Afluria,Fluarix,Fluzone) - RPR - CBC - Culture, OB Urine - Comprehensive metabolic panel  with GFR - HIV 1 RNA quant-no reflex-bld - T-helper cells (CD4) count - Misc. Devices (BREAST PUMP) MISC; Dispense one breast pump for patient  Dispense: 1 each; Refill: 0  2. HIV affecting pregnancy, antepartum Followed by ID. Missed last appt. Continue on biktarvy . Will get surveillance labs. VL undetectable and CD4 wnl April 2025 8/27: 60%, 884g, ac 64%, afi wnl  Continue mfm scans - Comprehensive metabolic panel with GFR - HIV 1 RNA quant-no reflex-bld - T-helper cells (CD4) count  3. Asymptomatic bacteriuria during pregnancy Test of cure today - Culture, OB Urine  4. Obesity affecting pregnancy, antepartum, unspecified obesity type  5. BMI 35.0-35.9,adult  6. Asthma affecting pregnancy in third trimester Continue symbicort   Preterm labor symptoms and general obstetric precautions including but not limited to vaginal bleeding, contractions, leaking of fluid and fetal movement were reviewed in detail with the patient. Please refer to After Visit Summary for other counseling recommendations.   No follow-ups on file.  Future Appointments  Date Time Provider Department Center  01/13/2024  3:30 PM [facility] PROVIDER 1 [facility]  01/13/2024  3:45 PM [facility] US4 [facility]  01/18/2024  4:10 PM Anyanwu, Gloris LABOR, MD [facility]  02/01/2024  4:10 PM Fredirick Glenys RAMAN, MD [facility]  02/10/2024  3:30 PM [facility] PROVIDER 1 [facility]  02/10/2024  3:45 PM [facility] US4 [facility]  02/15/2024  4:10 PM Anyanwu, Gloris LABOR, MD [facility]    Bebe Furry, MD

## 2024-01-05 ENCOUNTER — Ambulatory Visit: Payer: Self-pay | Admitting: Obstetrics and Gynecology

## 2024-01-05 DIAGNOSIS — O99013 Anemia complicating pregnancy, third trimester: Secondary | ICD-10-CM

## 2024-01-05 LAB — GLUCOSE TOLERANCE, 2 HOURS W/ 1HR
Glucose, 1 hour: 167 mg/dL (ref 70–179)
Glucose, 2 hour: 135 mg/dL (ref 70–152)
Glucose, Fasting: 82 mg/dL (ref 70–91)

## 2024-01-05 LAB — COMPREHENSIVE METABOLIC PANEL WITH GFR
ALT: 9 IU/L (ref 0–32)
AST: 13 IU/L (ref 0–40)
Albumin: 3.8 g/dL — ABNORMAL LOW (ref 4.0–5.0)
Alkaline Phosphatase: 99 IU/L (ref 41–116)
BUN/Creatinine Ratio: 9 (ref 9–23)
BUN: 4 mg/dL — ABNORMAL LOW (ref 6–20)
Bilirubin Total: <0.2 mg/dL (ref 0.0–1.2)
CO2: 19 mmol/L — ABNORMAL LOW (ref 20–29)
Calcium: 8.5 mg/dL — ABNORMAL LOW (ref 8.7–10.2)
Chloride: 103 mmol/L (ref 96–106)
Creatinine, Ser: 0.45 mg/dL — ABNORMAL LOW (ref 0.57–1.00)
Globulin, Total: 2.3 g/dL (ref 1.5–4.5)
Glucose: 171 mg/dL — ABNORMAL HIGH (ref 70–99)
Potassium: 4.6 mmol/L (ref 3.5–5.2)
Sodium: 135 mmol/L (ref 134–144)
Total Protein: 6.1 g/dL (ref 6.0–8.5)
eGFR: 133 mL/min/1.73 (ref >59)

## 2024-01-05 LAB — CBC
Hematocrit: 32.5 % — ABNORMAL LOW (ref 34.0–46.6)
Hemoglobin: 10.2 g/dL — ABNORMAL LOW (ref 11.1–15.9)
MCH: 27 pg (ref 26.6–33.0)
MCHC: 31.4 g/dL — ABNORMAL LOW (ref 31.5–35.7)
MCV: 86 fL (ref 79–97)
Platelets: 245 x10E3/uL (ref 150–450)
RBC: 3.78 x10E6/uL (ref 3.77–5.28)
RDW: 14 % (ref 11.7–15.4)
WBC: 8.5 x10E3/uL (ref 3.4–10.8)

## 2024-01-05 LAB — HIV-1 RNA QUANT-NO REFLEX-BLD: HIV-1 RNA Viral Load: <20 {copies}/mL

## 2024-01-05 LAB — RPR: RPR Ser Ql: NONREACTIVE

## 2024-01-06 LAB — SPECIMEN STATUS REPORT

## 2024-01-06 LAB — B12 AND FOLATE PANEL
Folate: >20 ng/mL (ref >3.0)
Vitamin B-12: 253 pg/mL (ref 232–1245)

## 2024-01-06 LAB — IRON AND TIBC
Iron Saturation: 7 % — CL (ref 15–55)
Iron: 37 ug/dL (ref 27–159)
Total Iron Binding Capacity: 532 ug/dL — ABNORMAL HIGH (ref 250–450)
UIBC: 495 ug/dL — ABNORMAL HIGH (ref 131–425)

## 2024-01-06 LAB — FERRITIN: Ferritin: 8 ng/mL — ABNORMAL LOW (ref 15–150)

## 2024-01-07 LAB — URINE CULTURE, OB REFLEX

## 2024-01-07 LAB — CULTURE, OB URINE

## 2024-01-13 ENCOUNTER — Telehealth: Payer: Self-pay

## 2024-01-13 ENCOUNTER — Ambulatory Visit: Attending: Obstetrics and Gynecology

## 2024-01-13 ENCOUNTER — Encounter: Payer: Self-pay | Admitting: Obstetrics and Gynecology

## 2024-01-13 ENCOUNTER — Ambulatory Visit (HOSPITAL_BASED_OUTPATIENT_CLINIC_OR_DEPARTMENT_OTHER): Admitting: Obstetrics and Gynecology

## 2024-01-13 VITALS — BP 123/72 | HR 101

## 2024-01-13 DIAGNOSIS — O98713 Human immunodeficiency virus [HIV] disease complicating pregnancy, third trimester: Secondary | ICD-10-CM

## 2024-01-13 DIAGNOSIS — Z3A29 29 weeks gestation of pregnancy: Secondary | ICD-10-CM

## 2024-01-13 DIAGNOSIS — O09893 Supervision of other high risk pregnancies, third trimester: Secondary | ICD-10-CM | POA: Insufficient documentation

## 2024-01-13 DIAGNOSIS — B20 Human immunodeficiency virus [HIV] disease: Secondary | ICD-10-CM

## 2024-01-13 DIAGNOSIS — O99212 Obesity complicating pregnancy, second trimester: Secondary | ICD-10-CM | POA: Insufficient documentation

## 2024-01-13 DIAGNOSIS — O358XX0 Maternal care for other (suspected) fetal abnormality and damage, not applicable or unspecified: Secondary | ICD-10-CM | POA: Insufficient documentation

## 2024-01-13 DIAGNOSIS — O99213 Obesity complicating pregnancy, third trimester: Secondary | ICD-10-CM

## 2024-01-13 DIAGNOSIS — O99891 Other specified diseases and conditions complicating pregnancy: Secondary | ICD-10-CM | POA: Diagnosis not present

## 2024-01-13 DIAGNOSIS — O0943 Supervision of pregnancy with grand multiparity, third trimester: Secondary | ICD-10-CM | POA: Diagnosis not present

## 2024-01-13 DIAGNOSIS — R8271 Bacteriuria: Secondary | ICD-10-CM | POA: Insufficient documentation

## 2024-01-13 DIAGNOSIS — O09892 Supervision of other high risk pregnancies, second trimester: Secondary | ICD-10-CM | POA: Diagnosis not present

## 2024-01-13 DIAGNOSIS — O0990 Supervision of high risk pregnancy, unspecified, unspecified trimester: Secondary | ICD-10-CM

## 2024-01-13 DIAGNOSIS — E669 Obesity, unspecified: Secondary | ICD-10-CM | POA: Diagnosis not present

## 2024-01-13 NOTE — Telephone Encounter (Signed)
 Dr. Izell, patient will be scheduled as soon as possible.  Auth Submission: NO AUTH NEEDED Site of care: Site of care: [facility] Payer: [employer] med[employer] Medication & CPT/J Code(s) submitted: Venofer (Iron Sucrose) J1756 Diagnosis Code:  Route of submission (phone, fax, portal):  Phone # Fax # Auth type: Buy/Bill PB Units/visits requested: 300mg  x 3 doses Reference number:  Approval from: 01/13/24 to 04/19/24

## 2024-01-13 NOTE — Progress Notes (Signed)
 Maternal-Fetal Medicine Consultation  Name: Erica Walton  MRN: [id number]  GA: [id number] [redacted]w[redacted]d   HIV complicating pregnancy.  Patient takes Biktarvy  regularly and recent viral RNA was undetectable.  Recent CD4 counts were within normal range.  Obstertric  history significant for 6 term vaginal deliveries.  No history of postpartum hemorrhage. She has iron deficiency anemia and will be starting iron infusions. Patient does not have gestational diabetes.  Ultrasound Amniotic fluid normal good fetal activity seen.  Fetal growth is appropriate for gestational age. I reassured the patient of the findings.   Today, we discussed bilateral tubal sterilization. I explained that it is possible to perform the procedure after delivery. Patient reported that she may plan another pregnancy before she is 23 years' old and will consider Nexplanon  after delivery.  Recommendations -An appointment was made for her to return in 4 weeks for fetal growth assessment.     Consultation including face-to-face (more than 50%) counseling 10 minutes.

## 2024-01-18 ENCOUNTER — Encounter: Admitting: Obstetrics & Gynecology

## 2024-01-19 ENCOUNTER — Ambulatory Visit: Admitting: Certified Nurse Midwife

## 2024-01-19 VITALS — BP 101/69 | HR 96 | Wt 172.0 lb

## 2024-01-19 DIAGNOSIS — O0940 Supervision of pregnancy with grand multiparity, unspecified trimester: Secondary | ICD-10-CM

## 2024-01-19 DIAGNOSIS — Z3A30 30 weeks gestation of pregnancy: Secondary | ICD-10-CM

## 2024-01-19 DIAGNOSIS — O0943 Supervision of pregnancy with grand multiparity, third trimester: Secondary | ICD-10-CM

## 2024-01-19 DIAGNOSIS — Z23 Encounter for immunization: Secondary | ICD-10-CM | POA: Diagnosis not present

## 2024-01-19 DIAGNOSIS — Z3493 Encounter for supervision of normal pregnancy, unspecified, third trimester: Secondary | ICD-10-CM

## 2024-01-19 NOTE — Progress Notes (Addendum)
   PRENATAL VISIT NOTE  Subjective:  Erica Walton is a 28 y.o. G7P6006 at [redacted]w[redacted]d being seen today for ongoing prenatal care.  She is currently monitored for the following issues for this high-risk pregnancy and has Asthma affecting pregnancy in third trimester; Human immunodeficiency virus (HIV) disease (HCC); Anemia during pregnancy in third trimester; Supervision of high risk pregnancy, antepartum; Asymptomatic bacteriuria during pregnancy; Obesity affecting pregnancy, antepartum; HIV affecting pregnancy, antepartum; BMI 35.0-35.9,adult; and Grand multiparity on their problem list.  Patient reports no complaints.  Contractions: Irritability. Vag. Bleeding: None.  Movement: Present. Denies leaking of fluid.   The following portions of the patient's history were reviewed and updated as appropriate: allergies, current medications, past family history, past medical history, past social history, past surgical history and problem list.   Objective:    Vitals:   01/19/24 1336  BP: 101/69  Pulse: 96  Weight: 172 lb (78 kg)    Fetal Status:  Fetal Heart Rate (bpm): 145   Movement: Present    General: Alert, oriented and cooperative. Patient is in no acute distress.  Skin: Skin is warm and dry. No rash noted.   Cardiovascular: Normal heart rate noted  Respiratory: Normal respiratory effort, no problems with respiration noted  Abdomen: Soft, gravid, appropriate for gestational age.  Pain/Pressure: Present     Pelvic: Cervical exam deferred        Extremities: Normal range of motion.  Edema: None  Mental Status: Normal mood and affect. Normal behavior. Normal judgment and thought content.   Assessment and Plan:  Pregnancy: G7P6006 at [redacted]w[redacted]d 1. Encounter for supervision of low-risk pregnancy in third trimester (Primary) - Patient doing well.  - Reports frequent and vigorous fetal movement   2. [redacted] weeks gestation of pregnancy - Reviewed plan for labor and 3rd trimester expectations.  -  Flu and Tdap vaccine given today.  - Iron infusions scheduled to start next week.   3. Grand multiparity, antepartum  Preterm labor symptoms and general obstetric precautions including but not limited to vaginal bleeding, contractions, leaking of fluid and fetal movement were reviewed in detail with the patient. Please refer to After Visit Summary for other counseling recommendations.   Return in about 2 weeks (around 02/02/2024) for LOB.  Future Appointments  Date Time Provider Department Center  01/25/2024  1:30 PM [facility] None  02/01/2024  1:30 PM [facility] 6 [facility] None  02/02/2024 11:15 AM Emilio Delilah CHRISTELLA, CNM [facility]  02/08/2024  1:30 PM [facility] 1 [facility] None  02/10/2024  3:30 PM [facility] PROVIDER 1 [facility]  02/10/2024  3:45 PM [facility] US4 [facility]  02/15/2024 11:15 AM Anyanwu, Gloris LABOR, MD [facility]  02/29/2024 10:55 AM Anyanwu, Gloris LABOR, MD [facility]  03/07/2024 10:55 AM Anyanwu, Gloris LABOR, MD [facility]  03/14/2024 10:35 AM Constant, Winton, MD [facility]    Sadey Yandell Erven) Emilio, MSN, CNM  Center for [facility]  01/19/2024 3:59 PM

## 2024-01-25 ENCOUNTER — Encounter: Payer: Self-pay | Admitting: Certified Nurse Midwife

## 2024-01-25 ENCOUNTER — Ambulatory Visit

## 2024-01-25 VITALS — BP 108/71 | HR 104 | Temp 98.1°F | Resp 16 | Ht <= 58 in | Wt 170.2 lb

## 2024-01-25 DIAGNOSIS — D649 Anemia, unspecified: Secondary | ICD-10-CM

## 2024-01-25 DIAGNOSIS — Z3A31 31 weeks gestation of pregnancy: Secondary | ICD-10-CM

## 2024-01-25 DIAGNOSIS — O99013 Anemia complicating pregnancy, third trimester: Secondary | ICD-10-CM | POA: Diagnosis not present

## 2024-01-25 MED ORDER — SODIUM CHLORIDE 0.9 % IV SOLN
300.0000 mg | INTRAVENOUS | Status: DC
  Administered 2024-01-25: 300 mg via INTRAVENOUS
  Filled 2024-01-25: qty 15

## 2024-01-25 NOTE — Progress Notes (Signed)
 Diagnosis: Acute Anemia  Provider:  Lonna Coder MD  Procedure: IV Infusion  IV Type: Peripheral, IV Location: R Antecubital  Venofer (Iron Sucrose), Dose: 300 mg  Infusion Start Time: 1433  Infusion Stop Time: 1612  Post Infusion IV Care: Observation period completed  Discharge: Condition: Good, Destination: Home . AVS Declined  Performed by:  Eleanor DELENA Bloch, RN

## 2024-01-29 ENCOUNTER — Encounter (HOSPITAL_COMMUNITY): Payer: Self-pay

## 2024-01-29 ENCOUNTER — Emergency Department (HOSPITAL_COMMUNITY)

## 2024-01-29 ENCOUNTER — Emergency Department (HOSPITAL_COMMUNITY)
Admission: EM | Admit: 2024-01-29 | Discharge: 2024-01-29 | Disposition: A | Discharge status: Home / Self Care | Attending: Emergency Medicine | Admitting: Emergency Medicine

## 2024-01-29 ENCOUNTER — Other Ambulatory Visit: Payer: Self-pay

## 2024-01-29 DIAGNOSIS — R059 Cough, unspecified: Secondary | ICD-10-CM | POA: Diagnosis present

## 2024-01-29 DIAGNOSIS — J069 Acute upper respiratory infection, unspecified: Secondary | ICD-10-CM | POA: Diagnosis not present

## 2024-01-29 DIAGNOSIS — Z91040 Latex allergy status: Secondary | ICD-10-CM | POA: Diagnosis not present

## 2024-01-29 LAB — RESP PANEL BY RT-PCR (RSV, FLU A&B, COVID)  RVPGX2
Influenza A by PCR: NEGATIVE
Influenza B by PCR: NEGATIVE
Resp Syncytial Virus by PCR: NEGATIVE
SARS Coronavirus 2 by RT PCR: NEGATIVE

## 2024-01-29 NOTE — ED Notes (Signed)
 Pt provided d/c paperwork and expressed understanding.

## 2024-01-29 NOTE — ED Triage Notes (Signed)
 Pt c/o cough, rhinitis and vomiting post cough x 4 days.  Pt appears in no distress.  VS stable.  Pt is [redacted] weeks pregnant.

## 2024-01-29 NOTE — ED Triage Notes (Signed)
 Pt states she has pain with cough in her chest. Pt has hx PNA.  Pt states it hurt to breathe in.

## 2024-01-29 NOTE — Discharge Instructions (Signed)
 Please call your OB and let them know that you are sick.  See what medications they think are safe to take with pregnancy.  Please return for worsening difficulty breathing or confusion or inability eat or drink.

## 2024-01-29 NOTE — ED Provider Triage Note (Signed)
 Emergency Medicine Provider Triage Evaluation Note  Erica Walton , a 28 y.o. female  was evaluated in triage.  Pt complains of cough and congestion. Sx since Wednesday. Hx of pneumonia, is concerned for same. Had fever 101 last night. Patient is [redacted] weeks pregnant, feeling regular movement. Denies bleeding or discharge.  Review of Systems  Positive:  Negative:   Physical Exam  BP 116/72   Pulse (!) 101   Temp 98.9 F (37.2 C)   Resp 16   Ht 4' 10.5 (1.486 m)   Wt 77.2 kg   LMP 06/19/2023 (Exact Date)   SpO2 100%   BMI 34.97 kg/m  Gen:   Awake, no distress   Resp:  Normal effort  MSK:   Moves extremities without difficulty  Other:    Medical Decision Making  Medically screening exam initiated at 2:35 PM.  Appropriate orders placed.  Erica Walton was informed that the remainder of the evaluation will be completed by another provider, this initial triage assessment does not replace that evaluation, and the importance of remaining in the ED until their evaluation is complete.     Nora Lauraine LABOR, PA-C 01/29/24 1437

## 2024-01-29 NOTE — ED Provider Notes (Signed)
 [place] EMERGENCY DEPARTMENT AT [facility] Provider Note   CSN: [id number] Arrival date & time: 01/29/24  1258     Patient presents with: Cough   Erica Walton is a 28 y.o. female.   28 yo F with a chief complaints of cough congestion going on for the past 4 days or so.  She is [redacted] weeks pregnant.  No issues thus far this pregnancy.  No pain no gush of fluids no bleeding.  Feeling baby move normally.      Cough      Prior to Admission medications   Medication Sig Start Date End Date Taking? Authorizing Provider  acetaminophen  (TYLENOL ) 325 MG tablet Take 2 tablets (650 mg total) by mouth every 6 (six) hours as needed. 05/14/23   Elnor Jayson LABOR, DO  bictegravir-emtricitabine -tenofovir  AF (BIKTARVY ) 50-200-25 MG TABS tablet Take 1 tablet by mouth daily. 07/27/23   Melvenia Corean SAILOR, NP  budesonide -formoterol  (SYMBICORT ) 80-4.5 MCG/ACT inhaler Inhale 2 puffs into the lungs 2 (two) times daily. Patient taking differently: Inhale 2 puffs into the lungs as needed (as need). 10/05/22   Towana Small, FNP  Misc. Devices (BREAST PUMP) MISC Dispense one breast pump for patient 01/04/24   Izell Harari, MD  Prenatal Vit-Fe Phos-FA-Omega (VITAFOL  GUMMIES) 3.33-0.333-34.8 MG CHEW Chew 1 tablet by mouth daily. 08/02/23   Anyanwu, Ugonna A, MD    Allergies: Latex, Norco [hydrocodone -acetaminophen ], and Penicillins    Review of Systems  Respiratory:  Positive for cough.     Updated Vital Signs BP 116/72   Pulse (!) 101   Temp 98.9 F (37.2 C)   Resp 16   Ht 4' 10.5 (1.486 m)   Wt 77.2 kg   LMP 06/19/2023 (Exact Date)   SpO2 100%   BMI 34.97 kg/m   Physical Exam Vitals and nursing note reviewed.  Constitutional:      General: She is not in acute distress.    Appearance: She is well-developed. She is not diaphoretic.  HENT:     Head: Normocephalic and atraumatic.     Comments: Swollen turbinates posterior nasal drip Eyes:     Pupils: Pupils are equal,  round, and reactive to light.  Cardiovascular:     Rate and Rhythm: Normal rate and regular rhythm.     Heart sounds: No murmur heard.    No friction rub. No gallop.  Pulmonary:     Effort: Pulmonary effort is normal.     Breath sounds: No wheezing or rales.  Abdominal:     General: There is no distension.     Palpations: Abdomen is soft.     Tenderness: There is no abdominal tenderness.  Musculoskeletal:        General: No tenderness.     Cervical back: Normal range of motion and neck supple.  Skin:    General: Skin is warm and dry.  Neurological:     Mental Status: She is alert and oriented to person, place, and time.  Psychiatric:        Behavior: Behavior normal.     (all labs ordered are listed, but only abnormal results are displayed) Labs Reviewed  RESP PANEL BY RT-PCR (RSV, FLU A&B, COVID)  [id number]    EKG: None  Radiology: DG Chest 2 View Result Date: 01/29/2024 CLINICAL DATA:  Cough and vomiting x4 days. EXAM: CHEST - 2 VIEW COMPARISON:  May 13, 2023 FINDINGS: The heart size and mediastinal contours are within normal limits. Mild diffusely increased lung  markings are noted. There is no evidence of acute infiltrate, pleural effusion or pneumothorax. The visualized skeletal structures are unremarkable. IMPRESSION: No active cardiopulmonary disease. Electronically Signed   By: Suzen Dials M.D.   On: 01/29/2024 15:11     Procedures   Medications Ordered in the ED - No data to display                                  Medical Decision Making  28 yo F with a chief complaint of cough and congestion going on for about 3 to 4 days.  She is well-appearing and nontoxic.  Has coarse breath sounds bilaterally but none clinically concerning for pneumonia.  Chest x-ray independently interpreted by me without focal infiltrate or pneumothorax.  No tachypnea no hypoxia.  Will treat supportively.  OB follow-up.  4:31 PM:  I have discussed the  diagnosis/risks/treatment options with the patient.  Evaluation and diagnostic testing in the emergency department does not suggest an emergent condition requiring admission or immediate intervention beyond what has been performed at this time.  They will follow up with PCP. We also discussed returning to the ED immediately if new or worsening sx occur. We discussed the sx which are most concerning (e.g., sudden worsening pain, fever, inability to tolerate by mouth) that necessitate immediate return. Medications administered to the patient during their visit and any new prescriptions provided to the patient are listed below.  Medications given during this visit Medications - No data to display   The patient appears reasonably screen and/or stabilized for discharge and I doubt any other medical condition or other [facility] requiring further screening, evaluation, or treatment in the ED at this time prior to discharge.       Final diagnoses:  Viral URI with cough    ED Discharge Orders     None          Emil Share, DO 01/29/24 1631

## 2024-02-01 ENCOUNTER — Encounter: Admitting: Family Medicine

## 2024-02-01 ENCOUNTER — Inpatient Hospital Stay (HOSPITAL_COMMUNITY)

## 2024-02-01 ENCOUNTER — Ambulatory Visit

## 2024-02-01 ENCOUNTER — Inpatient Hospital Stay (HOSPITAL_COMMUNITY)
Admission: AD | Admit: 2024-02-01 | Discharge: 2024-02-01 | Disposition: A | Discharge status: Home / Self Care | Attending: Obstetrics & Gynecology | Admitting: Obstetrics & Gynecology

## 2024-02-01 ENCOUNTER — Encounter (HOSPITAL_COMMUNITY): Payer: Self-pay | Admitting: Obstetrics & Gynecology

## 2024-02-01 DIAGNOSIS — O4693 Antepartum hemorrhage, unspecified, third trimester: Secondary | ICD-10-CM

## 2024-02-01 DIAGNOSIS — Z3A32 32 weeks gestation of pregnancy: Secondary | ICD-10-CM

## 2024-02-01 DIAGNOSIS — O23593 Infection of other part of genital tract in pregnancy, third trimester: Secondary | ICD-10-CM | POA: Diagnosis not present

## 2024-02-01 DIAGNOSIS — O26853 Spotting complicating pregnancy, third trimester: Secondary | ICD-10-CM | POA: Insufficient documentation

## 2024-02-01 DIAGNOSIS — O2313 Infections of bladder in pregnancy, third trimester: Secondary | ICD-10-CM

## 2024-02-01 DIAGNOSIS — B9689 Other specified bacterial agents as the cause of diseases classified elsewhere: Secondary | ICD-10-CM | POA: Diagnosis not present

## 2024-02-01 DIAGNOSIS — R300 Dysuria: Secondary | ICD-10-CM | POA: Insufficient documentation

## 2024-02-01 DIAGNOSIS — O0990 Supervision of high risk pregnancy, unspecified, unspecified trimester: Secondary | ICD-10-CM

## 2024-02-01 DIAGNOSIS — O99013 Anemia complicating pregnancy, third trimester: Secondary | ICD-10-CM | POA: Diagnosis not present

## 2024-02-01 DIAGNOSIS — O99891 Other specified diseases and conditions complicating pregnancy: Secondary | ICD-10-CM

## 2024-02-01 LAB — URINALYSIS, ROUTINE W REFLEX MICROSCOPIC
Bilirubin Urine: NEGATIVE
Glucose, UA: NEGATIVE mg/dL
Ketones, ur: NEGATIVE mg/dL
Nitrite: NEGATIVE
Protein, ur: 100 mg/dL — AB
RBC / HPF: >50 RBC/hpf (ref 0–5)
Specific Gravity, Urine: 1.02 (ref 1.005–1.030)
WBC, UA: >50 WBC/hpf (ref 0–5)
pH: 6 (ref 5.0–8.0)

## 2024-02-01 LAB — CBC
HCT: 29.6 % — ABNORMAL LOW (ref 36.0–46.0)
Hemoglobin: 9.6 g/dL — ABNORMAL LOW (ref 12.0–15.0)
MCH: 26.8 pg (ref 26.0–34.0)
MCHC: 32.4 g/dL (ref 30.0–36.0)
MCV: 82.7 fL (ref 80.0–100.0)
Platelets: 202 K/uL (ref 150–400)
RBC: 3.58 MIL/uL — ABNORMAL LOW (ref 3.87–5.11)
RDW: 15.7 % — ABNORMAL HIGH (ref 11.5–15.5)
WBC: 11.9 K/uL — ABNORMAL HIGH (ref 4.0–10.5)
nRBC: 0 % (ref 0.0–0.2)

## 2024-02-01 LAB — WET PREP, GENITAL
Sperm: NONE SEEN
Trich, Wet Prep: NONE SEEN
WBC, Wet Prep HPF POC: >=10 — AB (ref <10)
Yeast Wet Prep HPF POC: NONE SEEN

## 2024-02-01 LAB — POCT FERN TEST: POCT Fern Test: NEGATIVE

## 2024-02-01 MED ORDER — CEFADROXIL 500 MG PO CAPS: 500.0000 mg | ORAL_CAPSULE | Freq: Two times a day (BID) | ORAL | 0 refills | Status: AC

## 2024-02-01 MED ORDER — METRONIDAZOLE 500 MG PO TABS: 500.0000 mg | ORAL_TABLET | Freq: Two times a day (BID) | ORAL | 0 refills | Status: AC

## 2024-02-01 MED ORDER — FLUCONAZOLE 150 MG PO TABS: 150.0000 mg | ORAL_TABLET | Freq: Every day | ORAL | 0 refills | Status: DC

## 2024-02-01 NOTE — MAU Provider Note (Signed)
 History     [id number]  Arrival date and time: 02/01/24 0240    No chief complaint on file.    HPI Erica Walton is a 28 y.o. at [redacted]w[redacted]d by LMP, who presents for vaginal bleeding, spotting and abdominal pressure. Also complains of dysuria and increased frequency. She states that she went to the restroom and felt a large amount of fluid and also had red spotting with eraser sized clots. Started feeling vaginal pressure around 4pm yesterday. She denies regular contractions, endorses good fetal movement. Has sex 3 or 4 days ago.    B/Positive/-- (05/28 1057)  Past Medical History:  Diagnosis Date   Anemia of pregnancy in third trimester 04/10/2022   Asthma    Chronic bilateral low back pain without sciatica 10/05/2022   Dyshidrotic eczema 06/16/2021   Family history of Wilson's disease 06/23/2021   Genital herpes    Grand multiparity, antepartum 12/08/2021   Herpes 10/06/2013   PPX at 36 wks     HIV (human immunodeficiency virus infection) (HCC)    HIV (human immunodeficiency virus) risk factors complicating pregnancy, third trimester 12/08/2021   [ ]  get VL at [facility] visit, per ID   HIV positive (HCC) 06/16/2021   Hypolipidemia    Mixed hyperlipidemia 06/23/2021   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   Supervision of high risk pregnancy, antepartum 11/17/2021          Nursing Staff  Provider  Office Location   [place]  Dating   Early scan   [facility] Model  [x ] Traditional  [ ]  Centering  [ ]  Mom-Baby Dyad        Language   english  Anatomy US    complete  Flu Vaccine   03/02/22  Genetic/Carrier Screen   NIPS:   LR female  AFP:     [place]:  TDaP Vaccine    04/07/22  Hgb A1C or   GTT  Early   Third trimester   COVID Vaccine  No     LAB RESULTS   Rhogam      Blood T    Past Surgical History:  Procedure Laterality Date   APPENDECTOMY     LAPAROSCOPIC APPENDECTOMY N/A 07/22/2019   Procedure: APPENDECTOMY LAPAROSCOPIC;  Surgeon: Rodolph Romano, MD;  Location: [facility] ORS;  Service: General;   Laterality: N/A;    Family History  Problem Relation Age of Onset   Asthma Mother    Wilson's disease Mother    Obesity Father        history of addiction   Hypertension Father    Asthma Father    Cancer Father    Leukemia Father        CML   Asthma Sister    Asthma Brother    Diabetes Maternal Uncle    Obesity Maternal Grandmother    Diabetes Maternal Grandmother    Asthma Maternal Grandmother    Obesity Paternal Grandfather    Hypertension Paternal Grandfather    Heart disease Paternal Grandfather    Cancer Paternal Grandfather    Uterine cancer Maternal Great-grandmother    Breast cancer Other    Stroke Neg Hx     Social History   Socioeconomic History   Marital status: Married    Spouse name: Fatoumata Albaugh    Number of children: 4   Years of education: Not on file   Highest education level: Not on file  Occupational History   Not on file  Tobacco Use   Smoking status: Never  Smokeless tobacco: Never  Vaping Use   Vaping status: Never Used  Substance and Sexual Activity   Alcohol use: Not Currently    Comment: 2018   Drug use: Never   Sexual activity: Yes    Birth control/protection: None    Comment: DECLINED CONDOMS  Other Topics Concern   Not on file  Social History Narrative   Not on file   Social Drivers of Health   Financial Resource Strain: Low Risk  (06/18/2022)   Overall Financial Resource Strain (CARDIA)    Difficulty of Paying Living Expenses: Not hard at all  Food Insecurity: No Food Insecurity (06/18/2022)   Hunger Vital Sign    Worried About Running Out of Food in the Last Year: Never true    Ran Out of Food in the Last Year: Never true  Transportation Needs: No Transportation Needs (06/18/2022)   PRAPARE - Administrator, Civil Service (Medical): No    Lack of Transportation (Non-Medical): No  Physical Activity: Not on file  Stress: No Stress Concern Present (10/15/2022)   [employer] of Occupational Health -  Occupational Stress Questionnaire    Feeling of Stress : Only a little  Social Connections: Not on file  Intimate Partner Violence: Not At Risk (06/18/2022)   Humiliation, Afraid, Rape, and Kick questionnaire    Fear of Current or Ex-Partner: No    Emotionally Abused: No    Physically Abused: No    Sexually Abused: No    Allergies  Allergen Reactions   Latex Rash   Norco [Hydrocodone -Acetaminophen ] Rash   Penicillins Rash and Other (See Comments)    Has patient had a PCN reaction causing immediate rash, facial/tongue/throat swelling, SOB or lightheadedness with hypotension: yes, Mjdy:[id number]} Has patient had a PCN reaction causing severe rash involving mucus membranes or skin necrosis: no:[id number]} Has patient had a PCN reaction that required hospitalization no:[id number]} Has patient had a PCN reaction occurring within the last 10 years: no:[id number]} If all of the above answers are NO, then may proceed with Cephalosporin use.     No current facility-administered medications on file prior to encounter.   Current Outpatient Medications on File Prior to Encounter  Medication Sig Dispense Refill   acetaminophen  (TYLENOL ) 325 MG tablet Take 2 tablets (650 mg total) by mouth every 6 (six) hours as needed. 36 tablet 0   bictegravir-emtricitabine -tenofovir  AF (BIKTARVY ) 50-200-25 MG TABS tablet Take 1 tablet by mouth daily. 30 tablet 11   Prenatal Vit-Fe Phos-FA-Omega (VITAFOL  GUMMIES) 3.33-0.333-34.8 MG CHEW Chew 1 tablet by mouth daily. 90 tablet 5   budesonide -formoterol  (SYMBICORT ) 80-4.5 MCG/ACT inhaler Inhale 2 puffs into the lungs 2 (two) times daily. (Patient taking differently: Inhale 2 puffs into the lungs as needed (as need).) 1 each 3   Misc. Devices (BREAST PUMP) MISC Dispense one breast pump for patient 1 each 0    Pertinent positives and negative per HPI, all others reviewed and negative  Physical Exam   BP (!) 108/59 (BP Location: Right Arm)   Pulse (!) 101   Temp  98.1 F (36.7 C) (Oral)   Resp 14   Ht 4' 10.5 (1.486 m)   Wt 78.7 kg   LMP 06/19/2023 (Exact Date)   BMI 35.62 kg/m   Patient Vitals for the past 24 hrs:  BP Temp Temp src Pulse Resp Height Weight  02/01/24 0307 (!) 108/59 98.1 F (36.7 C) Oral (!) 101 14 4' 10.5 (1.486 m) 78.7 kg    Physical Exam Vitals  and nursing note reviewed. Exam conducted with a chaperone present.  Constitutional:      Appearance: She is well-developed.  HENT:     Head: Normocephalic and atraumatic.     Mouth/Throat:     Mouth: Mucous membranes are moist.  Eyes:     Extraocular Movements: Extraocular movements intact.  Cardiovascular:     Rate and Rhythm: Normal rate and regular rhythm.  Pulmonary:     Effort: Pulmonary effort is normal.  Abdominal:     Palpations: Abdomen is soft.     Tenderness: There is no abdominal tenderness.  Genitourinary:    Exam position: Lithotomy position.     Comments: Cervix friable with no pooling of fluid. Cervix appears closed.  Skin:    Capillary Refill: Capillary refill takes less than 2 seconds.  Neurological:     General: No focal deficit present.     Mental Status: She is alert.      FHT Baseline: 130 bpm Variability: Good {> 6 bpm) Accelerations: Reactive Decelerations: Absent Uterine activity: None  Labs Results for orders placed or performed during the hospital encounter of 02/01/24 (from the past 24 hours)  CBC     Status: Abnormal   Collection Time: 02/01/24  3:57 AM  Result Value Ref Range   WBC 11.9 (H) 4.0 - 10.5 K/uL   RBC 3.58 (L) 3.87 - 5.11 MIL/uL   Hemoglobin 9.6 (L) 12.0 - 15.0 g/dL   HCT 70.3 (L) 63.9 - 53.9 %   MCV 82.7 80.0 - 100.0 fL   MCH 26.8 26.0 - 34.0 pg   MCHC 32.4 30.0 - 36.0 g/dL   RDW 84.2 (H) 88.4 - 84.4 %   Platelets 202 150 - 400 K/uL   nRBC 0.0 0.0 - 0.2 %  Wet prep, genital     Status: Abnormal   Collection Time: 02/01/24  4:40 AM   Specimen: Vaginal  Result Value Ref Range   Yeast Wet Prep HPF POC NONE  SEEN NONE SEEN   Trich, Wet Prep NONE SEEN NONE SEEN   Clue Cells Wet Prep HPF POC PRESENT (A) NONE SEEN   WBC, Wet Prep HPF POC >=10 (A) <10   Sperm NONE SEEN   Urinalysis, Routine w reflex microscopic -Urine, Clean Catch     Status: Abnormal   Collection Time: 02/01/24  4:41 AM  Result Value Ref Range   Color, Urine YELLOW YELLOW   APPearance CLOUDY (A) CLEAR   Specific Gravity, Urine 1.020 1.005 - 1.030   pH 6.0 5.0 - 8.0   Glucose, UA NEGATIVE NEGATIVE mg/dL   Hgb urine dipstick LARGE (A) NEGATIVE   Bilirubin Urine NEGATIVE NEGATIVE   Ketones, ur NEGATIVE NEGATIVE mg/dL   Protein, ur 899 (A) NEGATIVE mg/dL   Nitrite NEGATIVE NEGATIVE   Leukocytes,Ua MODERATE (A) NEGATIVE   RBC / HPF >50 0 - 5 RBC/hpf   WBC, UA >50 0 - 5 WBC/hpf   Bacteria, UA RARE (A) NONE SEEN   Squamous Epithelial / HPF 0-5 0 - 5 /HPF   WBC Clumps PRESENT    Mucus PRESENT   POCT fern test     Status: Normal   Collection Time: 02/01/24  4:46 AM  Result Value Ref Range   POCT Fern Test Negative = intact amniotic membranes     Imaging No results found.  MAU Course  Procedures  Lab Orders         Wet prep, genital  Culture, OB Urine         CBC         Urinalysis, Routine w reflex microscopic -Urine, Clean Catch         POCT fern test    Meds ordered this encounter  Medications   metroNIDAZOLE  (FLAGYL ) 500 MG tablet    Sig: Take 1 tablet (500 mg total) by mouth 2 (two) times daily for 7 days.    Dispense:  14 tablet    Refill:  0   cefadroxil (DURICEF) 500 MG capsule    Sig: Take 1 capsule (500 mg total) by mouth 2 (two) times daily for 7 days.    Dispense:  14 capsule    Refill:  0   fluconazole  (DIFLUCAN ) 150 MG tablet    Sig: Take 1 tablet (150 mg total) by mouth daily.    Dispense:  1 tablet    Refill:  0    Imaging Orders         US  MFM OB LIMITED     MDM Moderate (Level 3-4)  Assessment and Plan  #Third Trimester Vaginal Bleeding #[redacted] weeks gestation of  pregnancy #Concern for acute cystitis #Bacterial Vaginosis  #FWB: NST: Reactive  Erica Walton is a 28 y.o. at [redacted]w[redacted]d by LMP, who presents for vaginal bleeding, spotting and abdominal pressure.  -Complains of VB, dysuria. Complains it feels like a UTI. -US  in MAU shows no evidence of placental abruption -U/A concerning for UTI with hematuria.  -Wet prep concerning for BV. -CBC shows worsening anemia with Hgb 9.6  -Stable to discharge home -SSE shows no evidence of pooling. Does have friable cervix, possibly from recent sexual activity. Vaginal bleeding likely hematuria vs cervical/vaginal/uterine bleeding -Sent Rx for duracef for 7 day course. (Patient has tolerated cephalosporins in the past) -Rx sent for metronidazole  for BV -Sent single diflucan  pill as patient will be on two antibiotics for the next week. Gave instructions on timing. -Discussed iron supplementation for worsening anemia.  -Urine culture pending.  -All questions answered, anticipatory guidance and detailed return precautions provided.     Leydi Winstead L Layman Gully, MD/MHA 02/01/24 5:06 AM  Allergies as of 02/01/2024       Reactions   Latex Rash   Norco [hydrocodone -acetaminophen ] Rash   Penicillins Rash, Other (See Comments)   Has patient had a PCN reaction causing immediate rash, facial/tongue/throat swelling, SOB or lightheadedness with hypotension: yes, Mjdy:[id number]} Has patient had a PCN reaction causing severe rash involving mucus membranes or skin necrosis: no:[id number]} Has patient had a PCN reaction that required hospitalization no:30480221} Has patient had a PCN reaction occurring within the last 10 years: no:[id number]} If all of the above answers are NO, then may proceed with Cephalosporin use.        Medication List     TAKE these medications    acetaminophen  325 MG tablet Commonly known as: Tylenol  Take 2 tablets (650 mg total) by mouth every 6 (six) hours as needed.   Biktarvy  50-200-25  MG Tabs tablet Generic drug: bictegravir-emtricitabine -tenofovir  AF Take 1 tablet by mouth daily.   Breast Pump Misc Dispense one breast pump for patient   budesonide -formoterol  80-4.5 MCG/ACT inhaler Commonly known as: SYMBICORT  Inhale 2 puffs into the lungs 2 (two) times daily. What changed:  when to take this reasons to take this   cefadroxil 500 MG capsule Commonly known as: DURICEF Take 1 capsule (500 mg total) by mouth 2 (two) times daily for 7 days.   fluconazole  150 MG  tablet Commonly known as: Diflucan  Take 1 tablet (150 mg total) by mouth daily.   metroNIDAZOLE  500 MG tablet Commonly known as: FLAGYL  Take 1 tablet (500 mg total) by mouth 2 (two) times daily for 7 days.   Vitafol  Gummies 3.33-0.333-34.8 MG Chew Chew 1 tablet by mouth daily.

## 2024-02-01 NOTE — MAU Note (Signed)
 Pt says at 0215- went to b-room- felt trickle - large amt .  No fluid now.   Has  red spotting - started 4pm - when she wipes -clots- pencil eraser  size. Feels pressure in vag at 4pm   lower abd -started at 0215  Feels baby moving

## 2024-02-02 ENCOUNTER — Ambulatory Visit: Admitting: Certified Nurse Midwife

## 2024-02-02 ENCOUNTER — Other Ambulatory Visit: Payer: Self-pay | Admitting: Genetic Counselor

## 2024-02-02 DIAGNOSIS — Z006 Encounter for examination for normal comparison and control in clinical research program: Secondary | ICD-10-CM

## 2024-02-03 LAB — CULTURE, OB URINE: Culture: >=100000 — AB

## 2024-02-08 ENCOUNTER — Ambulatory Visit (INDEPENDENT_AMBULATORY_CARE_PROVIDER_SITE_OTHER)

## 2024-02-08 VITALS — BP 120/84 | HR 93 | Temp 98.0°F | Resp 18 | Ht 58.5 in | Wt 172.3 lb

## 2024-02-08 DIAGNOSIS — O99013 Anemia complicating pregnancy, third trimester: Secondary | ICD-10-CM

## 2024-02-08 DIAGNOSIS — D649 Anemia, unspecified: Secondary | ICD-10-CM | POA: Diagnosis not present

## 2024-02-08 DIAGNOSIS — Z3A33 33 weeks gestation of pregnancy: Secondary | ICD-10-CM | POA: Diagnosis not present

## 2024-02-08 MED ORDER — SODIUM CHLORIDE 0.9 % IV SOLN
300.0000 mg | INTRAVENOUS | Status: DC
  Administered 2024-02-08: 300 mg via INTRAVENOUS
  Filled 2024-02-08: qty 15

## 2024-02-08 NOTE — Progress Notes (Signed)
 Diagnosis: Anemia during pregnancy    Provider:  Lonna Coder MD  Procedure: IV Infusion  IV Type: Peripheral, IV Location: R Antecubital  Venofer (Iron Sucrose), Dose: 300 mg  Infusion Start Time    1418  Infusion Stop Time: 1555  Post Infusion IV Care: Observation period completed and Peripheral IV Discontinued  Discharge: Condition: Good, Destination: Home . AVS Declined  Performed by:  Aoi Kouns G Pilkington-Burchett, RN

## 2024-02-10 ENCOUNTER — Ambulatory Visit: Admitting: Obstetrics

## 2024-02-10 ENCOUNTER — Ambulatory Visit: Attending: Obstetrics and Gynecology

## 2024-02-10 VITALS — BP 111/64 | HR 94

## 2024-02-10 DIAGNOSIS — O99210 Obesity complicating pregnancy, unspecified trimester: Secondary | ICD-10-CM | POA: Insufficient documentation

## 2024-02-10 DIAGNOSIS — O0990 Supervision of high risk pregnancy, unspecified, unspecified trimester: Secondary | ICD-10-CM | POA: Diagnosis not present

## 2024-02-10 DIAGNOSIS — E669 Obesity, unspecified: Secondary | ICD-10-CM

## 2024-02-10 DIAGNOSIS — O99212 Obesity complicating pregnancy, second trimester: Secondary | ICD-10-CM | POA: Diagnosis not present

## 2024-02-10 DIAGNOSIS — O99213 Obesity complicating pregnancy, third trimester: Secondary | ICD-10-CM | POA: Insufficient documentation

## 2024-02-10 DIAGNOSIS — O09892 Supervision of other high risk pregnancies, second trimester: Secondary | ICD-10-CM | POA: Insufficient documentation

## 2024-02-10 DIAGNOSIS — B20 Human immunodeficiency virus [HIV] disease: Secondary | ICD-10-CM

## 2024-02-10 DIAGNOSIS — Z3A33 33 weeks gestation of pregnancy: Secondary | ICD-10-CM | POA: Diagnosis not present

## 2024-02-10 DIAGNOSIS — O99891 Other specified diseases and conditions complicating pregnancy: Secondary | ICD-10-CM

## 2024-02-10 DIAGNOSIS — Z21 Asymptomatic human immunodeficiency virus [HIV] infection status: Secondary | ICD-10-CM

## 2024-02-10 DIAGNOSIS — O98713 Human immunodeficiency virus [HIV] disease complicating pregnancy, third trimester: Secondary | ICD-10-CM | POA: Diagnosis not present

## 2024-02-10 DIAGNOSIS — O0943 Supervision of pregnancy with grand multiparity, third trimester: Secondary | ICD-10-CM

## 2024-02-10 DIAGNOSIS — O98719 Human immunodeficiency virus [HIV] disease complicating pregnancy, unspecified trimester: Secondary | ICD-10-CM | POA: Insufficient documentation

## 2024-02-10 DIAGNOSIS — O358XX0 Maternal care for other (suspected) fetal abnormality and damage, not applicable or unspecified: Secondary | ICD-10-CM | POA: Insufficient documentation

## 2024-02-10 NOTE — Progress Notes (Signed)
 MFM Consult Note  Erica Walton is currently at 33 weeks and 5 days.  She has been followed due to maternal HIV, grand multiparity, and maternal obesity.    She denies any problems since her last exam and has screened negative for gestational diabetes.   She is currently treated with Biktarvy .  Her most recent HIV viral load was undetectable.  She has 6 prior vaginal deliveries.  Sonographic findings Single intrauterine pregnancy at 33w 5d.  Fetal cardiac activity:  Observed and appears normal. Presentation: Cephalic. Fetal biometry shows the estimated fetal weight of 4 pounds 15 ounces which measures at the 41st percentile. Amniotic fluid volume: Within normal limits.  AFI: 13.43 cm.  MVP: 5.37 cm. Placenta: Anterior.  She should have another HIV viral load drawn at 36 weeks.  Should her HIV viral load remain undetectable, IV zidovudine  may not be necessary if the patient is taking her antiretroviral medication.   Invasive procedures such as placement of a fetal scalp electrode are not recommended.  As the fetal growth is within normal limits, no further exams were scheduled in our office.  The patient stated that all of her questions were answered today.  A total of 20 minutes was spent counseling and coordinating the care for this patient.  Greater than 50% of the time was spent in direct face-to-face contact.

## 2024-02-15 ENCOUNTER — Encounter: Payer: Self-pay | Admitting: Obstetrics & Gynecology

## 2024-02-15 ENCOUNTER — Encounter: Admitting: Obstetrics & Gynecology

## 2024-02-15 ENCOUNTER — Ambulatory Visit: Admitting: Obstetrics & Gynecology

## 2024-02-15 VITALS — BP 115/77 | HR 91 | Wt 178.4 lb

## 2024-02-15 DIAGNOSIS — Z3A34 34 weeks gestation of pregnancy: Secondary | ICD-10-CM | POA: Diagnosis not present

## 2024-02-15 DIAGNOSIS — Z8619 Personal history of other infectious and parasitic diseases: Secondary | ICD-10-CM | POA: Diagnosis not present

## 2024-02-15 DIAGNOSIS — O0990 Supervision of high risk pregnancy, unspecified, unspecified trimester: Secondary | ICD-10-CM

## 2024-02-15 DIAGNOSIS — O98719 Human immunodeficiency virus [HIV] disease complicating pregnancy, unspecified trimester: Secondary | ICD-10-CM

## 2024-02-15 DIAGNOSIS — O99013 Anemia complicating pregnancy, third trimester: Secondary | ICD-10-CM

## 2024-02-15 MED ORDER — VALACYCLOVIR HCL 1 G PO TABS: 1000.0000 mg | ORAL_TABLET | Freq: Every day | ORAL | 4 refills | Status: DC

## 2024-02-15 NOTE — Progress Notes (Signed)
 PRENATAL VISIT NOTE  Subjective:  Erica Walton is a 28 y.o. [phone] at [redacted]w[redacted]d being seen today for ongoing prenatal care.  She is currently monitored for the following issues for this high-risk pregnancy and has Asthma affecting pregnancy in third trimester; Human immunodeficiency virus (HIV) disease (HCC); Anemia during pregnancy in third trimester; Supervision of high risk pregnancy, antepartum; Asymptomatic bacteriuria during pregnancy; Obesity affecting pregnancy, antepartum; HIV affecting pregnancy, antepartum; BMI 35.0-35.9,adult; Grand multiparity; and History of herpes genitalis on their problem list.  Patient reports no complaints.  Contractions: Irritability. Vag. Bleeding: None.  Movement: Present. Denies leaking of fluid.   The following portions of the patient's history were reviewed and updated as appropriate: allergies, current medications, past family history, past medical history, past social history, past surgical history and problem list.   Objective:    Vitals:   02/15/24 1116  BP: 115/77  Pulse: 91  Weight: 178 lb 6.4 oz (80.9 kg)    Fetal Status:  Fetal Heart Rate (bpm): 159   Movement: Present    General: Alert, oriented and cooperative. Patient is in no acute distress.  Skin: Skin is warm and dry. No rash noted.   Cardiovascular: Normal heart rate noted  Respiratory: Normal respiratory effort, no problems with respiration noted  Abdomen: Soft, gravid, appropriate for gestational age.  Pain/Pressure: Present     Pelvic: Cervical exam deferred        Extremities: Normal range of motion.  Edema: None  Mental Status: Normal mood and affect. Normal behavior. Normal judgment and thought content.   US  MFM OB FOLLOW UP Result Date: 02/10/2024 ----------------------------------------------------------------------  OBSTETRICS REPORT                       (Signed Final 02/10/2024 04:33 pm) ----------------------------------------------------------------------  Patient Info  ID #:       [id number]                          D.O.B.:  June 28, 1995 (28 yrs)(F)  Name:       Erica Walton                Visit Date: 02/10/2024 07:39 am ---------------------------------------------------------------------- Performed By  Attending:        Steffan Keys MD         Ref. Address:     [address]                                                             [address]  Performed By:     Rumaldo Sharps RDMS      [facility]  Referred By:      [facility] Correne Beagle ---------------------------------------------------------------------- Orders  #  Description                           Code        Ordered By  1  US  MFM OB FOLLOW UP                   [phone]    FREDIA FRESH ----------------------------------------------------------------------  #  Order #                     Accession #                Episode #  1  [id number]                   [id number]                 [id number] ---------------------------------------------------------------------- Indications  HIV affecting pregnancy, third trimester       O98.713  Obesity complicating pregnancy, third          O99.213  trimester  Vaginal bleeding in pregnancy, third           O46.93  trimester (RESOLVED - due to UTI)  LR female  Encounter for other antenatal screening        Z36.2  follow-up  [redacted] weeks gestation of pregnancy                Z3A.33 ---------------------------------------------------------------------- Vital Signs  BP:          111/ ---------------------------------------------------------------------- Fetal Evaluation  Num Of Fetuses:         1  Fetal Heart Rate(bpm):  143  Cardiac Activity:       Observed  Presentation:           Cephalic  Placenta:               Anterior  P. Cord Insertion:       Previously seen  Amniotic Fluid  AFI FV:      Within normal limits  AFI Sum(cm)     %Tile       Largest Pocket(cm)  13.43           44          5.37  RUQ(cm)       RLQ(cm)       LUQ(cm)        LLQ(cm)  5.37          3.32          0              4.74 ---------------------------------------------------------------------- Biometry  BPD:      83.5  mm     G. Age:  33w 4d         43  %    CI:         73.9   %    70 - 86  FL/HC:      20.3   %    19.4 - 21.8  HC:      308.5  mm     G. Age:  34w 3d         31  %    HC/AC:      1.02        0.96 - 1.11  AC:      302.4  mm     G. Age:  34w 1d         67  %    FL/BPD:     74.9   %    71 - 87  FL:       62.5  mm     G. Age:  32w 3d         11  %    FL/AC:      20.7   %    20 - 24  LV:        3.6  mm  Est. FW:    2245  gm    4 lb 15 oz      41  % ---------------------------------------------------------------------- OB History  Gravidity:    7         Term:   6        Prem:   0        SAB:   0  TOP:          0       Ectopic:  0        Living: 6 ---------------------------------------------------------------------- Gestational Age  LMP:           33w 5d        Date:  06/19/23                   EDD:   03/25/24  U/S Today:     33w 5d                                        EDD:   03/25/24  Best:          33w 5d     Det. By:  LMP  (06/19/23)          EDD:   03/25/24 ---------------------------------------------------------------------- Anatomy  Cranium:               Previously seen        Aortic Arch:            Previously seen  Cavum:                 Previously seen        Ductal Arch:            Previously seen  Ventricles:            Appears normal         Diaphragm:              Appears normal  Choroid Plexus:        Previously seen        Stomach:                Appears normal, left  sided  Cerebellum:            Previously seen        Abdomen:                 Previously seen  Posterior Fossa:       Previously seen        Abdominal Wall:         Previously seen  Face:                  Orbits and profile     Cord Vessels:           Previously seen                         previously seen  Lips:                  Previously seen        Kidneys:                Appear normal  Thoracic:              Previously seen        Bladder:                Appears normal  Heart:                 Appears normal         Spine:                  Previously seen                         (4CH, axis, and                         situs)  RVOT:                  Previously seen        Upper Extremities:      Previously seen  LVOT:                  Previously seen        Lower Extremities:      Previously seen  Other:  Fetal anatomic survey complete on prior scans. ---------------------------------------------------------------------- Cervix Uterus Adnexa  Cervix  Length:            3.7  cm.  Normal appearance by transabdominal scan  Uterus  No abnormality visualized.  Right Ovary  Not visualized.  Left Ovary  Not visualized.  Cul De Sac  No free fluid seen.  Adnexa  No abnormality visualized ---------------------------------------------------------------------- Comments  Erica Walton is currently at 33 weeks and 5 days.  She has  been followed due to maternal HIV, grand multiparity, and  maternal obesity.  She denies any problems since her last exam and has  screened negative for gestational diabetes.  She is currently treated with Biktarvy .  Her most recent HIV  viral load was undetectable.  She has 6 prior vaginal deliveries.  Sonographic findings  Single intrauterine pregnancy at 33w 5d.  Fetal cardiac activity:  Observed and appears normal.  Presentation: Cephalic.  Fetal biometry shows the estimated fetal weight of 4 pounds  15 ounces which measures at the 41st percentile.  Amniotic fluid volume: Within normal limits.  AFI: 13.43 cm.  MVP: 5.37 cm.  Placenta:  Anterior.  She should have another  HIV viral load drawn at 36 weeks.  Should her HIV viral load remain undetectable, IV zidovudine   may not be necessary if the patient is taking her antiretroviral  medication.  Invasive procedures such as placement of a fetal scalp  electrode are not recommended.  As the fetal growth is within normal limits, no further exams  were scheduled in our office.  The patient stated that all of her questions were answered  today.  A total of 20 minutes was spent counseling and coordinating  the care for this patient.  Greater than 50% of the time was  spent in direct face-to-face contact. ----------------------------------------------------------------------                   Steffan Keys, MD Electronically Signed Final Report   02/10/2024 04:33 pm ----------------------------------------------------------------------   US  MFM OB LIMITED Result Date: 02/01/2024 ----------------------------------------------------------------------  OBSTETRICS REPORT                        (Signed Final 02/01/2024 06:59 am) ---------------------------------------------------------------------- Patient Info  ID #:       [id number]                          D.O.B.:  21-Oct-1995 (27 yrs)(F)  Name:       Erica Walton                Visit Date: 02/01/2024 03:54 am ---------------------------------------------------------------------- Performed By  Attending:        Fredia Fresh MD        Ref. Address:      [address]                                                              [address]  Performed By:     Mardy Heller          [facility]  Referred By:      [facility] ---------------------------------------------------------------------- Orders  #  Description                           Code        Ordered By  1  US  MFM OB LIMITED                     [id number]    COLTER CASHION  ----------------------------------------------------------------------  #  Order #                     Accession #                Episode #  1  [id number]                   [id number]  [id number] ---------------------------------------------------------------------- Indications  Vaginal bleeding in pregnancy, third trimester  O46.93  [redacted] weeks gestation of pregnancy                 Z3A.32 ---------------------------------------------------------------------- Fetal Evaluation  Num Of Fetuses:          1  Fetal Heart Rate(bpm):   149  Cardiac Activity:        Observed  Presentation:            Cephalic  Placenta:                Anterior  P. Cord Insertion:       Visualized, central  Amniotic Fluid  AFI FV:      Within normal limits  AFI Sum(cm)     %Tile       Largest Pocket(cm)  13.5            43          4.5  RUQ(cm)       RLQ(cm)       LUQ(cm)        LLQ(cm)  4.5           1.4           4              3.6  Comment:    Stomach, bladder, diaphragm, and kidneys noted. No placental              abruption or previa identified. ---------------------------------------------------------------------- OB History  Gravidity:    7         Term:   6        Prem:   0        SAB:   0  TOP:          0       Ectopic:  0        Living: 6 ---------------------------------------------------------------------- Gestational Age  LMP:           32w 3d        Date:  06/19/23                   EDD:   03/25/24  Best:          bobbye 3d     Det. By:  LMP  (06/19/23)          EDD:   03/25/24 ---------------------------------------------------------------------- Cervix Uterus Adnexa  Cervix  Not visualized (advanced GA >24wks)  Uterus  No abnormality visualized.  Right Ovary  Not visualized.  Left Ovary  Not visualized.  Adnexa  No abnormality visualized ---------------------------------------------------------------------- Impression  Patient presented to the MAU with vaginal bleeding.  Normal amniotic fluid with good fetal activity is  seen. Placenta  appears normal with no evidence of placental abruption or  placenta previa. Ultrasound has limitations in diagnosing in  placental abruption. ----------------------------------------------------------------------                  Fredia Fresh, MD Electronically Signed Final Report   02/01/2024 06:59 am ----------------------------------------------------------------------   DG Chest 2 View Result Date: 01/29/2024 CLINICAL DATA:  Cough and vomiting x4 days. EXAM: CHEST - 2 VIEW COMPARISON:  May 13, 2023 FINDINGS: The heart size and mediastinal contours are within normal limits. Mild diffusely increased lung markings are noted. There is no evidence of acute infiltrate, pleural effusion or pneumothorax. The visualized skeletal structures are unremarkable. IMPRESSION: No active cardiopulmonary disease. Electronically Signed  By: Suzen Dials M.D.   On: 01/29/2024 15:11    Assessment and Plan:  Pregnancy: [id number] at [redacted]w[redacted]d 1. HIV affecting pregnancy, antepartum (Primary) Undetectable VL, on medications. Will re-check viral load  and CD4 count at 36 weeks.  2. History of herpes genitalis Suppression therapy ordered - valACYclovir  (VALTREX ) 1000 MG tablet; Take 1 tablet (1,000 mg total) by mouth daily.  Dispense: 30 tablet; Refill: 4  3. Anemia during pregnancy in third trimester Has received iron infusions, will check CBC and ferritin next visit  4. [redacted] weeks gestation of pregnancy 5. Supervision of high risk pregnancy, antepartum No other concerns. Pelvic cultures, RSV next visit  Preterm labor symptoms and general obstetric precautions including but not limited to vaginal bleeding, contractions, leaking of fluid and fetal movement were reviewed in detail with the patient. Please refer to After Visit Summary for other counseling recommendations.   Return in about 2 weeks (around 02/29/2024) for OFFICE OB VISIT (MD only).  Future Appointments  Date Time Provider  Department Center  02/29/2024 10:55 AM Gerik Coberly, Gloris LABOR, MD [facility]  03/07/2024 10:55 AM Aidian Salomon, Gloris LABOR, MD [facility]  03/14/2024 10:35 AM Constant, Winton, MD [facility]    Gloris Hugger, MD

## 2024-02-15 NOTE — Patient Instructions (Signed)

## 2024-02-26 LAB — GENECONNECT MOLECULAR SCREEN: Genetic Analysis Overall Interpretation: NEGATIVE

## 2024-02-29 ENCOUNTER — Encounter: Payer: Self-pay | Admitting: Obstetrics & Gynecology

## 2024-02-29 ENCOUNTER — Other Ambulatory Visit (HOSPITAL_COMMUNITY)
Admission: RE | Admit: 2024-02-29 | Discharge: 2024-02-29 | Disposition: A | Discharge status: Home / Self Care | Source: Ambulatory Visit | Attending: Obstetrics & Gynecology | Admitting: Obstetrics & Gynecology

## 2024-02-29 ENCOUNTER — Ambulatory Visit (INDEPENDENT_AMBULATORY_CARE_PROVIDER_SITE_OTHER): Admitting: Obstetrics & Gynecology

## 2024-02-29 VITALS — BP 104/69 | HR 87 | Wt 176.1 lb

## 2024-02-29 DIAGNOSIS — O0990 Supervision of high risk pregnancy, unspecified, unspecified trimester: Secondary | ICD-10-CM | POA: Insufficient documentation

## 2024-02-29 DIAGNOSIS — Z2911 Encounter for prophylactic immunotherapy for respiratory syncytial virus (RSV): Secondary | ICD-10-CM

## 2024-02-29 DIAGNOSIS — O98719 Human immunodeficiency virus [HIV] disease complicating pregnancy, unspecified trimester: Secondary | ICD-10-CM

## 2024-02-29 DIAGNOSIS — Z23 Encounter for immunization: Secondary | ICD-10-CM | POA: Diagnosis not present

## 2024-02-29 DIAGNOSIS — Z3A36 36 weeks gestation of pregnancy: Secondary | ICD-10-CM | POA: Insufficient documentation

## 2024-02-29 DIAGNOSIS — Z8619 Personal history of other infectious and parasitic diseases: Secondary | ICD-10-CM

## 2024-02-29 DIAGNOSIS — Z641 Problems related to multiparity: Secondary | ICD-10-CM | POA: Diagnosis not present

## 2024-02-29 DIAGNOSIS — O99013 Anemia complicating pregnancy, third trimester: Secondary | ICD-10-CM

## 2024-02-29 NOTE — Progress Notes (Signed)
 PRENATAL VISIT NOTE  Subjective:  Erica Walton is a 28 y.o. [phone] at [redacted]w[redacted]d being seen today for ongoing prenatal care.  She is currently monitored for the following issues for this high-risk pregnancy and has Asthma affecting pregnancy in third trimester; Human immunodeficiency virus (HIV) disease (HCC); Anemia during pregnancy in third trimester; Supervision of high risk pregnancy, antepartum; Asymptomatic bacteriuria during pregnancy; Obesity affecting pregnancy, antepartum; HIV affecting pregnancy, antepartum; BMI 35.0-35.9,adult; Grand multiparity; and History of herpes genitalis on their problem list.  Patient reports no complaints.  Contractions: Not present. Vag. Bleeding: None.  Movement: Present. Denies leaking of fluid.   The following portions of the patient's history were reviewed and updated as appropriate: allergies, current medications, past family history, past medical history, past social history, past surgical history and problem list.   Objective:   Chaperone Neva Hyler, CMA   Vitals:   02/29/24 1058  BP: 104/69  Pulse: 87  Weight: 176 lb 2 oz (79.9 kg)    Fetal Status:  Fetal Heart Rate (bpm): 163 Fundal Height: 37 cm Movement: Present Presentation: Vertex  General: Alert, oriented and cooperative. Patient is in no acute distress.  Skin: Skin is warm and dry. No rash noted.   Cardiovascular: Normal heart rate noted  Respiratory: Normal respiratory effort, no problems with respiration noted  Abdomen: Soft, gravid, appropriate for gestational age.  Pain/Pressure: Present     Pelvic: Cervical exam performed in the presence of a chaperone Dilation: 1 Effacement (%): Thick Station: -3; cultures obtained.  Extremities: Normal range of motion.  Edema: None  Mental Status: Normal mood and affect. Normal behavior. Normal judgment and thought content.      02/29/2024   11:03 AM 01/04/2024    8:46 AM 09/15/2023   10:25 AM  Depression screen PHQ 2/9  Decreased  Interest 0 0 0  Down, Depressed, Hopeless 0 0 0  PHQ - 2 Score 0 0 0  Altered sleeping 0 0 0  Tired, decreased energy 0 0 0  Change in appetite 0 0 0  Feeling bad or failure about yourself  0 0 0  Trouble concentrating 0 0 0  Moving slowly or fidgety/restless 0 0 0  Suicidal thoughts  0 0  PHQ-9 Score 0 0  0   Difficult doing work/chores   Not difficult at all     Data saved with a previous flowsheet row definition        02/29/2024   11:02 AM 01/04/2024    8:46 AM 09/15/2023   10:25 AM 07/13/2023    3:35 PM  GAD 7 : Generalized Anxiety Score  Nervous, Anxious, on Edge 0 0 0 0  Control/stop worrying 0 0 0 0  Worry too much - different things 0 0 0 0  Trouble relaxing 0 0 0 0  Restless 0 0 0 0  Easily annoyed or irritable 0 0 0 0  Afraid - awful might happen 0 0 0 0  Total GAD 7 Score 0 0 0 0  Anxiety Difficulty   Not difficult at all Not difficult at all   US  MFM OB FOLLOW UP Result Date: 02/10/2024 ----------------------------------------------------------------------  OBSTETRICS REPORT                       (Signed Final 02/10/2024 04:33 pm) ---------------------------------------------------------------------- Patient Info  ID #:       [id number]  D.O.B.:  07-20-1995 (28 yrs)(F)  Name:       Erica Walton                Visit Date: 02/10/2024 07:39 am ---------------------------------------------------------------------- Performed By  Attending:        Steffan Keys MD         Ref. Address:     [address]                                                             [address]  Performed By:     Rumaldo Sharps RDMS      [facility]  Referred By:      [facility]  ---------------------------------------------------------------------- Orders  #  Description                           Code        Ordered By  1  US  MFM OB FOLLOW UP                   23183.98    Erica Walton ----------------------------------------------------------------------  #  Order #                     Accession #                Episode #  1  [id number]                   [id number]                 [id number] ---------------------------------------------------------------------- Indications  HIV affecting pregnancy, third trimester       O98.713  Obesity complicating pregnancy, third          O99.213  trimester  Vaginal bleeding in pregnancy, third           O46.93  trimester (RESOLVED - due to UTI)  LR female  Encounter for other antenatal screening        Z36.2  follow-up  [redacted] weeks gestation of pregnancy  Z3A.33 ---------------------------------------------------------------------- Vital Signs  BP:          111/ ---------------------------------------------------------------------- Fetal Evaluation  Num Of Fetuses:         1  Fetal Heart Rate(bpm):  143  Cardiac Activity:       Observed  Presentation:           Cephalic  Placenta:               Anterior  P. Cord Insertion:      Previously seen  Amniotic Fluid  AFI FV:      Within normal limits  AFI Sum(cm)     %Tile       Largest Pocket(cm)  13.43           44          5.37  RUQ(cm)       RLQ(cm)       LUQ(cm)        LLQ(cm)  5.37          3.32          0              4.74 ---------------------------------------------------------------------- Biometry  BPD:      83.5  mm     G. Age:  33w 4d         43  %    CI:         73.9   %    70 - 86                                                          FL/HC:      20.3   %    19.4 - 21.8  HC:      308.5  mm     G. Age:  34w 3d         31  %    HC/AC:      1.02        0.96 - 1.11  AC:      302.4  mm     G. Age:  34w 1d         67  %    FL/BPD:     74.9   %    71 - 87  FL:       62.5  mm     G. Age:  32w  3d         11  %    FL/AC:      20.7   %    20 - 24  LV:        3.6  mm  Est. FW:    2245  gm    4 lb 15 oz      41  % ---------------------------------------------------------------------- OB History  Gravidity:    7         Term:   6        Prem:   0        SAB:   0  TOP:          0       Ectopic:  0        Living: 6 ---------------------------------------------------------------------- Gestational Age  LMP:  33w 5d        Date:  06/19/23                   EDD:   03/25/24  U/S Today:     33w 5d                                        EDD:   03/25/24  Best:          33w 5d     Det. By:  LMP  (06/19/23)          EDD:   03/25/24 ---------------------------------------------------------------------- Anatomy  Cranium:               Previously seen        Aortic Arch:            Previously seen  Cavum:                 Previously seen        Ductal Arch:            Previously seen  Ventricles:            Appears normal         Diaphragm:              Appears normal  Choroid Plexus:        Previously seen        Stomach:                Appears normal, left                                                                        sided  Cerebellum:            Previously seen        Abdomen:                Previously seen  Posterior Fossa:       Previously seen        Abdominal Wall:         Previously seen  Face:                  Orbits and profile     Cord Vessels:           Previously seen                         previously seen  Lips:                  Previously seen        Kidneys:                Appear normal  Thoracic:              Previously seen        Bladder:                Appears normal  Heart:                 Appears normal  Spine:                  Previously seen                         (4CH, axis, and                         situs)  RVOT:                  Previously seen        Upper Extremities:      Previously seen  LVOT:                  Previously seen        Lower Extremities:      Previously  seen  Other:  Fetal anatomic survey complete on prior scans. ---------------------------------------------------------------------- Cervix Uterus Adnexa  Cervix  Length:            3.7  cm.  Normal appearance by transabdominal scan  Uterus  No abnormality visualized.  Right Ovary  Not visualized.  Left Ovary  Not visualized.  Cul De Sac  No free fluid seen.  Adnexa  No abnormality visualized ---------------------------------------------------------------------- Comments  Cindra Shank is currently at 33 weeks and 5 days.  She has  been followed due to maternal HIV, grand multiparity, and  maternal obesity.  She denies any problems since her last exam and has  screened negative for gestational diabetes.  She is currently treated with Biktarvy .  Her most recent HIV  viral load was undetectable.  She has 6 prior vaginal deliveries.  Sonographic findings  Single intrauterine pregnancy at 33w 5d.  Fetal cardiac activity:  Observed and appears normal.  Presentation: Cephalic.  Fetal biometry shows the estimated fetal weight of 4 pounds  15 ounces which measures at the 41st percentile.  Amniotic fluid volume: Within normal limits.  AFI: 13.43 cm.  MVP: 5.37 cm.  Placenta: Anterior.  She should have another HIV viral load drawn at 36 weeks.  Should her HIV viral load remain undetectable, IV zidovudine   may not be necessary if the patient is taking her antiretroviral  medication.  Invasive procedures such as placement of a fetal scalp  electrode are not recommended.  As the fetal growth is within normal limits, no further exams  were scheduled in our office.  The patient stated that all of her questions were answered  today.  A total of 20 minutes was spent counseling and coordinating  the care for this patient.  Greater than 50% of the time was  spent in direct face-to-face contact. ----------------------------------------------------------------------                   Steffan Keys, MD Electronically Signed Final Report    02/10/2024 04:33 pm ----------------------------------------------------------------------   US  MFM OB LIMITED Result Date: 02/01/2024 ----------------------------------------------------------------------  OBSTETRICS REPORT                        (Signed Final 02/01/2024 06:59 am) ---------------------------------------------------------------------- Patient Info  ID #:       [id number]                          D.O.B.:  1996/01/21 (27 yrs)(F)  Name:       CINDRA CHRISTELLA SHANK  Visit Date: 02/01/2024 03:54 am ---------------------------------------------------------------------- Performed By  Attending:        Fredia Fresh MD        Ref. Address:      [address]                                                              [address]  Performed By:     Mardy Heller          [facility]  Referred By:      [facility] ---------------------------------------------------------------------- Orders  #  Description                           Code        Ordered By  1  US  MFM OB LIMITED                     [id number]    COLTER CASHION ----------------------------------------------------------------------  #  Order #                     Accession #                Episode #  1  [id number]                   [id number]                 [id number] ---------------------------------------------------------------------- Indications  Vaginal bleeding in pregnancy, third trimester  O46.93  [redacted] weeks gestation of pregnancy                 Z3A.32 ---------------------------------------------------------------------- Fetal Evaluation  Num Of Fetuses:          1  Fetal Heart Rate(bpm):   149  Cardiac Activity:        Observed  Presentation:            Cephalic  Placenta:                Anterior  P. Cord Insertion:       Visualized, central  Amniotic Fluid  AFI FV:      Within normal limits  AFI Sum(cm)     %Tile       Largest  Pocket(cm)  13.5            43          4.5  RUQ(cm)       RLQ(cm)       LUQ(cm)        LLQ(cm)  4.5           1.4           4              3.6  Comment:    Stomach, bladder, diaphragm, and kidneys noted.  No placental              abruption or previa identified. ---------------------------------------------------------------------- OB History  Gravidity:    7         Term:   6        Prem:   0        SAB:   0  TOP:          0       Ectopic:  0        Living: 6 ---------------------------------------------------------------------- Gestational Age  LMP:           32w 3d        Date:  06/19/23                   EDD:   03/25/24  Best:          bobbye 3d     Det. By:  LMP  (06/19/23)          EDD:   03/25/24 ---------------------------------------------------------------------- Cervix Uterus Adnexa  Cervix  Not visualized (advanced GA >24wks)  Uterus  No abnormality visualized.  Right Ovary  Not visualized.  Left Ovary  Not visualized.  Adnexa  No abnormality visualized ---------------------------------------------------------------------- Impression  Patient presented to the MAU with vaginal bleeding.  Normal amniotic fluid with good fetal activity is seen. Placenta  appears normal with no evidence of placental abruption or  placenta previa. Ultrasound has limitations in diagnosing in  placental abruption. ----------------------------------------------------------------------                  Erica Fresh, MD Electronically Signed Final Report   02/01/2024 06:59 am ----------------------------------------------------------------------     Assessment and Plan:  Pregnancy: [id number] at [redacted]w[redacted]d 1. Anemia during pregnancy in third trimester (Primary) Will follow up CBC and ferritin levels today.   - CBC - Ferritin  2. HIV affecting pregnancy, antepartum Negative viral load, on Biktarvy . As long as viral load is negative, no contraindication to vaginal dleivery. - HIV 1 RNA quant-no reflex-bld - T-helper cells (CD4)  count  3. History of herpes genitalis On Valtrex  suppression  4. Grand multiparity Elevated risk of PPH, precautionary measures will be taken intra and postpartum.  5. Need for RSV vaccination - Respiratory syncytial virus vaccine, preF, subunit, bivalent,(Abrysvo) given today  6. [redacted] weeks gestation of pregnancy 7. Supervision of high risk pregnancy, antepartum Pelvic cultures done, will follow up results and manage accordingly. - Cervicovaginal ancillary only - Strep Gp B Culture+Rflx Preterm labor symptoms and general obstetric precautions including but not limited to vaginal bleeding, contractions, leaking of fluid and fetal movement were reviewed in detail with the patient. Please refer to After Visit Summary for other counseling recommendations.   Return in about 1 week (around 03/07/2024) for OFFICE OB VISIT (MD only).  Future Appointments  Date Time Provider Department Center  03/07/2024 10:55 AM Hoover Grewe, Gloris LABOR, MD [facility]  03/14/2024 10:35 AM Constant, Winton, MD [facility]    Gloris Hugger, MD

## 2024-02-29 NOTE — Patient Instructions (Signed)

## 2024-03-01 ENCOUNTER — Ambulatory Visit: Payer: Self-pay | Admitting: Obstetrics & Gynecology

## 2024-03-01 DIAGNOSIS — Z789 Other specified health status: Secondary | ICD-10-CM

## 2024-03-01 DIAGNOSIS — B20 Human immunodeficiency virus [HIV] disease: Secondary | ICD-10-CM

## 2024-03-01 DIAGNOSIS — O98719 Human immunodeficiency virus [HIV] disease complicating pregnancy, unspecified trimester: Secondary | ICD-10-CM

## 2024-03-01 LAB — T-HELPER CELLS (CD4) COUNT (NOT AT ARMC)
% CD 4 Pos. Lymph.: 39.3 % (ref 30.8–58.5)
Absolute CD 4 Helper: 550 /uL (ref 359–1519)
Basophils Absolute: 0 x10E3/uL (ref 0.0–0.2)
Basos: 0 %
EOS (ABSOLUTE): 0.3 x10E3/uL (ref 0.0–0.4)
Eos: 4 %
Hematocrit: 36 % (ref 34.0–46.6)
Hemoglobin: 11.4 g/dL (ref 11.1–15.9)
Immature Grans (Abs): 0.1 x10E3/uL (ref 0.0–0.1)
Immature Granulocytes: 1 %
Lymphocytes Absolute: 1.4 x10E3/uL (ref 0.7–3.1)
Lymphs: 17 %
MCH: 27.1 pg (ref 26.6–33.0)
MCHC: 31.7 g/dL (ref 31.5–35.7)
MCV: 86 fL (ref 79–97)
Monocytes Absolute: 0.5 x10E3/uL (ref 0.1–0.9)
Monocytes: 7 %
Neutrophils Absolute: 5.5 x10E3/uL (ref 1.4–7.0)
Neutrophils: 71 %
Platelets: 198 x10E3/uL (ref 150–450)
RBC: 4.21 x10E6/uL (ref 3.77–5.28)
RDW: 16.3 % — ABNORMAL HIGH (ref 11.7–15.4)
WBC: 7.8 x10E3/uL (ref 3.4–10.8)

## 2024-03-01 LAB — HIV-1 RNA QUANT-NO REFLEX-BLD
HIV-1 RNA Viral Load Log: 2.591 {Log_copies}/mL
HIV-1 RNA Viral Load: 390 {copies}/mL

## 2024-03-01 LAB — FERRITIN: Ferritin: 75 ng/mL (ref 15–150)

## 2024-03-01 LAB — CERVICOVAGINAL ANCILLARY ONLY
Chlamydia: NEGATIVE
Comment: NEGATIVE
Comment: NORMAL
Neisseria Gonorrhea: NEGATIVE

## 2024-03-01 NOTE — Addendum Note (Signed)
 Addended by: HERCHEL GRUMET A on: 03/01/2024 01:47 PM   Modules accepted: Orders

## 2024-03-01 NOTE — Progress Notes (Signed)
 Addendum Patient was scheduled for induction of labor on March 19, 2024 at [redacted]w[redacted]d. She was informed. Admission orders signed and held. Will continue routine prenatal care until then.   Gloris Hugger, MD

## 2024-03-02 NOTE — Addendum Note (Signed)
 Addended by: MELVENIA COREAN SAILOR on: 03/02/2024 12:03 PM   Modules accepted: Orders

## 2024-03-02 NOTE — Progress Notes (Signed)
 Low viral load level noted, normal CD4 count.  Given that it is less than 1000 RNA/ml, patient is still able to have a vaginal delivery as planned, induction of labor scheduled on 11/30 25. Given that she had undetected viral loads for the last 3 years, and now has a reportable viral load, this result was also sent to patient's ID providers to see if there needs to be any change in her medication (she is currently on Biktarvy  50-200-25 mg daily).  Also, want to know if Zidovudine  prophylaxis is now indicated in labor (I would assume so, but wanted to clarify).  Also is there any concern this viral load will increase significantly (>1000) in the next 2 weeks? Will await their recommendations and appreciate their expertise and input.  Gloris Hugger, MD

## 2024-03-02 NOTE — Telephone Encounter (Signed)
 Called [facility] to discuss result and schedule lab appointment at [facility], no answer and unable to leave message.   Kyshawn Teal, BSN, RN

## 2024-03-04 LAB — STREP GP B CULTURE+RFLX: Strep Gp B Culture+Rflx: NEGATIVE

## 2024-03-06 ENCOUNTER — Encounter (HOSPITAL_COMMUNITY): Payer: Self-pay | Admitting: *Deleted

## 2024-03-06 ENCOUNTER — Telehealth (HOSPITAL_COMMUNITY): Payer: Self-pay | Admitting: *Deleted

## 2024-03-06 NOTE — Telephone Encounter (Signed)
 Preadmission screen

## 2024-03-07 ENCOUNTER — Encounter: Payer: Self-pay | Admitting: Obstetrics & Gynecology

## 2024-03-07 ENCOUNTER — Telehealth (INDEPENDENT_AMBULATORY_CARE_PROVIDER_SITE_OTHER): Admitting: Obstetrics & Gynecology

## 2024-03-07 DIAGNOSIS — O0990 Supervision of high risk pregnancy, unspecified, unspecified trimester: Secondary | ICD-10-CM

## 2024-03-07 DIAGNOSIS — O09293 Supervision of pregnancy with other poor reproductive or obstetric history, third trimester: Secondary | ICD-10-CM

## 2024-03-07 DIAGNOSIS — O0943 Supervision of pregnancy with grand multiparity, third trimester: Secondary | ICD-10-CM | POA: Diagnosis not present

## 2024-03-07 DIAGNOSIS — O98719 Human immunodeficiency virus [HIV] disease complicating pregnancy, unspecified trimester: Secondary | ICD-10-CM

## 2024-03-07 DIAGNOSIS — Z8619 Personal history of other infectious and parasitic diseases: Secondary | ICD-10-CM

## 2024-03-07 DIAGNOSIS — O98713 Human immunodeficiency virus [HIV] disease complicating pregnancy, third trimester: Secondary | ICD-10-CM | POA: Diagnosis not present

## 2024-03-07 DIAGNOSIS — Z641 Problems related to multiparity: Secondary | ICD-10-CM

## 2024-03-07 DIAGNOSIS — B20 Human immunodeficiency virus [HIV] disease: Secondary | ICD-10-CM | POA: Diagnosis not present

## 2024-03-07 DIAGNOSIS — Z3A37 37 weeks gestation of pregnancy: Secondary | ICD-10-CM

## 2024-03-07 NOTE — Progress Notes (Signed)
 OBSTETRICS PRENATAL VIRTUAL VISIT ENCOUNTER NOTE  Provider location: Center for [facility] at [facility]   Patient location: Home (was driving as a passenger towards home)  I connected with Erica Walton on 03/07/24 at 11:15 AM EST by MyChart Video Encounter and verified that I am speaking with the correct person using two identifiers. I discussed the limitations, risks, security and privacy concerns of performing an evaluation and management service virtually and the availability of in person appointments. I also discussed with the patient that there may be a patient responsible charge related to this service. The patient expressed understanding and agreed to proceed. Subjective:  Erica Walton is a 28 y.o. [phone] at [redacted]w[redacted]d being seen today for ongoing prenatal care.  She is currently monitored for the following issues for this high-risk pregnancy and has Asthma affecting pregnancy in third trimester; Human immunodeficiency virus (HIV) disease (HCC); Anemia during pregnancy in third trimester; Supervision of high risk pregnancy, antepartum; Asymptomatic bacteriuria during pregnancy; Obesity affecting pregnancy, antepartum; HIV affecting pregnancy, antepartum; BMI 35.0-35.9,adult; Grand multiparity; and History of herpes genitalis on their problem list.  Patient reports no complaints.  Contractions: Irritability. Vag. Bleeding: None.  Movement: Present. Denies any leaking of fluid.   The following portions of the patient's history were reviewed and updated as appropriate: allergies, current medications, past family history, past medical history, past social history, past surgical history and problem list.   Objective:  There were no vitals filed for this visit.  Fetal Status:      Movement: Present    General: Alert, oriented and cooperative. Patient is in no acute distress.  Respiratory: Normal respiratory effort, no problems with respiration noted  Mental Status: Normal  mood and affect. Normal behavior. Normal judgment and thought content.  Rest of physical exam deferred due to type of encounter  Imaging: US  MFM OB FOLLOW UP Result Date: 02/10/2024 ----------------------------------------------------------------------  OBSTETRICS REPORT                       (Signed Final 02/10/2024 04:33 pm) ---------------------------------------------------------------------- Patient Info  ID #:       [id number]                          D.O.B.:  1996-02-24 (28 yrs)(F)  Name:       Erica Walton                Visit Date: 02/10/2024 07:39 am ---------------------------------------------------------------------- Performed By  Attending:        Steffan Keys MD         Ref. Address:     [address]                                                             [address]  Performed By:     Rumaldo Sharps RDMS      [facility]  Referred By:      [facility] ---------------------------------------------------------------------- Orders  #  Description                           Code        Ordered By  1  US  MFM OB FOLLOW UP                   23183.98    FREDIA FRESH ----------------------------------------------------------------------  #  Order #                     Accession #                Episode #  1  [id number]                   [id number]                 [id number] ---------------------------------------------------------------------- Indications  HIV affecting pregnancy, third trimester       O98.713  Obesity complicating pregnancy, third          O99.213  trimester  Vaginal bleeding in pregnancy, third           O46.93  trimester (RESOLVED - due to UTI)  LR female  Encounter for other antenatal screening        Z36.2  follow-up  [redacted] weeks gestation of  pregnancy                Z3A.33 ---------------------------------------------------------------------- Vital Signs  BP:          111/ ---------------------------------------------------------------------- Fetal Evaluation  Num Of Fetuses:         1  Fetal Heart Rate(bpm):  143  Cardiac Activity:       Observed  Presentation:           Cephalic  Placenta:               Anterior  P. Cord Insertion:      Previously seen  Amniotic Fluid  AFI FV:      Within normal limits  AFI Sum(cm)     %Tile       Largest Pocket(cm)  13.43           44          5.37  RUQ(cm)       RLQ(cm)       LUQ(cm)        LLQ(cm)  5.37          3.32          0              4.74 ---------------------------------------------------------------------- Biometry  BPD:  83.5  mm     G. Age:  33w 4d         43  %    CI:         73.9   %    70 - 86                                                          FL/HC:      20.3   %    19.4 - 21.8  HC:      308.5  mm     G. Age:  34w 3d         31  %    HC/AC:      1.02        0.96 - 1.11  AC:      302.4  mm     G. Age:  34w 1d         67  %    FL/BPD:     74.9   %    71 - 87  FL:       62.5  mm     G. Age:  32w 3d         11  %    FL/AC:      20.7   %    20 - 24  LV:        3.6  mm  Est. FW:    2245  gm    4 lb 15 oz      41  % ---------------------------------------------------------------------- OB History  Gravidity:    7         Term:   6        Prem:   0        SAB:   0  TOP:          0       Ectopic:  0        Living: 6 ---------------------------------------------------------------------- Gestational Age  LMP:           33w 5d        Date:  06/19/23                   EDD:   03/25/24  U/S Today:     33w 5d                                        EDD:   03/25/24  Best:          33w 5d     Det. By:  LMP  (06/19/23)          EDD:   03/25/24 ---------------------------------------------------------------------- Anatomy  Cranium:               Previously seen        Aortic Arch:            Previously seen  Cavum:                  Previously seen        Ductal Arch:            Previously seen  Ventricles:  Appears normal         Diaphragm:              Appears normal  Choroid Plexus:        Previously seen        Stomach:                Appears normal, left                                                                        sided  Cerebellum:            Previously seen        Abdomen:                Previously seen  Posterior Fossa:       Previously seen        Abdominal Wall:         Previously seen  Face:                  Orbits and profile     Cord Vessels:           Previously seen                         previously seen  Lips:                  Previously seen        Kidneys:                Appear normal  Thoracic:              Previously seen        Bladder:                Appears normal  Heart:                 Appears normal         Spine:                  Previously seen                         (4CH, axis, and                         situs)  RVOT:                  Previously seen        Upper Extremities:      Previously seen  LVOT:                  Previously seen        Lower Extremities:      Previously seen  Other:  Fetal anatomic survey complete on prior scans. ---------------------------------------------------------------------- Cervix Uterus Adnexa  Cervix  Length:            3.7  cm.  Normal appearance by transabdominal scan  Uterus  No abnormality visualized.  Right Ovary  Not visualized.  Left Ovary  Not visualized.  Cul De Sac  No free fluid seen.  Adnexa  No abnormality visualized ---------------------------------------------------------------------- Comments  Erica Walton is currently at 33 weeks and 5 days.  She has  been followed due to maternal HIV, grand multiparity, and  maternal obesity.  She denies any problems since her last exam and has  screened negative for gestational diabetes.  She is currently treated with Biktarvy .  Her most recent HIV  viral load was undetectable.  She has 6  prior vaginal deliveries.  Sonographic findings  Single intrauterine pregnancy at 33w 5d.  Fetal cardiac activity:  Observed and appears normal.  Presentation: Cephalic.  Fetal biometry shows the estimated fetal weight of 4 pounds  15 ounces which measures at the 41st percentile.  Amniotic fluid volume: Within normal limits.  AFI: 13.43 cm.  MVP: 5.37 cm.  Placenta: Anterior.  She should have another HIV viral load drawn at 36 weeks.  Should her HIV viral load remain undetectable, IV zidovudine   may not be necessary if the patient is taking her antiretroviral  medication.  Invasive procedures such as placement of a fetal scalp  electrode are not recommended.  As the fetal growth is within normal limits, no further exams  were scheduled in our office.  The patient stated that all of her questions were answered  today.  A total of 20 minutes was spent counseling and coordinating  the care for this patient.  Greater than 50% of the time was  spent in direct face-to-face contact. ----------------------------------------------------------------------                   Steffan Keys, MD Electronically Signed Final Report   02/10/2024 04:33 pm ----------------------------------------------------------------------    Assessment and Plan:  Pregnancy: [id number] at [redacted]w[redacted]d 1. HIV affecting pregnancy, antepartum (Primary) Last viral load on 02/29/24 was 390, normal CD4 count.  ID providers wanted this re-drawm at their facility as she has been undetectable for years, patient has not had a chance to go there.  Still can have a vaginal delivery, zidovudine  prophylaxis to be given during labor.  IOL already scheduled 03/19/24  2. History of herpes genitalis On Valtrex  suppression  3. Grand multiparity Increased risk of PPH, this will be prepared for.  4. [redacted] weeks gestation of pregnancy 5. Supervision of high risk pregnancy, antepartum Negative pelvic cultures last week.  Labor symptoms and general obstetric precautions  including but not limited to vaginal bleeding, contractions, leaking of fluid and fetal movement were reviewed in detail with the patient. I discussed the assessment and treatment plan with the patient. The patient was provided an opportunity to ask questions and all were answered. The patient agreed with the plan and demonstrated an understanding of the instructions. The patient was advised to call back or seek an in-person office evaluation/go to MAU at [facility] for any urgent or concerning symptoms. Please refer to After Visit Summary for other counseling recommendations.   I provided 10 minutes of face-to-face time during this encounter.  Return in about 1 week (around 03/14/2024) for OFFICE OB VISIT (MD only).  Future Appointments  Date Time Provider Department Center  03/14/2024 10:35 AM Constant, Winton, MD [facility]  03/19/2024  6:45 AM [facility] SCHED ROOM [facility] None    Gloris Hugger, MD Center for [facility], [facility]

## 2024-03-07 NOTE — Patient Instructions (Signed)

## 2024-03-14 ENCOUNTER — Encounter: Payer: Self-pay | Admitting: Obstetrics and Gynecology

## 2024-03-14 ENCOUNTER — Ambulatory Visit (INDEPENDENT_AMBULATORY_CARE_PROVIDER_SITE_OTHER): Admitting: Obstetrics and Gynecology

## 2024-03-14 VITALS — BP 128/75 | HR 81 | Wt 177.5 lb

## 2024-03-14 DIAGNOSIS — Z8619 Personal history of other infectious and parasitic diseases: Secondary | ICD-10-CM

## 2024-03-14 DIAGNOSIS — O99210 Obesity complicating pregnancy, unspecified trimester: Secondary | ICD-10-CM | POA: Diagnosis not present

## 2024-03-14 DIAGNOSIS — O99013 Anemia complicating pregnancy, third trimester: Secondary | ICD-10-CM | POA: Diagnosis not present

## 2024-03-14 DIAGNOSIS — Z3A38 38 weeks gestation of pregnancy: Secondary | ICD-10-CM | POA: Diagnosis not present

## 2024-03-14 DIAGNOSIS — O0990 Supervision of high risk pregnancy, unspecified, unspecified trimester: Secondary | ICD-10-CM | POA: Diagnosis not present

## 2024-03-14 DIAGNOSIS — Z641 Problems related to multiparity: Secondary | ICD-10-CM | POA: Diagnosis not present

## 2024-03-14 DIAGNOSIS — O98719 Human immunodeficiency virus [HIV] disease complicating pregnancy, unspecified trimester: Secondary | ICD-10-CM | POA: Diagnosis not present

## 2024-03-14 NOTE — Progress Notes (Signed)
 PRENATAL VISIT NOTE  Subjective:  Erica Walton is a 28 y.o. [phone] at [redacted]w[redacted]d being seen today for ongoing prenatal care.  She is currently monitored for the following issues for this high-risk pregnancy and has Asthma affecting pregnancy in third trimester; Human immunodeficiency virus (HIV) disease (HCC); Anemia during pregnancy in third trimester; Supervision of high risk pregnancy, antepartum; Asymptomatic bacteriuria during pregnancy; Obesity affecting pregnancy, antepartum; HIV affecting pregnancy, antepartum; BMI 35.0-35.9,adult; Grand multiparity; and History of herpes genitalis on their problem list.  Patient reports no complaints.  Contractions: Not present. Vag. Bleeding: None.  Movement: Present. Denies leaking of fluid.   The following portions of the patient's history were reviewed and updated as appropriate: allergies, current medications, past family history, past medical history, past social history, past surgical history and problem list.   Objective:   Vitals:   03/14/24 1043  BP: 128/75  Pulse: 81  Weight: 177 lb 8 oz (80.5 kg)    Fetal Status:  Fetal Heart Rate (bpm): 140   Movement: Present    General: Alert, oriented and cooperative. Patient is in no acute distress.  Skin: Skin is warm and dry. No rash noted.   Cardiovascular: Normal heart rate noted  Respiratory: Normal respiratory effort, no problems with respiration noted  Abdomen: Soft, gravid, appropriate for gestational age.  Pain/Pressure: Absent     Pelvic: Cervical exam deferred        Extremities: Normal range of motion.     Mental Status: Normal mood and affect. Normal behavior. Normal judgment and thought content.      02/29/2024   11:03 AM 01/04/2024    8:46 AM 09/15/2023   10:25 AM  Depression screen PHQ 2/9  Decreased Interest 0 0 0  Down, Depressed, Hopeless 0 0 0  PHQ - 2 Score 0 0 0  Altered sleeping 0 0 0  Tired, decreased energy 0 0 0  Change in appetite 0 0 0  Feeling bad or  failure about yourself  0 0 0  Trouble concentrating 0 0 0  Moving slowly or fidgety/restless 0 0 0  Suicidal thoughts  0 0  PHQ-9 Score 0 0  0   Difficult doing work/chores   Not difficult at all     Data saved with a previous flowsheet row definition        02/29/2024   11:02 AM 01/04/2024    8:46 AM 09/15/2023   10:25 AM 07/13/2023    3:35 PM  GAD 7 : Generalized Anxiety Score  Nervous, Anxious, on Edge 0 0 0 0  Control/stop worrying 0 0 0 0  Worry too much - different things 0 0 0 0  Trouble relaxing 0 0 0 0  Restless 0 0 0 0  Easily annoyed or irritable 0 0 0 0  Afraid - awful might happen 0 0 0 0  Total GAD 7 Score 0 0 0 0  Anxiety Difficulty   Not difficult at all Not difficult at all    Assessment and Plan:  Pregnancy: [id number] at [redacted]w[redacted]d 1. Supervision of high risk pregnancy, antepartum (Primary) Patient is doing well without complaints  2. Obesity affecting pregnancy, antepartum, unspecified obesity type   3. HIV affecting pregnancy, antepartum Continue current medication Patient has not been able to return to ID office for repeat viral load given recent VL 390 on 02/29/24 and it previously being undetectable. Patient admits to missing a few doses of her medication Scheduled for IOL on 03/19/24  4.  History of herpes genitalis Continue suppressive therapy with Valtrex    5. Anemia during pregnancy in third trimester S/p iron  infusion   6. Grand multiparity   Term labor symptoms and general obstetric precautions including but not limited to vaginal bleeding, contractions, leaking of fluid and fetal movement were reviewed in detail with the patient. Please refer to After Visit Summary for other counseling recommendations.   Return in about 6 weeks (around 04/25/2024) for postpartum.  Future Appointments  Date Time Provider Department Center  03/19/2024  6:45 AM [facility] SCHED ROOM [facility] None    Winton Felt, MD

## 2024-03-18 ENCOUNTER — Encounter (HOSPITAL_COMMUNITY): Payer: Self-pay | Admitting: Obstetrics and Gynecology

## 2024-03-18 ENCOUNTER — Other Ambulatory Visit: Payer: Self-pay

## 2024-03-18 ENCOUNTER — Inpatient Hospital Stay (HOSPITAL_COMMUNITY)
Admission: AD | Admit: 2024-03-18 | Discharge: 2024-03-20 | DRG: 806 | Disposition: A | Discharge status: Home / Self Care | Attending: Obstetrics and Gynecology | Admitting: Obstetrics and Gynecology

## 2024-03-18 ENCOUNTER — Telehealth: Payer: Self-pay | Admitting: Infectious Diseases

## 2024-03-18 ENCOUNTER — Encounter (HOSPITAL_COMMUNITY): Payer: Self-pay

## 2024-03-18 DIAGNOSIS — Z7951 Long term (current) use of inhaled steroids: Secondary | ICD-10-CM

## 2024-03-18 DIAGNOSIS — O9872 Human immunodeficiency virus [HIV] disease complicating childbirth: Secondary | ICD-10-CM | POA: Diagnosis present

## 2024-03-18 DIAGNOSIS — Z833 Family history of diabetes mellitus: Secondary | ICD-10-CM

## 2024-03-18 DIAGNOSIS — Z8619 Personal history of other infectious and parasitic diseases: Secondary | ICD-10-CM | POA: Diagnosis present

## 2024-03-18 DIAGNOSIS — Z30017 Encounter for initial prescription of implantable subdermal contraceptive: Secondary | ICD-10-CM | POA: Diagnosis not present

## 2024-03-18 DIAGNOSIS — Z91040 Latex allergy status: Secondary | ICD-10-CM | POA: Diagnosis not present

## 2024-03-18 DIAGNOSIS — Z3A39 39 weeks gestation of pregnancy: Secondary | ICD-10-CM

## 2024-03-18 DIAGNOSIS — Z880 Allergy status to penicillin: Secondary | ICD-10-CM

## 2024-03-18 DIAGNOSIS — Z8249 Family history of ischemic heart disease and other diseases of the circulatory system: Secondary | ICD-10-CM

## 2024-03-18 DIAGNOSIS — O98719 Human immunodeficiency virus [HIV] disease complicating pregnancy, unspecified trimester: Principal | ICD-10-CM | POA: Diagnosis present

## 2024-03-18 DIAGNOSIS — B20 Human immunodeficiency virus [HIV] disease: Secondary | ICD-10-CM | POA: Diagnosis present

## 2024-03-18 DIAGNOSIS — O26893 Other specified pregnancy related conditions, third trimester: Secondary | ICD-10-CM | POA: Diagnosis not present

## 2024-03-18 DIAGNOSIS — O9832 Other infections with a predominantly sexual mode of transmission complicating childbirth: Secondary | ICD-10-CM | POA: Diagnosis not present

## 2024-03-18 DIAGNOSIS — A6000 Herpesviral infection of urogenital system, unspecified: Secondary | ICD-10-CM | POA: Diagnosis present

## 2024-03-18 DIAGNOSIS — Z641 Problems related to multiparity: Secondary | ICD-10-CM

## 2024-03-18 LAB — CBC
HCT: 38.4 % (ref 36.0–46.0)
Hemoglobin: 12.5 g/dL (ref 12.0–15.0)
MCH: 27.4 pg (ref 26.0–34.0)
MCHC: 32.6 g/dL (ref 30.0–36.0)
MCV: 84.2 fL (ref 80.0–100.0)
Platelets: 202 K/uL (ref 150–400)
RBC: 4.56 MIL/uL (ref 3.87–5.11)
RDW: 17 % — ABNORMAL HIGH (ref 11.5–15.5)
WBC: 10.8 K/uL — ABNORMAL HIGH (ref 4.0–10.5)
nRBC: 0 % (ref 0.0–0.2)

## 2024-03-18 LAB — TYPE AND SCREEN
ABO/RH(D): B POS
Antibody Screen: NEGATIVE

## 2024-03-18 MED ORDER — TETANUS-DIPHTH-ACELL PERTUSSIS 5-2-15.5 LF-MCG/0.5 IM SUSP: 0.5000 mL | Freq: Once | INTRAMUSCULAR | Status: DC

## 2024-03-18 MED ORDER — ONDANSETRON HCL 4 MG/2ML IJ SOLN: 4.0000 mg | INTRAMUSCULAR | Status: DC | PRN

## 2024-03-18 MED ORDER — TERBUTALINE SULFATE 1 MG/ML IJ SOLN: 0.2500 mg | Freq: Once | INTRAMUSCULAR | Status: DC | PRN

## 2024-03-18 MED ORDER — MISOPROSTOL 50MCG HALF TABLET: 50.0000 ug | ORAL_TABLET | Freq: Once | ORAL | Status: DC

## 2024-03-18 MED ORDER — FENTANYL CITRATE (PF) 100 MCG/2ML IJ SOLN: 50.0000 ug | INTRAMUSCULAR | Status: DC | PRN

## 2024-03-18 MED ORDER — HYDROXYZINE HCL 50 MG PO TABS: 50.0000 mg | ORAL_TABLET | Freq: Four times a day (QID) | ORAL | Status: DC | PRN

## 2024-03-18 MED ORDER — OXYTOCIN-SODIUM CHLORIDE 30-0.9 UT/500ML-% IV SOLN: 1.0000 m[IU]/min | INTRAVENOUS | Status: DC

## 2024-03-18 MED ORDER — SIMETHICONE 80 MG PO CHEW: 80.0000 mg | CHEWABLE_TABLET | ORAL | Status: DC | PRN

## 2024-03-18 MED ORDER — ACETAMINOPHEN 325 MG PO TABS: 650.0000 mg | ORAL_TABLET | ORAL | Status: DC | PRN

## 2024-03-18 MED ORDER — ONDANSETRON HCL 4 MG PO TABS: 4.0000 mg | ORAL_TABLET | ORAL | Status: DC | PRN

## 2024-03-18 MED ORDER — COCONUT OIL OIL: 1.0000 | TOPICAL_OIL | Status: DC | PRN

## 2024-03-18 MED ORDER — TRANEXAMIC ACID-NACL 1000-0.7 MG/100ML-% IV SOLN
1000.0000 mg | INTRAVENOUS | Status: AC
  Administered 2024-03-18: 1000 mg via INTRAVENOUS
  Filled 2024-03-18: qty 100

## 2024-03-18 MED ORDER — LACTATED RINGERS IV SOLN: 500.0000 mL | INTRAVENOUS | Status: DC | PRN

## 2024-03-18 MED ORDER — DIBUCAINE (PERIANAL) 1 % EX OINT: 1.0000 | TOPICAL_OINTMENT | CUTANEOUS | Status: DC | PRN

## 2024-03-18 MED ORDER — ONDANSETRON HCL 4 MG/2ML IJ SOLN: 4.0000 mg | Freq: Four times a day (QID) | INTRAMUSCULAR | Status: DC | PRN

## 2024-03-18 MED ORDER — OXYTOCIN-SODIUM CHLORIDE 30-0.9 UT/500ML-% IV SOLN
2.5000 [IU]/h | INTRAVENOUS | Status: DC
  Filled 2024-03-18: qty 500

## 2024-03-18 MED ORDER — METHYLERGONOVINE MALEATE 0.2 MG/ML IJ SOLN
0.2000 mg | Freq: Once | INTRAMUSCULAR | Status: DC
  Filled 2024-03-18: qty 1

## 2024-03-18 MED ORDER — LACTATED RINGERS IV SOLN: INTRAVENOUS | Status: DC

## 2024-03-18 MED ORDER — FLEET ENEMA RE ENEM: 1.0000 | ENEMA | Freq: Every day | RECTAL | Status: DC | PRN

## 2024-03-18 MED ORDER — BENZOCAINE-MENTHOL 20-0.5 % EX AERO
1.0000 | INHALATION_SPRAY | CUTANEOUS | Status: DC | PRN
  Administered 2024-03-20: 1 via TOPICAL
  Filled 2024-03-18: qty 56

## 2024-03-18 MED ORDER — DOLUTEGRAVIR SODIUM 50 MG PO TABS
50.0000 mg | ORAL_TABLET | Freq: Every day | ORAL | Status: DC
  Administered 2024-03-18 – 2024-03-19 (×2): 50 mg via ORAL
  Filled 2024-03-18 (×2): qty 1

## 2024-03-18 MED ORDER — OXYTOCIN BOLUS FROM INFUSION
333.0000 mL | Freq: Once | INTRAVENOUS | Status: AC
  Administered 2024-03-18: 333 mL via INTRAVENOUS

## 2024-03-18 MED ORDER — MISOPROSTOL 25 MCG QUARTER TABLET: 25.0000 ug | ORAL_TABLET | Freq: Once | ORAL | Status: DC

## 2024-03-18 MED ORDER — WITCH HAZEL-GLYCERIN EX PADS: 1.0000 | MEDICATED_PAD | CUTANEOUS | Status: DC | PRN

## 2024-03-18 MED ORDER — ZOLPIDEM TARTRATE 5 MG PO TABS: 5.0000 mg | ORAL_TABLET | Freq: Every evening | ORAL | Status: DC | PRN

## 2024-03-18 MED ORDER — EMTRICITABINE-TENOFOVIR AF 200-25 MG PO TABS
1.0000 | ORAL_TABLET | Freq: Every day | ORAL | Status: DC
  Administered 2024-03-18 – 2024-03-19 (×2): 1 via ORAL
  Filled 2024-03-18 (×2): qty 1

## 2024-03-18 MED ORDER — PRENATAL MULTIVITAMIN CH
1.0000 | ORAL_TABLET | Freq: Every day | ORAL | Status: DC
  Administered 2024-03-19 – 2024-03-20 (×2): 1 via ORAL
  Filled 2024-03-18 (×2): qty 1

## 2024-03-18 MED ORDER — LIDOCAINE HCL (PF) 1 % IJ SOLN: 30.0000 mL | INTRAMUSCULAR | Status: DC | PRN

## 2024-03-18 MED ORDER — DIPHENHYDRAMINE HCL 25 MG PO CAPS: 25.0000 mg | ORAL_CAPSULE | Freq: Four times a day (QID) | ORAL | Status: DC | PRN

## 2024-03-18 MED ORDER — IBUPROFEN 600 MG PO TABS
600.0000 mg | ORAL_TABLET | Freq: Four times a day (QID) | ORAL | Status: DC
  Administered 2024-03-18 – 2024-03-20 (×7): 600 mg via ORAL
  Filled 2024-03-18 (×7): qty 1

## 2024-03-18 MED ORDER — SENNOSIDES-DOCUSATE SODIUM 8.6-50 MG PO TABS
2.0000 | ORAL_TABLET | Freq: Every day | ORAL | Status: DC
  Administered 2024-03-19 – 2024-03-20 (×2): 2 via ORAL
  Filled 2024-03-18 (×2): qty 2

## 2024-03-18 MED ORDER — SOD CITRATE-CITRIC ACID 500-334 MG/5ML PO SOLN: 30.0000 mL | ORAL | Status: DC | PRN

## 2024-03-18 NOTE — MAU Note (Signed)
 Patient fundal rubbed by nurse- Hunter Stanley, RN with 2nd nurse present (myself). Patient with small amount of clots present during rub, but  firm 1 below umbilicus. EBL: 86. Patient with 2 person assist to bathroom. While sitting on commode patient had a vagal episode. MD called to room for evaluation. Patient with return of consciousness about a minute to a minute in a half later. Bolus started during episode. Patient was returned to bed using a steady. Patient was fundal rubbed again, with minimal bleeding. BP 112/63 at 2045.

## 2024-03-18 NOTE — H&P (Signed)
 OBSTETRIC ADMISSION HISTORY AND PHYSICAL  Erica Walton is a 28 y.o. female [phone] with IUP at [redacted]w[redacted]d by LMP presenting for spontaneous onset of labor. She reports +FMs, No LOF, no VB, no blurry vision, headaches or peripheral edema, and RUQ pain.  She plans on breast feeding. She request Nexplanon  inpatient for birth control. She received her prenatal care at [facility]   Dating: By LMP --->  Estimated Date of Delivery: 03/25/24  NURSING  PROVIDER  Office Location [facility] Dating by LMP c/w U/S at 9 wks  [facility] Model Traditional Anatomy U/S normal  Initiated care at  [employer]  English              LAB RESULTS   Support Person FOB Genetics NIPS: LR female AFP:     Carrier Screen Horizon:   Rhogam  B/Positive/-- (05/28 1057) A1C/GTT Early HgbA1C: 4.9 Third trimester 2 hr GTT: neg  Flu Vaccine 01/19/24    TDaP Vaccine 01/19/24 Blood Type B/Positive/-- (05/28 1057)  RSV Vaccine 02/29/24 Antibody Negative (05/28 1057)  COVID Vaccine  Rubella 1.84 (05/28 1057) IMMUNE  Feeding Plan breast RPR Non Reactive (05/28 1057)  Contraception IPP Nexplanon  HBsAg Negative (05/28 1057)  Circumcision N/A HIV Preliminary Reactive (05/28 1057)  Pediatrician   HCVAb Non Reactive (05/28 1057)  Prenatal Classes     BTL Consent  Pap Diagnosis  Date Value Ref Range Status  07/13/2023   Final   - Negative for intraepithelial lesion or malignancy (NILM)    BTL Pre-payment  GC/CT Initial:  neg/neg 36wks:    VBAC Consent  GBS   Has PCN allergy, will check sensitivities   BRx Optimized? [ ]  yes   [ ]  no    DME Rx [ ]  BP cuff [ ]  Weight Scale Waterbirth  [ ]  Class [ ]  Consent [ ]  CNM visit  PHQ9 & GAD7 [  ] new OB [  ] 28 weeks  [  ] 36 weeks Induction  [ ]  Orders Entered [ ] Foley Y/N   Prenatal History/Complications: HIV, HSV on suppression, grand multiparity  Past Medical History: Past Medical History:  Diagnosis Date   Asthma    Chronic bilateral low back pain without  sciatica 10/05/2022   Dyshidrotic eczema 06/16/2021   Family history of Wilson's disease 06/23/2021   Genital herpes    Herpes 10/06/2013   PPX at 36 wks     HIV (human immunodeficiency virus infection) (HCC)    HIV positive (HCC) 06/16/2021   Hypolipidemia    Mixed hyperlipidemia 06/23/2021   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    Past Surgical History: Past Surgical History:  Procedure Laterality Date   APPENDECTOMY     LAPAROSCOPIC APPENDECTOMY N/A 07/22/2019   Procedure: APPENDECTOMY LAPAROSCOPIC;  Surgeon: Rodolph Romano, MD;  Location: [facility] ORS;  Service: General;  Laterality: N/A;    Obstetrical History: OB History     Gravida  7   Para  6   Term  6   Preterm      AB      Living  6      SAB      IAB      Ectopic      Multiple  0   Live Births  6           Social History Social History   Socioeconomic History   Marital status: Married  Spouse name: Erica Walton    Number of children: 4   Years of education: Not on file   Highest education level: Not on file  Occupational History   Not on file  Tobacco Use   Smoking status: Never   Smokeless tobacco: Never  Vaping Use   Vaping status: Never Used  Substance and Sexual Activity   Alcohol use: Not Currently    Comment: 2018   Drug use: Never   Sexual activity: Yes    Birth control/protection: None    Comment: DECL[place]ED [place]DOM[place]  Other Topics Concern   Not on file  Social History Narrative   Not on file   Social Drivers of Health   Financial Resource Strain: Low Risk  (06/18/2022)   Overall Financial Resource Strain (CARDIA)    Difficulty of Paying Living Expenses: Not hard at all  Food Insecurity: No Food Insecurity (06/18/2022)   Hunger Vital Sign    Worried About Running Out of Food in the Last Year: Never true    Ran Out of Food in the Last Year: Never true  Transportation Needs: No Transportation Needs (06/18/2022)   PRAPARE - Administrator, Civil Service  (Medical): No    Lack of Transportation (Non-Medical): No  Physical Activity: Not on file  Stress: No Stress Concern Present (10/15/2022)   [employer] of Occupational Health - Occupational Stress Questionnaire    Feeling of Stress : Only a little  Social Connections: Not on file    Family History: Family History  Problem Relation Age of Onset   Asthma Mother    Wilson's disease Mother    Obesity Father        history of addiction   Hypertension Father    Asthma Father    Cancer Father    Leukemia Father        CML   Asthma Sister    Asthma Brother    Diabetes Maternal Uncle    Obesity Maternal Grandmother    Diabetes Maternal Grandmother    Asthma Maternal Grandmother    Obesity Paternal Grandfather    Hypertension Paternal Grandfather    Heart disease Paternal Grandfather    Cancer Paternal Grandfather    Uterine cancer Maternal Great-grandmother    Breast cancer Other    Stroke Neg Hx     Allergies: Allergies  Allergen Reactions   Latex Rash   Norco [Hydrocodone -Acetaminophen ] Rash   Penicillins Rash and Other (See Comments)    Has patient had a PCN reaction causing immediate rash, facial/tongue/throat swelling, SOB or lightheadedness with hypotension: yes, Mjdy:[id number]} Has patient had a PCN reaction causing severe rash involving mucus membranes or skin necrosis: no:[id number]} Has patient had a PCN reaction that required hospitalization no:[id number]} Has patient had a PCN reaction occurring within the last 10 years: no:[id number]} If all of the above answers are NO, then may proceed with Cephalosporin use.     Medications Prior to Admission  Medication Sig Dispense Refill Last Dose/Taking   valACYclovir  (VALTREX ) 1000 MG tablet Take 1 tablet (1,000 mg total) by mouth daily. 30 tablet 4 03/18/2024   acetaminophen  (TYLENOL ) 325 MG tablet Take 2 tablets (650 mg total) by mouth every 6 (six) hours as needed. 36 tablet 0    bictegravir-emtricitabine -tenofovir   AF (BIKTARVY ) 50-200-25 MG TABS tablet Take 1 tablet by mouth daily. 30 tablet 11    budesonide -formoterol  (SYMBICORT ) 80-4.5 MCG/ACT inhaler Inhale 2 puffs into the lungs 2 (two) times daily. 1 each 3  Misc. Devices (BREAST PUMP) MISC Dispense one breast pump for patient 1 each 0    Prenatal Vit-Fe Phos-FA-Omega (VITAFOL  GUMMIES) 3.33-0.333-34.8 MG CHEW Chew 1 tablet by mouth daily. 90 tablet 5      Review of Systems   All systems reviewed and negative except as stated in HPI  Blood pressure 133/76, pulse 90, temperature 98.3 F (36.8 C), resp. rate 20, last menstrual period 06/19/2023, SpO2 95%, not currently breastfeeding. General appearance: alert, cooperative, and no distress Lungs: clear to auscultation bilaterally Heart: regular rate and rhythm Abdomen: soft, non-tender; bowel sounds normal Pelvic: n/a Extremities: Homans sign is negative, no sign of DVT DTR's +2 Presentation: cephalic Fetal monitoringBaseline: 140 bpm, Variability: Good {> 6 bpm), Accelerations: Reactive, and Decelerations: Absent Uterine activityFrequency: Every 2-3 minutes     Prenatal labs: ABO, Rh: --/--/PENDING (11/29 1850) Antibody: PENDING (11/29 1850) Rubella: 1.84 (05/28 1057) RPR: Non Reactive (09/16 0822)  HBsAg: Negative (05/28 1057)  HIV: Preliminary Reactive (05/28 1057)  GBS: Negative/-- (11/11 1120)    Lab Results  Component Value Date   GBS Negative 02/29/2024   Immunization History  Administered Date(s) Administered    sv, Bivalent, Protein Subunit Rsvpref,pf (Abrysvo) 02/29/2024   DTaP 12/10/1997, 01/30/2000, 06/22/2001, 01/17/2002   HIB (PRP-T) 12/10/1997   HPV Quadrivalent 12/24/2006, 05/28/2010   Hepatitis A, Ped/Adol-2 Dose 05/28/2010   Hepatitis B 06/22/2001, 01/17/2002, 08/02/2003   IPV 12/10/1997, 01/30/2000, 06/22/2001, 01/17/2002   Influenza, Seasonal, Injecte, Preservative Fre 01/12/2012, 01/19/2024   Influenza,inj,Quad PF,6+ Mos 02/02/2013, 02/25/2017,  04/01/2018, 01/03/2020, 01/27/2021, 03/02/2022   Influenza-Unspecified 02/25/2017   MMR 01/30/2000, 06/22/2001, 06/01/2013, 11/06/2018   Meningococcal Conjugate 12/24/2006   Pneumococcal Conjugate-13 03/27/2021   Tdap 12/24/2006, 10/30/2011, 03/29/2012, 03/20/2013, 09/30/2017, 11/06/2018, 01/24/2020, 04/07/2022, 01/19/2024   Varicella 01/17/2002, 05/28/2010    Prenatal Transfer Tool  Maternal Diabetes: No Genetic Screening: Normal Maternal Ultrasounds/Referrals: Normal Fetal Ultrasounds or other Referrals:  Referred to Materal Fetal Medicine  Maternal Substance Abuse:  No Significant Maternal Medications:  None Significant Maternal Lab Results: Group B Strep negative Number of Prenatal Visits:greater than 3 verified prenatal visits  Results for orders placed or performed during the hospital encounter of 03/18/24 (from the past 24 hours)  CBC   Collection Time: 03/18/24  6:50 PM  Result Value Ref Range   WBC 10.8 (H) 4.0 - 10.5 K/uL   RBC 4.56 3.87 - 5.11 MIL/uL   Hemoglobin 12.5 12.0 - 15.0 g/dL   HCT 61.5 63.9 - 53.9 %   MCV 84.2 80.0 - 100.0 fL   MCH 27.4 26.0 - 34.0 pg   MCHC 32.6 30.0 - 36.0 g/dL   RDW 82.9 (H) 88.4 - 84.4 %   Platelets 202 150 - 400 K/uL   nRBC 0.0 0.0 - 0.2 %  Type and screen   Collection Time: 03/18/24  6:50 PM  Result Value Ref Range   ABO/RH(D) PENDING    Antibody Screen PENDING    Sample Expiration      03/21/2024,2359 Performed at [facility], [address], [place], [place] [address]     Patient Active Problem List   Diagnosis Date Noted   Normal labor 03/18/2024   History of herpes genitalis 02/15/2024   BMI 35.0-35.9,adult 01/04/2024   Grand multiparity 01/04/2024   HIV affecting pregnancy, antepartum 10/25/2023   Obesity affecting pregnancy, antepartum 10/21/2023   Asymptomatic bacteriuria during pregnancy 09/20/2023   Supervision of high risk pregnancy, antepartum 08/24/2023   Anemia during pregnancy in third trimester  04/10/2022  Asthma affecting pregnancy in third trimester 11/17/2011   Human immunodeficiency virus (HIV) disease (HCC) 11/17/2011    Assessment/Plan:  MEARL HAREWOOD is a 28 y.o. H2E3993 at [redacted]w[redacted]d here for spontaneous onset of labor. She delivered precipitously in MAU.  Patient with known HIV and undetectable until 02/29/24. See result note. Unable to make any plan of care regarding HIV before delivery. Peds notified at delivery. Infectious disease notified to make plan of care.   #Labor:expectant management #Pain: N/a #FWB: Cat 1 #GBS status:  negative #Feeding: Breastmilk  #Reproductive Life planning: Nexplanon  #Circ:  not applicable  Aleck CHRISTELLA Fireman, CNM  03/18/2024, 7:23 PM

## 2024-03-18 NOTE — Progress Notes (Signed)
 Dr. Epifanio called back from ID. MD recommends switching patient back to pre pregnancy regimen of Tivicay  and Descovy . Will update if viral load remains elevated.   Erica Walton 03/18/24

## 2024-03-18 NOTE — MAU Note (Signed)
.  Erica Walton is a 27 y.o. at [redacted]w[redacted]d here in MAU reporting: ctX for a couple days no LOF, no vag bleed +FM  Pain score: 8/10 There were no vitals filed for this visit.   FHT:[id number] Lab orders placed from triage:   admission   7 cm in triage

## 2024-03-18 NOTE — Lactation Note (Signed)
 This note was copied from a baby's chart. Lactation Consultation Note  Patient Name: Erica Walton Date: 03/18/2024 Age:30 hours Reason for consult: Initial assessment;Term  P7. Mom is going to pump and store milk until she gets results of her blood work for her viral load results. Since mom has HIV baby isn't to get formula or mom's Brest milk or colostrum until cleared by MD. Donor breast milk ordered from NICU d/t DBN is expired on MBU. NICU sent up the milk for mom to use. LC instructed mom on how to warm the milk and put only the amount she is going to give the baby to take at that feeding in a bottle to give the baby after setting in warm water. The rest of the milk stays in the refrigerator.  LC set up DEBP. Mom shown how to use DEBP & how to disassemble, clean, & reassemble parts. Mom states understanding. Mom had pump. Baby is very sleepy. Hasn't fed. LC tried to wake baby up to eat. It was difficult. Baby finally started taking the DBM and took 10 ml w/LC feeding her. Gave the baby to mom and suggested mom attempt to get baby to the other 5 ml in bottle. Let baby rest and the try before hr. Is up.  Newborn feeding habits, STS, I&O, milk storage, pumping reviewed. Mom encouraged to feed baby 8-12 times/24 hours and with feeding cues.  Encouraged mom to call for assistance as needed.  Maternal Data Does the patient have breastfeeding experience prior to this delivery?: Yes  Feeding Mother's Current Feeding Choice:  (Donor milk) Nipple Type: Slow - flow  LATCH Score                    Lactation Tools Discussed/Used Tools: Pump;Flanges Flange Size: 18 Breast pump type: Double-Electric Breast Pump Pump Education: Milk Storage;Setup, frequency, and cleaning Reason for Pumping: pump for stimulation Pumping frequency: q 3hr  Interventions Interventions: DEBP;Education;LC Services brochure;CDC milk storage guidelines  Discharge Pump: DEBP  (motiff)  Consult Status Consult Status: Follow-up Date: 03/19/24 Follow-up type: In-patient    Ibrohim Simmers G 03/18/2024, 11:36 PM

## 2024-03-18 NOTE — Telephone Encounter (Signed)
 Called by [facility] gyn regarding precipitous delivery upon arrival Noted after delivery to have detectable viral load of 390 02/29/24. Has been on biktarvy  throughout most of pregnancy and reported good compliance.   Repeat viral load pending Resume prior ART regimen - was on tivicay  and descovy  before changing to biktarvy  in April 2025.  Infant being evaluated by pediatrics.  FU ID clinic.

## 2024-03-19 ENCOUNTER — Inpatient Hospital Stay (HOSPITAL_COMMUNITY): Admission: RE | Admit: 2024-03-19 | Source: Home / Self Care

## 2024-03-19 ENCOUNTER — Inpatient Hospital Stay (HOSPITAL_COMMUNITY)

## 2024-03-19 DIAGNOSIS — Z30017 Encounter for initial prescription of implantable subdermal contraceptive: Secondary | ICD-10-CM

## 2024-03-19 LAB — CBC
HCT: 30.9 % — ABNORMAL LOW (ref 36.0–46.0)
Hemoglobin: 10.3 g/dL — ABNORMAL LOW (ref 12.0–15.0)
MCH: 27.9 pg (ref 26.0–34.0)
MCHC: 33.3 g/dL (ref 30.0–36.0)
MCV: 83.7 fL (ref 80.0–100.0)
Platelets: 181 K/uL (ref 150–400)
RBC: 3.69 MIL/uL — ABNORMAL LOW (ref 3.87–5.11)
RDW: 16.9 % — ABNORMAL HIGH (ref 11.5–15.5)
WBC: 13.3 K/uL — ABNORMAL HIGH (ref 4.0–10.5)
nRBC: 0 % (ref 0.0–0.2)

## 2024-03-19 LAB — SYPHILIS: RPR W/REFLEX TO RPR TITER AND TREPONEMAL ANTIBODIES, TRADITIONAL SCREENING AND DIAGNOSIS ALGORITHM: RPR Ser Ql: NONREACTIVE

## 2024-03-19 MED ORDER — LIDOCAINE HCL 1 % IJ SOLN
0.0000 mL | Freq: Once | INTRAMUSCULAR | Status: AC | PRN
  Administered 2024-03-19: 20 mL via INTRADERMAL
  Filled 2024-03-19 (×2): qty 20

## 2024-03-19 MED ORDER — ETONOGESTREL 68 MG ~~LOC~~ IMPL
68.0000 mg | DRUG_IMPLANT | Freq: Once | SUBCUTANEOUS | Status: AC
  Administered 2024-03-19: 68 mg via SUBCUTANEOUS
  Filled 2024-03-19 (×2): qty 1

## 2024-03-19 MED ORDER — PNEUMOCOCCAL 20-VAL CONJ VACC 0.5 ML IM SUSY
0.5000 mL | PREFILLED_SYRINGE | Freq: Once | INTRAMUSCULAR | Status: AC
  Administered 2024-03-19: 0.5 mL via INTRAMUSCULAR
  Filled 2024-03-19: qty 0.5

## 2024-03-19 MED ORDER — BICTEGRAVIR-EMTRICITAB-TENOFOV 50-200-25 MG PO TABS
1.0000 | ORAL_TABLET | Freq: Every day | ORAL | Status: DC
  Administered 2024-03-20: 1 via ORAL
  Filled 2024-03-19: qty 1

## 2024-03-19 NOTE — Progress Notes (Signed)
 Post Partum Day 1 Subjective: Patient is doing well and is without complaints. She ambulated to the restroom. She denies chest pain, shortness of breath, lightheadedness or dizziness. She tolerated a regular diet and voided  Objective: Blood pressure 117/73, pulse 88, temperature 98.6 F (37 C), temperature source Oral, resp. rate 18, last menstrual period 06/19/2023, SpO2 98%, unknown if currently breastfeeding.  Physical Exam:  General: alert, cooperative, and no distress Lochia: appropriate Uterine Fundus: firm Incision: N/A DVT Evaluation: No evidence of DVT seen on physical exam.  Recent Labs    03/18/24 1850 03/19/24 0450  HGB 12.5 10.3*  HCT 38.4 30.9*    Assessment/Plan: Plan for discharge tomorrow, Breastfeeding, and Contraception Nexplanon  to be placed prior to discharge home   LOS: 1 day   Winton Felt, MD 03/19/2024, 12:43 PM

## 2024-03-19 NOTE — Plan of Care (Signed)
  Problem: Education: Goal: Knowledge of condition will improve Outcome: Completed/Met

## 2024-03-19 NOTE — Discharge Summary (Signed)
 Postpartum Discharge Summary  Date of Service updated***     Patient Name: Erica Walton DOB: [birth year] MRN: [id number]  Date of admission: 03/18/2024 Delivery date:03/18/2024 Delivering provider: NEILL, CAROLINE M Date of discharge: 03/19/2024  Admitting diagnosis: HIV affecting pregnancy, antepartum [O98.719] Normal labor [O80, Z37.9] Intrauterine pregnancy: [redacted]w[redacted]d     Secondary diagnosis:  Principal Problem:   HIV affecting pregnancy, antepartum Active Problems:   Grand multiparity   History of herpes genitalis   Normal labor  Additional problems: none    Discharge diagnosis: Term Pregnancy Delivered                                              Post partum procedures:Nexplanon  placed on PPD#1 Augmentation: none Complications: None  Hospital course: Onset of Labor With Vaginal Delivery      28 y.o. yo H2E2992 at [redacted]w[redacted]d was admitted in Active Labor on 03/18/2024 and delivered precipitously in MAU. She had a low viral load of 390 on 02/29/24; due to rapid labor course, there wasn't time to make a medication plan prior to delivery.  Membrane Rupture Time/Date: 7:09 PM,03/18/2024  Delivery Method:Vaginal, Spontaneous Operative Delivery:N/A Episiotomy: None Lacerations:  None Patient had a postpartum course complicated by ***.  She is ambulating, tolerating a regular diet, passing flatus, and urinating well. Patient is discharged home in stable condition on 03/19/24.  Newborn Data: Birth date:03/18/2024 Birth time:7:09 PM Gender:Female Living status:Living Apgars:9 ,9  Weight:3099 g (6lb 13.3.oz)  Magnesium Sulfate received: No BMZ received: No Rhophylac:N/A MMR:N/A T-DaP:Given prenatally Flu: Yes, prenatally RSV Vaccine received: Yes Transfusion:No  Immunizations received: Immunization History  Administered Date(s) Administered    sv, Bivalent, Protein Subunit Rsvpref,pf (Abrysvo) 02/29/2024   DTaP 12/10/1997, 01/30/2000, 06/22/2001, 01/17/2002   HIB  (PRP-T) 12/10/1997   HPV Quadrivalent 12/24/2006, 05/28/2010   Hepatitis A, Ped/Adol-2 Dose 05/28/2010   Hepatitis B 06/22/2001, 01/17/2002, 08/02/2003   IPV 12/10/1997, 01/30/2000, 06/22/2001, 01/17/2002   Influenza, Seasonal, Injecte, Preservative Fre 01/12/2012, 01/19/2024   Influenza,inj,Quad PF,6+ Mos 02/02/2013, 02/25/2017, 04/01/2018, 01/03/2020, 01/27/2021, 03/02/2022   Influenza-Unspecified 02/25/2017   MMR 01/30/2000, 06/22/2001, 06/01/2013, 11/06/2018   Meningococcal Conjugate 12/24/2006   PNEUMOCOCCAL CONJUGATE-20 03/19/2024   Pneumococcal Conjugate-13 03/27/2021   Tdap 12/24/2006, 10/30/2011, 03/29/2012, 03/20/2013, 09/30/2017, 11/06/2018, 01/24/2020, 04/07/2022, 01/19/2024   Varicella 01/17/2002, 05/28/2010    Physical exam  Vitals:   03/18/24 2232 03/19/24 0258 03/19/24 0735 03/19/24 1100  BP: 112/71 (!) 108/54 111/82 117/73  Pulse: 70 66 80 88  Resp: 18 18 16 18   Temp: 98.3 F (36.8 C) 98.3 F (36.8 C) 98.2 F (36.8 C) 98.6 F (37 C)  TempSrc: Oral Oral Oral Oral  SpO2: 100% 100% 97% 98%   General: {Exam; general:21111117} Lochia: {Desc; appropriate/inappropriate:30686::appropriate} Uterine Fundus: {Desc; firm/soft:30687} Incision: {Exam; incision:21111123} DVT Evaluation: {Exam; dvt:2111122} Labs: Lab Results  Component Value Date   WBC 13.3 (H) 03/19/2024   HGB 10.3 (L) 03/19/2024   HCT 30.9 (L) 03/19/2024   MCV 83.7 03/19/2024   PLT 181 03/19/2024      Latest Ref Rng & Units 01/04/2024    9:24 AM  CMP  Glucose 70 - 99 mg/dL 828   BUN 6 - 20 mg/dL 4   Creatinine 9.42 - 8.99 mg/dL 9.54   Sodium 865 - 855 mmol/L 135   Potassium 3.5 - 5.2 mmol/L 4.6   Chloride 96 -  106 mmol/L 103   CO2 20 - 29 mmol/L 19   Calcium  8.7 - 10.2 mg/dL 8.5   Total Protein 6.0 - 8.5 g/dL 6.1   Total Bilirubin 0.0 - 1.2 mg/dL <9.7   Alkaline Phos 41 - 116 IU/L 99   AST 0 - 40 IU/L 13   ALT 0 - 32 IU/L 9    Edinburgh Score:    03/18/2024    9:30 PM  Edinburgh  Postnatal Depression Scale Screening Tool  I have been able to laugh and see the funny side of things. 0  I have looked forward with enjoyment to things. 0  I have blamed myself unnecessarily when things went wrong. 0  I have been anxious or worried for no good reason. 0  I have felt scared or panicky for no good reason. 0  Things have been getting on top of me. 0  I have been so unhappy that I have had difficulty sleeping. 0  I have felt sad or miserable. 0  I have been so unhappy that I have been crying. 0  The thought of harming myself has occurred to me. 0  Edinburgh Postnatal Depression Scale Total 0   Edinburgh Postnatal Depression Scale Total: 0   After visit meds:  Allergies as of 03/19/2024       Reactions   Latex Rash   Norco [hydrocodone -acetaminophen ] Rash   Penicillins Rash, Other (See Comments)   Has patient had a PCN reaction causing immediate rash, facial/tongue/throat swelling, SOB or lightheadedness with hypotension: yes, Mjdy:[id number]} Has patient had a PCN reaction causing severe rash involving mucus membranes or skin necrosis: no:[id number]} Has patient had a PCN reaction that required hospitalization no:[id number]} Has patient had a PCN reaction occurring within the last 10 years: no:[id number]} If all of the above answers are NO, then may proceed with Cephalosporin use.     Med Rec must be completed prior to using this [facility]***        Discharge home in stable condition Infant Feeding: {Baby feeding:23562} Infant Disposition:{CHL IP OB HOME WITH FNUYZM:76418} Discharge instruction: per After Visit Summary and Postpartum booklet. Activity: Advance as tolerated. Pelvic rest for 6 weeks.  Diet: routine diet Future Appointments:No future appointments. Follow up Visit:  Loreli Suzen BIRCH, CNM  P [facility] Support Pool Please schedule this patient for Postpartum visit in: 6 weeks with the following provider: Any provider In-Person For C/S  patients schedule nurse incision check in weeks 2 weeks: no High risk pregnancy complicated by: HIV+ Delivery mode:  SVD Anticipated Birth Control:  PP Nexplanon  placed PP Procedures needed: none Schedule Integrated BH visit: no   03/19/2024 Suzen BIRCH Loreli, CNM

## 2024-03-19 NOTE — Procedures (Signed)
 POSTPLACENTAL NEXPLANON  INSERTION Patient name: Erica Walton MRN [id number]  Date of birth: 04/19/1996  Risks/benefits/side effects of Nexplanon  have been discussed and her questions have been answered.  Specifically, a failure rate of 04/998 has been reported, with an increased failure rate if pt takes St. John's Wort and/or antiseizure medicaitons.  She is aware of the common side effect of irregular bleeding, which the incidence of decreases over time. Signed copy of informed consent in chart.   Time out was performed.  She is right-handed, so her left arm, approximately 8-10 cm (3-4 inches) from the medial epicondyle of the humerus and 3-5 cm (1.25-2 inches) posterior to (below) the sulcus (groove) between the biceps and triceps muscle was cleansed with alcohol and anesthetized with 3cc of 1% Lidocaine .  The area was cleansed again with betadine and the Nexplanon  was inserted per manufacturer's recommendations without difficulty.  A 2x2 gauze and pressure bandage were applied. The patient tolerated the procedure well. There was minimal blood loss.   Patient was given post procedure instructions and Nexplanon  user card with expiration date. Patient was asked to keep the pressure dressing on for 24 hours to minimize bruising and keep the adhesive bandage on for 3-5 days. The patient verbalized understanding of the plan of care and agrees.   Charges entered. Exp 2027-08, Lot [id number]  Suzen JONETTA Gentry CNM 03/19/2024 1:32 PM

## 2024-03-19 NOTE — Progress Notes (Signed)
 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 CSW met with MOB to offer support and complete assessment. When CSW entered the room, MOB was observed sitting in hospital bed holding infant. CSW introduced self and explained reason for visit, MOB presented as pleasant, welcoming, and remained engaged during visit.   CSW assessed for mood and collected mental health history. MOB reports feeling good since infant's arrival. MOB acknowledged postpartum depression following the births of her 1st, 2nd, and most recently her 5th child in 2022 (MOB is a G10P7). MOB reports following the birth of her 5th child in 2022, she endorsed anxiety symptoms and was prescribed Lexapro  as treatment. MOB reports the medication was helpful and she discontinued the medication after about 3-4 months when she felt her symptoms had resolved. MOB denied additional mental health history/diagnoses. MOB identified FOB, FOB's grandmother and a close friend as supports. CSW assessed for safety, MOB denied current SI, HI , and denied domestic violence. CSW provided education regarding the baby blues period vs. perinatal mood disorders, discussed treatment and gave resources for mental health follow up if concerns arise.  CSW recommends self-evaluation during the postpartum time period using the New Mom Checklist from Postpartum Progress and encouraged MOB to contact a medical professional if symptoms are noted at any time.   CSW explained that due to MOB's HIV+ status, infant will need pediatric infectious disease follow up. MOB reports she is familiar with ID follow up, sharing she was diagnosed with HIV when she was pregnant with her first child at 28 years of age and she has taken all of her children to [facility] for follow up. MOB reports she has been in contact with [facility]'s pediatric ID social worker, Leita Dandy and Leita is aware that patient was pregnant. CSW explained that an appointment with [facility] will  likely need to be scheduled prior to infant's hospital discharge. MOB provided consent for weekday social work to speak with Leita tomorrow (12/1) during weekday hours to schedule infant's initial appointment. MOB reports FOB is aware that MOB is HIV+ and appointment details can be shared with him present.    MOB reports she is followed by [facility] for Infectious Disease and has been on Biktarvy  since 5w gestation for HIV management. MOB states she is currently feeding infant donor breast milk due to her lab work showing a detectable viral load 02/29/24. MOB states she was told by her ID provider that her results may have been a lab error but she is agreeable to feed infant donor milk or formula until her updated lab work results. MOB reports she is pumping in the mean time to establish her supply and is in agreement with care plan discussed with hospital pediatrician. MOB was knowledgeable about her and infant's treatment and possessed good insight about infant precautions. MOB denied concerns around her status or the infants plan of care.      CSW provided review of Sudden Infant Death Syndrome (SIDS) precautions.  MOB identified [facility] for infants follow up care. MOB reported she has all necessary items for the infant including a pack n play and car seat.   Weekday CSW to assist with scheduling ped ID follow up appointment prior to infant's discharge due to the clinic being closed over the weekend.  Signed,  Sharyne LOIS Roulette, MSW, LCSWA, LCASA 03/19/2024 4:58 PM

## 2024-03-20 ENCOUNTER — Other Ambulatory Visit (HOSPITAL_COMMUNITY): Payer: Self-pay

## 2024-03-20 LAB — HIV-1 RNA QUANT-NO REFLEX-BLD
HIV 1 RNA Quant: <20 {copies}/mL
LOG10 HIV-1 RNA: UNDETERMINED {Log_copies}/mL

## 2024-03-20 MED ORDER — BENZOCAINE-MENTHOL 20-0.5 % EX AERO
1.0000 | INHALATION_SPRAY | CUTANEOUS | 0 refills | Status: AC | PRN
  Filled 2024-03-20: qty 78, fill #0

## 2024-03-20 MED ORDER — IBUPROFEN 600 MG PO TABS
600.0000 mg | ORAL_TABLET | Freq: Four times a day (QID) | ORAL | 0 refills | Status: AC
  Filled 2024-03-20: qty 30, 8d supply, fill #0

## 2024-03-20 NOTE — Lactation Note (Signed)
 This note was copied from a baby's chart. Lactation Consultation Note  Patient Name: Erica Walton Date: 03/20/2024 Age:30 hours Reason for consult: Follow-up assessment;Term (HIV+ and HSV+)  P7- MOB's HIV viral load came back as less than 20, so she was given the clear to start breastfeeding by the physicians. 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 MOB understands that the regulations have changed since her diagnosis. MOB reports having supply issues with both of her two most recent children that she tried to breastfeed. MOB reports feeling more confident with her supply this time around because she has been able to express colostrum for a few months now. MOB reports that since she was cleared to latching infant, she has breast fed infant twice now. MOB denies having any pain during feedings, but reports that it is a little sore due to infant's strength.  LC reviewed the rules to breastfeeding when HIV+ to protect infant; only offer breast milk (no combo feeding) for at least the first 6 months, stay on top of ART medication, viral load must stay undetectable, stop breastfeeding if nipples are cracked/bleeding/mastitis. LC encouraged following up with outpatient LC for support and to ensure infant is latching well. LC encouraged MOB to call for a latch assessment tonight. MOB denies having any questions or concerns at this time. MOB expresses joy over infant latching so well so far.  Maternal Data Has patient been taught Hand Expression?: No Does the patient have breastfeeding experience prior to this delivery?: Yes  Feeding Mother's Current Feeding Choice: Breast Milk  Lactation Tools Discussed/Used Tools: Pump;Flanges Breast pump type: Double-Electric Breast Pump;Manual Pump Education: Setup, frequency, and cleaning;Milk Storage Pumping frequency: 15-20 min every 3  hrs  Interventions Interventions: Breast feeding basics reviewed;Hand pump;DEBP;Education;LC Services brochure  Discharge Discharge Education: Engorgement and breast care;Warning signs for feeding baby;Outpatient recommendation Pump: Hands Free;Personal  Consult Status Consult Status: Follow-up Date: 03/21/24 Follow-up type: In-patient    Recardo Hoit BS, IBCLC 03/20/2024, 5:44 PM

## 2024-03-20 NOTE — Patient Instructions (Signed)

## 2024-03-21 NOTE — Lactation Note (Signed)
 This note was copied from a baby's chart. Lactation Consultation Note  Patient Name: Erica Walton Date: 03/21/2024 Age:28 years Reason for consult: Follow-up assessment;Term  P7, Mother HIV + but viral load < 20.  Per MD mother may now breastfeed with precautions.  Mother states her R nipple has an abrasion from latching so she is not breastfeeding on the right side.  Provided mother with coconut oil.    Observed mother latch baby on the left breast.  Noted intermittent swallows.  Mother plans to pump and dump on the Right breast and will continue to give donor milk as needed while in the hospital.  Reviewed engorgement care and monitoring voids/stools. Suggest calling for assistance as needed.   Maternal Data Has patient been taught Hand Expression?: Yes  Feeding Mother's Current Feeding Choice: Breast Milk and Donor Milk  LATCH Score Latch: Grasps breast easily, tongue down, lips flanged, rhythmical sucking.  Audible Swallowing: A few with stimulation  Type of Nipple: Everted at rest and after stimulation  Comfort (Breast/Nipple): Soft / non-tender (R nipple abrasion/not latching on right)  Hold (Positioning): Assistance needed to correctly position infant at breast and maintain latch.  LATCH Score: 8   Lactation Tools Discussed/Used Tools: Pump;Flanges;Coconut oil Flange Size: 18 Pumping frequency: q 3 hours for 15 min  Interventions Interventions: Breast feeding basics reviewed;Hand express;Position options;Support pillows;Coconut oil;Education  Discharge Discharge Education: Engorgement and breast care;Warning signs for feeding baby Pump: Hands Free;Personal  Consult Status Consult Status: Complete Date: 03/21/24  Shannon Dines Boschen  RN, IBCLC 03/21/2024, 9:03 AM

## 2024-03-21 NOTE — Addendum Note (Signed)
 Addended by: MELVENIA COREAN SAILOR on: 03/21/2024 10:30 AM   Modules accepted: Orders

## 2024-03-28 ENCOUNTER — Telehealth (HOSPITAL_COMMUNITY): Payer: Self-pay | Admitting: *Deleted

## 2024-03-28 NOTE — Telephone Encounter (Signed)
 03/28/2024  Name: Erica Walton MRN: [id number] DOB: [birth year]  Reason for Call:  Transition of Care Hospital Discharge Call  Contact Status: Patient Contact Status: Complete  Language assistant needed:          Follow-Up Questions: Do You Have Any Concerns About Your Health As You Heal From Delivery?: No Do You Have Any Concerns About Your Infants Health?: No  Edinburgh Postnatal Depression Scale:  In the Past 7 Days:   Patient reported that her answers are the same as when she completed the EPDS in the hospital on 03/18/24. Score at that time was 0. Stated that she is "doing well." Also reported having good support. EPDS not completed at this time PHQ2-9 Depression Scale:     Discharge Follow-up: Edinburgh score requires follow up?: N/A Patient was advised of the following resources:: Breastfeeding Support Group, Support Group Declined email information at this time. Post-discharge interventions: Reviewed Newborn Safe Sleep Practices  Signature [name], RN, 03/28/24, [phone]

## 2024-04-22 ENCOUNTER — Other Ambulatory Visit: Payer: Self-pay | Admitting: Obstetrics & Gynecology

## 2024-04-24 ENCOUNTER — Encounter (INDEPENDENT_AMBULATORY_CARE_PROVIDER_SITE_OTHER)

## 2024-04-24 ENCOUNTER — Other Ambulatory Visit (HOSPITAL_COMMUNITY): Payer: Self-pay

## 2024-04-24 DIAGNOSIS — Z79899 Other long term (current) drug therapy: Secondary | ICD-10-CM | POA: Diagnosis not present

## 2024-04-24 NOTE — Telephone Encounter (Signed)
 Erica Walton is interested in getting back on cabenuva  injections. She has Nexplanon  in place for contraception after recent delivery. Bottle feeding her infant.   Can we look into this for her? She is on biktarvy  currently. Had one viral load > 300 that I strongly suggest was processing error as it was collected at the [facility] office.   Thank you team

## 2024-04-24 NOTE — Telephone Encounter (Signed)
 Charmaine - can we look into Cabenuva  for Adriana? Thanks!

## 2024-04-25 ENCOUNTER — Other Ambulatory Visit: Payer: Self-pay | Admitting: Pharmacist

## 2024-04-25 ENCOUNTER — Other Ambulatory Visit: Payer: Self-pay

## 2024-04-25 ENCOUNTER — Other Ambulatory Visit (HOSPITAL_COMMUNITY): Payer: Self-pay

## 2024-04-25 DIAGNOSIS — B20 Human immunodeficiency virus [HIV] disease: Secondary | ICD-10-CM

## 2024-04-25 MED ORDER — CABOTEGRAVIR & RILPIVIRINE ER 600 & 900 MG/3ML IM SUER
1.0000 | INTRAMUSCULAR | 1 refills | Status: AC
  Filled 2024-04-25: qty 6, 30d supply, fill #0

## 2024-04-25 NOTE — Telephone Encounter (Signed)
 Charmaine - I sent Cabenuva  to [facility] for her! She is coming on 1/22.

## 2024-04-25 NOTE — Progress Notes (Signed)
 Specialty Pharmacy Initial Fill Coordination Note  Erica Walton is a 29 y.o. female contacted today regarding initial fill of specialty medication(s) Cabotegravir  & Rilpivirine  (CABENUVA )   Patient requested Courier to Provider Office   Delivery date: 04/28/24   Verified address: [facility] [address] [place] [address]   Medication will be filled on: 04/26/24   Patient is aware of $0 copayment.

## 2024-04-28 ENCOUNTER — Telehealth: Payer: Self-pay

## 2024-04-28 NOTE — Telephone Encounter (Signed)
 [facility] Patient Advocate Encounter  Patient's medications CABENUVA  have been couriered to [facility] from [facility] and will be administered at the patients appointment on 05/11/24.  Charmaine Sharps, CPhT Specialty Pharmacy Patient [facility] for Infectious Disease Phone: [phone] Fax:  [phone]

## 2024-05-01 ENCOUNTER — Ambulatory Visit: Admitting: Obstetrics & Gynecology

## 2024-05-01 DIAGNOSIS — Z3046 Encounter for surveillance of implantable subdermal contraceptive: Secondary | ICD-10-CM

## 2024-05-01 DIAGNOSIS — Z975 Presence of (intrauterine) contraceptive device: Secondary | ICD-10-CM | POA: Insufficient documentation

## 2024-05-01 NOTE — Progress Notes (Signed)
 "   Post Partum Visit Note  Erica Walton is a 29 y.o. [phone] female who presents for a postpartum visit. She is 6 weeks postpartum following a normal spontaneous vaginal delivery.  I have fully reviewed the prenatal and intrapartum course, complicated by HIV which had undetectable viral loads on medications. The delivery was at 39.0 gestational weeks.  Anesthesia: none. Postpartum course has been uncomplicated. Baby is doing well. Baby is feeding by bottle - Similac Advance. Bleeding no bleeding. Bowel function is normal. Bladder function is normal. Patient is sexually active. Contraception method is Nexplanon .   Postpartum depression screening: negative.   The pregnancy intention screening data noted above was reviewed. Potential methods of contraception were discussed. The patient elected to proceed with No data recorded.   Edinburgh Postnatal Depression Scale - 05/01/24 1325       Edinburgh Postnatal Depression Scale:  In the Past 7 Days   I have been able to laugh and see the funny side of things. 0    I have looked forward with enjoyment to things. 0    I have blamed myself unnecessarily when things went wrong. 0    I have been anxious or worried for no good reason. 0    I have felt scared or panicky for no good reason. 0    Things have been getting on top of me. 0    I have been so unhappy that I have had difficulty sleeping. 0    I have felt sad or miserable. 0    I have been so unhappy that I have been crying. 0    The thought of harming myself has occurred to me. 0    Edinburgh Postnatal Depression Scale Total 0          Health Maintenance Due  Topic Date Due   COVID-19 Vaccine (1) Never done   HPV VACCINES (3 - Risk 3-dose series) 09/26/2010    The following portions of the patient's history were reviewed and updated as appropriate: allergies, current medications, past family history, past medical history, past social history, past surgical history, and problem  list.  Review of Systems Pertinent items noted in HPI and remainder of comprehensive ROS otherwise negative.  Objective:  BP 112/76   Pulse 77   Wt 154 lb (69.9 kg)   LMP 04/22/2024 (Approximate)   Breastfeeding No   BMI 31.64 kg/m    General:  alert and no distress   Breasts:  not indicated  Lungs: clear to auscultation bilaterally  Heart:  regular rate and rhythm  Abdomen: soft, non-tender; bowel sounds normal; no masses,  no organomegaly   GU exam:  not indicated       Assessment:    1. Nexplanon  in place since 03/19/24  2. Postpartum care following vaginal delivery  Plan:   Essential components of care per ACOG recommendations:  1.  Mood and well being: Patient with negative depression screening today. Reviewed local resources for support.  - Patient tobacco use? No.   - hx of drug use? No.    2. Infant care and feeding:  -Patient currently breastmilk feeding? No.  -Social determinants of health (SDOH) reviewed in EPIC. No concerns,  3. Sexuality, contraception and birth spacing - Patient does not want a pregnancy in the next year.  Desired family size is 7 children.  - Already has Nexplanon  in place, placed 03/19/24. - Discussed birth spacing of 18 months  4. Sleep and fatigue -Encouraged family/partner/community support  of 4 hrs of uninterrupted sleep to help with mood and fatigue  5. Physical Recovery  - Discussed patients delivery and complications. She describes her labor as good. - Patient had a Vaginal, no problems at delivery. Patient had no laceration. Perineal healing reviewed. Patient expressed understanding - Patient has urinary incontinence? No. - Patient is safe to resume physical and sexual activity  6.  Health Maintenance - HM due items addressed Yes - Last pap smear  Diagnosis  Date Value Ref Range Status  07/13/2023   Final   - Negative for intraepithelial lesion or malignancy (NILM)   Pap smear not done at today's visit.   7.  Chronic Disease/Pregnancy Condition follow up: HIV - ID follow up alreasdy scheduled.  Gloris Hugger, MD [facility] [employer], [facility]  "

## 2024-05-11 ENCOUNTER — Ambulatory Visit: Admitting: Pharmacist

## 2024-05-11 NOTE — Progress Notes (Unsigned)
 "  HPI: Erica Walton is a 29 y.o. female who presents to the [facility] pharmacy clinic for restart of Cabenuva  administration.   Referring ID Provider: Corean Fireman, NP  Patient Active Problem List   Diagnosis Date Noted   Nexplanon  in place since 03/19/24 05/01/2024   BMI 35.0-35.9,adult 01/04/2024   Human immunodeficiency virus (HIV) disease (HCC) 11/17/2011    Patient's Medications  New Prescriptions   No medications on file  Previous Medications   ACETAMINOPHEN  (TYLENOL ) 325 MG TABLET    Take 2 tablets (650 mg total) by mouth every 6 (six) hours as needed.   BENZOCAINE -MENTHOL  (DERMOPLAST) 20-0.5 % AERO    Apply 1 Application topically as needed for irritation (perineal discomfort).   BICTEGRAVIR-EMTRICITABINE -TENOFOVIR  AF (BIKTARVY ) 50-200-25 MG TABS TABLET    Take 1 tablet by mouth daily.   BUDESONIDE -FORMOTEROL  (SYMBICORT ) 80-4.5 MCG/ACT INHALER    Inhale 2 puffs into the lungs 2 (two) times daily.   CABOTEGRAVIR  & RILPIVIRINE  ER (CABENUVA ) 600 & 900 MG/3ML INJECTION    Inject 1 kit into the muscle every 30 (thirty) days.   IBUPROFEN  (ADVIL ) 600 MG TABLET    Take 1 tablet (600 mg total) by mouth every 6 (six) hours.   PRENATAL VIT-FE PHOS-FA-OMEGA (VITAFOL  GUMMIES) 3.33-0.333-34.8 MG CHEW    Chew 1 tablet by mouth daily.   VENTOLIN  HFA 108 (90 BASE) MCG/ACT INHALER    TAKE 2 PUFFS BY MOUTH EVERY 6 HOURS AS NEEDED FOR WHEEZE OR SHORTNESS OF BREATH  Modified Medications   No medications on file  Discontinued Medications   No medications on file    Allergies: Allergies[1]  Labs: Lab Results  Component Value Date   HIV1RNAQUANT <20 03/18/2024   HIV1RNAQUANT NOT DETECTED 07/27/2023   HIV1RNAQUANT Not Detected 06/25/2021   HIV1RNAVL 390 02/29/2024   HIV1RNAVL <20 01/04/2024   HIV1RNAVL <20 05/27/2022   CD4TABS 784 07/27/2023   CD4TABS 990 01/27/2021   CD4TABS 857 09/23/2020    RPR and STI Lab Results  Component Value Date   LABRPR NON REACTIVE 03/18/2024   LABRPR  Non Reactive 01/04/2024   LABRPR Non Reactive 09/15/2023   LABRPR NON REACTIVE 06/16/2022   LABRPR Nonreactive 06/16/2022    STI Results GC CT  02/29/2024 11:06 AM Negative  Negative   09/15/2023 10:34 AM Negative  Negative   06/15/2022  4:38 PM Negative  Negative   06/15/2022 12:00 AM Negative     Negative      12/08/2021  4:13 PM Negative  Negative   12/08/2021 12:00 AM Negative     Negative      05/02/2020  4:40 PM Negative  Negative   03/20/2020  3:29 PM Negative  Negative   09/06/2019  3:45 PM Negative  Negative   07/17/2019  9:30 AM Negative  Negative      This result is from an external source.    Hepatitis B Lab Results  Component Value Date   HEPBSAB REACTIVE (A) 07/17/2019   HEPBSAG Negative 09/15/2023   HEPBCAB NON-REACTIVE 07/17/2019   Hepatitis C Lab Results  Component Value Date   HEPCAB NON-REACTIVE 07/17/2019   Hepatitis A Lab Results  Component Value Date   HAV NON-REACTIVE 07/17/2019   Lipids: Lab Results  Component Value Date   CHOL 161 07/27/2023   TRIG 231 (H) 07/27/2023   HDL 44 (L) 07/27/2023   CHOLHDL 3.7 07/27/2023   LDLCALC 85 07/27/2023    Target Date: 22nd  Assessment: Erica Walton presents today for restart of Cabenuva   injections. Previous injections were tolerated well without issues. She has been on Biktarvy  during her recent pregnancy. She has Nexlanon in place for contraception. Last HIV RNA was undetectable in November. Doing well with no issues today.  Lab work:  No labs today  Eligible vaccinations:  Shingrix, Menveo  Cabenuva : Administered cabotegravir  600mg /75mL in left upper outer quadrant of the gluteal muscle. Administered rilpivirine  900 mg/3mL in the right upper outer quadrant of the gluteal muscle. No issues with injections. Erica Walton will follow up in 1 month for next set of injections.  Counseled that Erica Walton can stop taking her Biktarvy  after this set of injections. ***  Plan: - Cabenuva  injections  administered - Next injections scheduled for *** [ 1 month from now ] - Call with any issues or questions  Maurilio Patten, PharmD PGY1 Pharmacy Resident [facility] 05/11/2024     [1]  Allergies Allergen Reactions   Latex Rash   Norco [Hydrocodone -Acetaminophen ] Rash   Penicillins Rash and Other (See Comments)    Has patient had a PCN reaction causing immediate rash, facial/tongue/throat swelling, SOB or lightheadedness with hypotension: yes, Mjdy:[id number]} Has patient had a PCN reaction causing severe rash involving mucus membranes or skin necrosis: no:[id number]} Has patient had a PCN reaction that required hospitalization no:[id number]} Has patient had a PCN reaction occurring within the last 10 years: no:[id number]} If all of the above answers are NO, then may proceed with Cephalosporin use.    "
